# Patient Record
Sex: Male | Born: 1945 | Race: White | Hispanic: No | Marital: Married | Smoking: Former smoker
Health system: Southern US, Community
[De-identification: ages and names within clinical notes are randomized; demographics above are authoritative.]

## PROBLEM LIST (undated history)

## (undated) DIAGNOSIS — M1990 Unspecified osteoarthritis, unspecified site: Secondary | ICD-10-CM

## (undated) DIAGNOSIS — E785 Hyperlipidemia, unspecified: Secondary | ICD-10-CM

## (undated) DIAGNOSIS — Z9289 Personal history of other medical treatment: Secondary | ICD-10-CM

## (undated) DIAGNOSIS — R002 Palpitations: Secondary | ICD-10-CM

## (undated) DIAGNOSIS — I441 Atrioventricular block, second degree: Secondary | ICD-10-CM

## (undated) DIAGNOSIS — N401 Enlarged prostate with lower urinary tract symptoms: Secondary | ICD-10-CM

## (undated) DIAGNOSIS — I472 Ventricular tachycardia, unspecified: Secondary | ICD-10-CM

## (undated) DIAGNOSIS — F419 Anxiety disorder, unspecified: Secondary | ICD-10-CM

## (undated) DIAGNOSIS — G4700 Insomnia, unspecified: Secondary | ICD-10-CM

## (undated) DIAGNOSIS — Z87828 Personal history of other (healed) physical injury and trauma: Secondary | ICD-10-CM

## (undated) DIAGNOSIS — I480 Paroxysmal atrial fibrillation: Secondary | ICD-10-CM

## (undated) DIAGNOSIS — N138 Other obstructive and reflux uropathy: Secondary | ICD-10-CM

## (undated) DIAGNOSIS — I493 Ventricular premature depolarization: Secondary | ICD-10-CM

## (undated) DIAGNOSIS — I10 Essential (primary) hypertension: Secondary | ICD-10-CM

## (undated) DIAGNOSIS — Z9849 Cataract extraction status, unspecified eye: Secondary | ICD-10-CM

## (undated) DIAGNOSIS — S42009A Fracture of unspecified part of unspecified clavicle, initial encounter for closed fracture: Secondary | ICD-10-CM

## (undated) HISTORY — DX: Personal history of other (healed) physical injury and trauma: Z87.828

## (undated) HISTORY — DX: Unspecified osteoarthritis, unspecified site: M19.90

## (undated) HISTORY — PX: HERNIA REPAIR: SHX51

## (undated) HISTORY — DX: Ventricular premature depolarization: I49.3

## (undated) HISTORY — DX: Cataract extraction status, unspecified eye: Z98.49

## (undated) HISTORY — DX: Palpitations: R00.2

## (undated) HISTORY — DX: Fracture of unspecified part of unspecified clavicle, initial encounter for closed fracture: S42.009A

## (undated) HISTORY — DX: Hyperlipidemia, unspecified: E78.5

## (undated) HISTORY — DX: Benign prostatic hyperplasia with lower urinary tract symptoms: N40.1

## (undated) HISTORY — DX: Insomnia, unspecified: G47.00

## (undated) HISTORY — DX: Ventricular tachycardia, unspecified: I47.20

## (undated) HISTORY — DX: Personal history of other medical treatment: Z92.89

## (undated) HISTORY — PX: TONSILLECTOMY AND ADENOIDECTOMY: SHX28

## (undated) HISTORY — PX: RECONSTRUCTION OF NOSE: SHX2301

## (undated) HISTORY — DX: Benign prostatic hyperplasia with lower urinary tract symptoms: N13.8

## (undated) HISTORY — DX: Paroxysmal atrial fibrillation: I48.0

## (undated) HISTORY — DX: Essential (primary) hypertension: I10

## (undated) HISTORY — DX: Atrioventricular block, second degree: I44.1

## (undated) HISTORY — DX: Anxiety disorder, unspecified: F41.9

## (undated) HISTORY — PX: VARICOSE VEIN SURGERY: SHX832

---

## 2010-08-26 ENCOUNTER — Emergency Department: Payer: Self-pay | Admitting: Internal Medicine

## 2013-04-29 DIAGNOSIS — N411 Chronic prostatitis: Secondary | ICD-10-CM | POA: Insufficient documentation

## 2013-04-29 DIAGNOSIS — Z87438 Personal history of other diseases of male genital organs: Secondary | ICD-10-CM | POA: Insufficient documentation

## 2013-04-29 DIAGNOSIS — R339 Retention of urine, unspecified: Secondary | ICD-10-CM | POA: Insufficient documentation

## 2014-11-17 DIAGNOSIS — M1990 Unspecified osteoarthritis, unspecified site: Secondary | ICD-10-CM | POA: Insufficient documentation

## 2014-11-17 DIAGNOSIS — N400 Enlarged prostate without lower urinary tract symptoms: Secondary | ICD-10-CM | POA: Insufficient documentation

## 2014-11-17 DIAGNOSIS — G4700 Insomnia, unspecified: Secondary | ICD-10-CM | POA: Insufficient documentation

## 2014-11-17 DIAGNOSIS — E785 Hyperlipidemia, unspecified: Secondary | ICD-10-CM | POA: Insufficient documentation

## 2014-11-17 DIAGNOSIS — I10 Essential (primary) hypertension: Secondary | ICD-10-CM | POA: Insufficient documentation

## 2014-11-20 ENCOUNTER — Ambulatory Visit (INDEPENDENT_AMBULATORY_CARE_PROVIDER_SITE_OTHER): Payer: Medicare PPO | Admitting: Family Medicine

## 2014-11-20 ENCOUNTER — Encounter: Payer: Self-pay | Admitting: Family Medicine

## 2014-11-20 VITALS — BP 126/75 | HR 68 | Temp 97.5°F | Wt 173.0 lb

## 2014-11-20 DIAGNOSIS — F419 Anxiety disorder, unspecified: Secondary | ICD-10-CM | POA: Diagnosis not present

## 2014-11-20 DIAGNOSIS — E785 Hyperlipidemia, unspecified: Secondary | ICD-10-CM | POA: Diagnosis not present

## 2014-11-20 DIAGNOSIS — N138 Other obstructive and reflux uropathy: Secondary | ICD-10-CM

## 2014-11-20 DIAGNOSIS — I10 Essential (primary) hypertension: Secondary | ICD-10-CM | POA: Diagnosis not present

## 2014-11-20 DIAGNOSIS — Z5181 Encounter for therapeutic drug level monitoring: Secondary | ICD-10-CM | POA: Diagnosis not present

## 2014-11-20 DIAGNOSIS — N401 Enlarged prostate with lower urinary tract symptoms: Secondary | ICD-10-CM

## 2014-11-20 DIAGNOSIS — R7989 Other specified abnormal findings of blood chemistry: Secondary | ICD-10-CM | POA: Insufficient documentation

## 2014-11-20 DIAGNOSIS — M1990 Unspecified osteoarthritis, unspecified site: Secondary | ICD-10-CM

## 2014-11-20 NOTE — Assessment & Plan Note (Signed)
Eat fewer saturated fats; keep up healthier eating habits, check lipids today; may be able to use less statin if LDL improved

## 2014-11-20 NOTE — Assessment & Plan Note (Signed)
Check Ca2+ and vitamin D and phosphorous

## 2014-11-20 NOTE — Patient Instructions (Addendum)
Keep up the great job of managing your diet and weight Schedule an appointment to start working with a counselor for your anxiety Never mix alcohol or pain medicine with the alprazolam that Dr. Achilles Dunk gives you Limit eggs and saturated fats We'll contact you about the labs Return in 6 months  DASH Eating Plan DASH stands for "Dietary Approaches to Stop Hypertension." The DASH eating plan is a healthy eating plan that has been shown to reduce high blood pressure (hypertension). Additional health benefits may include reducing the risk of type 2 diabetes mellitus, heart disease, and stroke. The DASH eating plan may also help with weight loss. WHAT DO I NEED TO KNOW ABOUT THE DASH EATING PLAN? For the DASH eating plan, you will follow these general guidelines:  Choose foods with a percent daily value for sodium of less than 5% (as listed on the food label).  Use salt-free seasonings or herbs instead of table salt or sea salt.  Check with your health care provider or pharmacist before using salt substitutes.  Eat lower-sodium products, often labeled as "lower sodium" or "no salt added."  Eat fresh foods.  Eat more vegetables, fruits, and low-fat dairy products.  Choose whole grains. Look for the word "whole" as the first word in the ingredient list.  Choose fish and skinless chicken or [place] more often than red meat. Limit fish, poultry, and meat to 6 oz (170 g) each day.  Limit sweets, desserts, sugars, and sugary drinks.  Choose heart-healthy fats.  Limit cheese to 1 oz (28 g) per day.  Eat more home-cooked food and less restaurant, buffet, and fast food.  Limit fried foods.  Cook foods using methods other than frying.  Limit canned vegetables. If you do use them, rinse them well to decrease the sodium.  When eating at a restaurant, ask that your food be prepared with less salt, or no salt if possible. WHAT FOODS CAN I EAT? Seek help from a dietitian for individual calorie  needs. Grains Whole grain or whole wheat bread. Brown rice. Whole grain or whole wheat pasta. Quinoa, bulgur, and whole grain cereals. Low-sodium cereals. Corn or whole wheat flour tortillas. Whole grain cornbread. Whole grain crackers. Low-sodium crackers. Vegetables Fresh or frozen vegetables (raw, steamed, roasted, or grilled). Low-sodium or reduced-sodium tomato and vegetable juices. Low-sodium or reduced-sodium tomato sauce and paste. Low-sodium or reduced-sodium canned vegetables.  Fruits All fresh, canned (in natural juice), or frozen fruits. Meat and Other Protein Products Ground beef (85% or leaner), grass-fed beef, or beef trimmed of fat. Skinless chicken or [place]. Ground chicken or [place]. Pork trimmed of fat. All fish and seafood. Eggs. Dried beans, peas, or lentils. Unsalted nuts and seeds. Unsalted canned beans. Dairy Low-fat dairy products, such as skim or 1% milk, 2% or reduced-fat cheeses, low-fat ricotta or cottage cheese, or plain low-fat yogurt. Low-sodium or reduced-sodium cheeses. Fats and Oils Tub margarines without trans fats. Light or reduced-fat mayonnaise and salad dressings (reduced sodium). Avocado. Safflower, olive, or canola oils. Natural peanut or almond butter. Other Unsalted popcorn and pretzels. The items listed above may not be a complete list of recommended foods or beverages. Contact your dietitian for more options. WHAT FOODS ARE NOT RECOMMENDED? Grains White bread. White pasta. White rice. Refined cornbread. Bagels and croissants. Crackers that contain trans fat. Vegetables Creamed or fried vegetables. Vegetables in a cheese sauce. Regular canned vegetables. Regular canned tomato sauce and paste. Regular tomato and vegetable juices. Fruits Dried fruits. Canned fruit in light  or heavy syrup. Fruit juice. Meat and Other Protein Products Fatty cuts of meat. Ribs, chicken wings, bacon, sausage, bologna, salami, chitterlings, fatback, hot dogs, bratwurst,  and packaged luncheon meats. Salted nuts and seeds. Canned beans with salt. Dairy Whole or 2% milk, cream, half-and-half, and cream cheese. Whole-fat or sweetened yogurt. Full-fat cheeses or blue cheese. Nondairy creamers and whipped toppings. Processed cheese, cheese spreads, or cheese curds. Condiments Onion and garlic salt, seasoned salt, table salt, and sea salt. Canned and packaged gravies. Worcestershire sauce. Tartar sauce. Barbecue sauce. Teriyaki sauce. Soy sauce, including reduced sodium. Steak sauce. Fish sauce. Oyster sauce. Cocktail sauce. Horseradish. Ketchup and mustard. Meat flavorings and tenderizers. Bouillon cubes. Hot sauce. Tabasco sauce. Marinades. Taco seasonings. Relishes. Fats and Oils Butter, stick margarine, lard, shortening, ghee, and bacon fat. Coconut, palm kernel, or palm oils. Regular salad dressings. Other Pickles and olives. Salted popcorn and pretzels. The items listed above may not be a complete list of foods and beverages to avoid. Contact your dietitian for more information. WHERE CAN I FIND MORE INFORMATION? National [employer], [employer], [employer] Institute: travelstabloid.com Document Released: 04/07/2011 Document Revised: 09/02/2013 Document Reviewed: 02/20/2013 [facility] Patient Information 2015 [place], [place]. This information is not intended to replace advice given to you by your health care provider. Make sure you discuss any questions you have with your health care provider.

## 2014-11-20 NOTE — Progress Notes (Signed)
BP 126/75 mmHg  Pulse 68  Temp(Src) 97.5 F (36.4 C)  Wt 173 lb (78.472 kg)  SpO2 98%   Subjective:    Patient ID: Thomas [name], male    DOB: Oct 21, 1945, 69 y.o.   MRN: [id number]  HPI: Thomas [name] is a 69 y.o. male  Chief Complaint  Patient presents with  . Hypertension  . Hyperlipidemia   High blood pressure and high cholesterol He is eating better, walking, trying to lose weight He loves southern iced tea, but has cut down on that and is drinking more water Craves sweets, eating less sweets and more fruits Laying off of bread and fried foods; more grilled He has cut back on sodas a lot He does not use much salt Last labs were jan 21st On statin, does have some muscle aches and tiredness, but it's manageable Allergies, using claritin and that keeps it under control; using eye drops for itchiness Arthritis; has had 50 years of heating and plubming; lifting and wear and tear; using meloxicam every day; sometimes just half of a pill; left index PIP is his joint that helps him know how his arthritis is the rest of the body; tramadol once in a while When he was having prostate trouble, Dr. Achilles Dunk was prescribing alprazolam; not taking in a while; he was just down there in May and has a Rx; no alcohol Tramadol prescribed by the [place]; used to take hydrocodone, but got off of that Some issues with anxiety; hot and can't get out to do all he needs to do  Relevant past medical, surgical, family and social history reviewed and updated as indicated. Interim medical history since our last visit reviewed. Allergies and medications reviewed and updated.  Review of Systems  Per HPI unless specifically indicated above     Objective:    BP 126/75 mmHg  Pulse 68  Temp(Src) 97.5 F (36.4 C)  Wt 173 lb (78.472 kg)  SpO2 98%  Wt Readings from Last 3 Encounters:  11/20/14 173 lb (78.472 kg)  05/22/14 183 lb (83.008 kg)    Physical Exam  Constitutional: He appears well-developed and  well-nourished. No distress.  HENT:  Head: Normocephalic and atraumatic.  Eyes: EOM are normal. No scleral icterus.  Neck: No thyromegaly present.  Cardiovascular: Normal rate and regular rhythm.   Pulmonary/Chest: Effort normal and breath sounds normal.  Abdominal: Soft. Bowel sounds are normal. He exhibits no distension.  Musculoskeletal: He exhibits no edema.       Right hand: Decreased range of motion: left in.       Left hand: He exhibits decreased range of motion (left index finger PIP swollen, decreased ROM).  Neurological: Coordination normal.  Skin: Skin is warm and dry. No pallor.  Psychiatric: He has a normal mood and affect. His behavior is normal. Judgment and thought content normal.    No results found for this or any previous visit.    Assessment & Plan:   Problem List Items Addressed This Visit      Cardiovascular and Mediastinum   Hypertension - Primary    Well-controlled with current regimen and better eating      Relevant Medications   sildenafil (REVATIO) 20 MG tablet     Musculoskeletal and Integument   Arthritis    Using meloxicam for arthritis; tylenol per package directions; tramadol comes from the [place]      Relevant Medications   traMADol (ULTRAM) 50 MG tablet     Genitourinary  BPH (benign prostatic hypertrophy) with urinary obstruction    Managed by urologist        Other   Hyperlipidemia    Eat fewer saturated fats; keep up healthier eating habits, check lipids today; may be able to use less statin if LDL improved      Relevant Medications   sildenafil (REVATIO) 20 MG tablet   Other Relevant Orders   Lipid Panel w/o Chol/HDL Ratio   Anxiety    Try to maintain positive thinking; encouraged counseling, list of psychologists given      Relevant Medications   ALPRAZolam (XANAX) 0.5 MG tablet   Low serum calcium    Check Ca2+ and vitamin D and phosphorous      Relevant Orders   Vit D  25 hydroxy (rtn osteoporosis monitoring)    Phosphorus    Other Visit Diagnoses    Medication monitoring encounter        Relevant Orders    Comprehensive metabolic panel        Follow up plan: Return in about 6 months (around 05/23/2015) for cholesterol and BP.  An After Visit Summary was printed and given to the patient.  Orders Placed This Encounter  Procedures  . Comprehensive metabolic panel  . Lipid Panel w/o Chol/HDL Ratio  . Vit D  25 hydroxy (rtn osteoporosis monitoring)  . Phosphorus

## 2014-11-20 NOTE — Assessment & Plan Note (Signed)
Well-controlled with current regimen and better eating

## 2014-11-20 NOTE — Assessment & Plan Note (Signed)
Managed by urologist 

## 2014-11-20 NOTE — Assessment & Plan Note (Signed)
Try to maintain positive thinking; encouraged counseling, list of psychologists given

## 2014-11-20 NOTE — Assessment & Plan Note (Addendum)
Using meloxicam for arthritis; tylenol per package directions; tramadol comes from the [place]

## 2014-11-21 LAB — COMPREHENSIVE METABOLIC PANEL
ALT: 23 IU/L (ref 0–44)
AST: 24 IU/L (ref 0–40)
Albumin/Globulin Ratio: 1.5 (ref 1.1–2.5)
Albumin: 3.9 g/dL (ref 3.5–4.8)
Alkaline Phosphatase: 49 IU/L (ref 39–117)
BUN/Creatinine Ratio: 31 — ABNORMAL HIGH (ref 10–22)
BUN: 26 mg/dL (ref 8–27)
Bilirubin Total: 1.5 mg/dL — ABNORMAL HIGH (ref 0.0–1.2)
CO2: 25 mmol/L (ref 18–29)
Calcium: 9.3 mg/dL (ref 8.6–10.2)
Chloride: 95 mmol/L — ABNORMAL LOW (ref 97–108)
Creatinine, Ser: 0.84 mg/dL (ref 0.76–1.27)
GFR calc Af Amer: 103 mL/min/{1.73_m2} (ref >59)
GFR calc non Af Amer: 89 mL/min/{1.73_m2} (ref >59)
Globulin, Total: 2.6 g/dL (ref 1.5–4.5)
Glucose: 95 mg/dL (ref 65–99)
Potassium: 4.3 mmol/L (ref 3.5–5.2)
Sodium: 136 mmol/L (ref 134–144)
Total Protein: 6.5 g/dL (ref 6.0–8.5)

## 2014-11-21 LAB — LIPID PANEL W/O CHOL/HDL RATIO
Cholesterol, Total: 151 mg/dL (ref 100–199)
HDL: 39 mg/dL — ABNORMAL LOW (ref >39)
LDL Calculated: 93 mg/dL (ref 0–99)
Triglycerides: 97 mg/dL (ref 0–149)
VLDL Cholesterol Cal: 19 mg/dL (ref 5–40)

## 2014-11-21 LAB — PHOSPHORUS: Phosphorus: 2.5 mg/dL (ref 2.5–4.5)

## 2014-11-21 LAB — VITAMIN D 25 HYDROXY (VIT D DEFICIENCY, FRACTURES): Vit D, 25-Hydroxy: 49.3 ng/mL (ref 30.0–100.0)

## 2014-11-25 ENCOUNTER — Encounter: Payer: Self-pay | Admitting: Family Medicine

## 2014-12-08 ENCOUNTER — Encounter: Payer: Self-pay | Admitting: Family Medicine

## 2014-12-08 ENCOUNTER — Ambulatory Visit (INDEPENDENT_AMBULATORY_CARE_PROVIDER_SITE_OTHER): Payer: Medicare PPO | Admitting: Family Medicine

## 2014-12-08 VITALS — BP 126/71 | HR 66 | Temp 97.7°F | Wt 172.0 lb

## 2014-12-08 DIAGNOSIS — J0101 Acute recurrent maxillary sinusitis: Secondary | ICD-10-CM

## 2014-12-08 DIAGNOSIS — R300 Dysuria: Secondary | ICD-10-CM

## 2014-12-08 DIAGNOSIS — H6122 Impacted cerumen, left ear: Secondary | ICD-10-CM

## 2014-12-08 MED ORDER — SULFAMETHOXAZOLE-TRIMETHOPRIM 800-160 MG PO TABS: 1.0000 | ORAL_TABLET | Freq: Two times a day (BID) | ORAL | Status: DC

## 2014-12-08 NOTE — Progress Notes (Signed)
BP 126/71 mmHg  Pulse 66  Temp(Src) 97.7 F (36.5 C)  Wt 172 lb (78.019 kg)  SpO2 97%   Subjective:    Patient ID: Thomas [name], male    DOB: 1946/03/24, 69 y.o.   MRN: [id number]  HPI: Thomas [name] is a 69 y.o. male  Chief Complaint  Patient presents with  . Sinusitis   He has a hx of bad sinuses on the left; they did all these tests and ran all that up through to look; they talked about surgery but said if they can treat with antibiotics; the left eye stays watery; this happens with sinus infections; not really blowing out stuff; using saline solution every morning, keeping it under control; damp and humid weather; no ear problems; no sore throat; allergic to penicillin; he tolerates sulfa drugs  He is having some burning, thinks this sinus infection is affecting his prostate too; the infection is going through him; Dr. Orson Slick told him the prostate is a weak organ and infection will go through him; this has happened before  He just went to the dentist last Thursday and they checked him out thoroughly  Relevant past medical, surgical, family and social history reviewed and updated as indicated. Interim medical history since our last visit reviewed. Allergies and medications reviewed and updated.  Review of Systems  Per HPI unless specifically indicated above     Objective:    BP 126/71 mmHg  Pulse 66  Temp(Src) 97.7 F (36.5 C)  Wt 172 lb (78.019 kg)  SpO2 97%  Wt Readings from Last 3 Encounters:  12/08/14 172 lb (78.019 kg)  11/20/14 173 lb (78.472 kg)  05/22/14 183 lb (83.008 kg)    Physical Exam  Constitutional: He appears well-developed and well-nourished. No distress.  HENT:  Head: Normocephalic and atraumatic.  Right Ear: Hearing and external ear normal. No drainage. Tympanic membrane is not injected, not erythematous and not retracted. No middle ear effusion. No decreased hearing is noted.  Left Ear: Hearing and external ear normal. No decreased hearing  is noted.  Nose: No mucosal edema, rhinorrhea, sinus tenderness, nasal deformity or septal deviation. Right sinus exhibits no maxillary sinus tenderness and no frontal sinus tenderness. Left sinus exhibits no maxillary sinus tenderness and no frontal sinus tenderness.  Mouth/Throat: Uvula is midline, oropharynx is clear and moist and mucous membranes are normal. No oropharyngeal exudate, posterior oropharyngeal edema or posterior oropharyngeal erythema.  Left canal impacted with cerumen; brief attempt made to clear it out with curette under direct visualization, but too much hair in the canal obstructing view  Eyes: EOM are normal. Right eye exhibits no discharge. Left eye exhibits no discharge. No scleral icterus.  Cardiovascular: Normal rate and regular rhythm.   Pulmonary/Chest: Effort normal and breath sounds normal.  Abdominal: He exhibits no distension.  Neurological: He is alert.  Skin: Skin is warm and dry. No rash noted. He is not diaphoretic. No erythema.  Psychiatric: He has a normal mood and affect. His behavior is normal. Judgment and thought content normal.      Assessment & Plan:   Problem List Items Addressed This Visit    None    Visit Diagnoses    Acute recurrent maxillary sinusitis    -  Primary    atypical, but patient reports previous work-up by ENT and this is similar to previous sinus infections; start ABX; see AVS; call if not improving    Relevant Medications    sulfamethoxazole-trimethoprim (BACTRIM DS)  800-160 MG per tablet    Dysuria        pt reports similar sx with previous infections; he declined urinalysis; will start ABX; call or see urologist if sx persist; continue saw palmetto    Cerumen impaction, left        brief attempt to remove wax, but hair made process difficult; since hearing not affected and does not change plan, I did not irrigate or treat further       Follow up plan: Return if symptoms worsen or fail to improve.  Meds ordered this  encounter  Medications  . sulfamethoxazole-trimethoprim (BACTRIM DS) 800-160 MG per tablet    Sig: Take 1 tablet by mouth 2 (two) times daily.    Dispense:  20 tablet    Refill:  0   An after-visit summary was printed and given to the patient at check-out.  Please see the patient instructions which may contain other information and recommendations beyond what is mentioned above in the assessment and plan.

## 2014-12-08 NOTE — Patient Instructions (Addendum)
Continue the saline rinses/spray Start the antibiotics Call with any problems Stay well-hydrated and out of the sun for the next 10 days if possible If your urinary symptoms don't quickly resolve, please let us know or see your urologist   Sinusitis Sinusitis is redness, soreness, and inflammation of the paranasal sinuses. Paranasal sinuses are air pockets within the bones of your face (beneath the eyes, the middle of the forehead, or above the eyes). In healthy paranasal sinuses, mucus is able to drain out, and air is able to circulate through them by way of your nose. However, when your paranasal sinuses are inflamed, mucus and air can become trapped. This can allow bacteria and other germs to grow and cause infection. Sinusitis can develop quickly and last only a short time (acute) or continue over a long period (chronic). Sinusitis that lasts for more than 12 weeks is considered chronic.  CAUSES  Causes of sinusitis include:  Allergies.  Structural abnormalities, such as displacement of the cartilage that separates your nostrils (deviated septum), which can decrease the air flow through your nose and sinuses and affect sinus drainage.  Functional abnormalities, such as when the small hairs (cilia) that line your sinuses and help remove mucus do not work properly or are not present. SIGNS AND SYMPTOMS  Symptoms of acute and chronic sinusitis are the same. The primary symptoms are pain and pressure around the affected sinuses. Other symptoms include:  Upper toothache.  Earache.  Headache.  Bad breath.  Decreased sense of smell and taste.  A cough, which worsens when you are lying flat.  Fatigue.  Fever.  Thick drainage from your nose, which often is green and may contain pus (purulent).  Swelling and warmth over the affected sinuses. DIAGNOSIS  Your health care provider will perform a physical exam. During the exam, your health care provider may:  Look in your nose for  signs of abnormal growths in your nostrils (nasal polyps).  Tap over the affected sinus to check for signs of infection.  View the inside of your sinuses (endoscopy) using an imaging device that has a light attached (endoscope). If your health care provider suspects that you have chronic sinusitis, one or more of the following tests may be recommended:  Allergy tests.  Nasal culture. A sample of mucus is taken from your nose, sent to a lab, and screened for bacteria.  Nasal cytology. A sample of mucus is taken from your nose and examined by your health care provider to determine if your sinusitis is related to an allergy. TREATMENT  Most cases of acute sinusitis are related to a viral infection and will resolve on their own within 10 days. Sometimes medicines are prescribed to help relieve symptoms (pain medicine, decongestants, nasal steroid sprays, or saline sprays).  However, for sinusitis related to a bacterial infection, your health care provider will prescribe antibiotic medicines. These are medicines that will help kill the bacteria causing the infection.  Rarely, sinusitis is caused by a fungal infection. In theses cases, your health care provider will prescribe antifungal medicine. For some cases of chronic sinusitis, surgery is needed. Generally, these are cases in which sinusitis recurs more than 3 times per year, despite other treatments. HOME CARE INSTRUCTIONS   Drink plenty of water. Water helps thin the mucus so your sinuses can drain more easily.  Use a humidifier.  Inhale steam 3 to 4 times a day (for example, sit in the bathroom with the shower running).  Apply a warm, moist  washcloth to your face 3 to 4 times a day, or as directed by your health care provider.  Use saline nasal sprays to help moisten and clean your sinuses.  Take medicines only as directed by your health care provider.  If you were prescribed either an antibiotic or antifungal medicine, finish it all  even if you start to feel better. SEEK IMMEDIATE MEDICAL CARE IF:  You have increasing pain or severe headaches.  You have nausea, vomiting, or drowsiness.  You have swelling around your face.  You have vision problems.  You have a stiff neck.  You have difficulty breathing. MAKE SURE YOU:   Understand these instructions.  Will watch your condition.  Will get help right away if you are not doing well or get worse. Document Released: 04/18/2005 Document Revised: 09/02/2013 Document Reviewed: 05/03/2011 [facility] Patient Information 2015 [place], [place]. This information is not intended to replace advice given to you by your health care provider. Make sure you discuss any questions you have with your health care provider.

## 2015-01-20 ENCOUNTER — Encounter: Payer: Self-pay | Admitting: Family Medicine

## 2015-01-20 DIAGNOSIS — Z5181 Encounter for therapeutic drug level monitoring: Secondary | ICD-10-CM | POA: Insufficient documentation

## 2015-05-26 ENCOUNTER — Ambulatory Visit
Admission: RE | Admit: 2015-05-26 | Discharge: 2015-05-26 | Disposition: A | Discharge status: Home / Self Care | Payer: Medicare PPO | Source: Ambulatory Visit | Attending: Family Medicine | Admitting: Family Medicine

## 2015-05-26 ENCOUNTER — Ambulatory Visit (INDEPENDENT_AMBULATORY_CARE_PROVIDER_SITE_OTHER): Payer: Medicare PPO | Admitting: Family Medicine

## 2015-05-26 ENCOUNTER — Encounter: Payer: Self-pay | Admitting: Family Medicine

## 2015-05-26 VITALS — BP 161/78 | HR 66 | Temp 97.0°F | Ht 67.75 in | Wt 173.0 lb

## 2015-05-26 DIAGNOSIS — M1990 Unspecified osteoarthritis, unspecified site: Secondary | ICD-10-CM

## 2015-05-26 DIAGNOSIS — Z1159 Encounter for screening for other viral diseases: Secondary | ICD-10-CM | POA: Diagnosis not present

## 2015-05-26 DIAGNOSIS — E785 Hyperlipidemia, unspecified: Secondary | ICD-10-CM | POA: Diagnosis not present

## 2015-05-26 DIAGNOSIS — I10 Essential (primary) hypertension: Secondary | ICD-10-CM

## 2015-05-26 DIAGNOSIS — Z5181 Encounter for therapeutic drug level monitoring: Secondary | ICD-10-CM | POA: Diagnosis not present

## 2015-05-26 DIAGNOSIS — M25542 Pain in joints of left hand: Secondary | ICD-10-CM | POA: Insufficient documentation

## 2015-05-26 MED ORDER — MELOXICAM 15 MG PO TABS: 15.0000 mg | ORAL_TABLET | Freq: Every day | ORAL | Status: DC | PRN

## 2015-05-26 MED ORDER — ATORVASTATIN CALCIUM 10 MG PO TABS: 10.0000 mg | ORAL_TABLET | Freq: Every day | ORAL | Status: DC

## 2015-05-26 NOTE — Assessment & Plan Note (Signed)
liimit sat fats; check lipids today and continue statin

## 2015-05-26 NOTE — Assessment & Plan Note (Signed)
Left index finger enlarged at the PIP; positive family hx of RA and will check labs and xray today

## 2015-05-26 NOTE — Assessment & Plan Note (Signed)
Controlled at home; continue same regimen; try to follow DASH guidelines; monitor at home and contact if not to goal

## 2015-05-26 NOTE — Patient Instructions (Addendum)
Your goal blood pressure is less than 150 mmHg on top. Try to follow the DASH guidelines (DASH stands for Dietary Approaches to Stop Hypertension) Try to limit the sodium in your diet.  Ideally, consume less than 1.5 grams (less than 1,500mg ) per day. Do not add salt when cooking or at the table.  Check the sodium amount on labels when shopping, and choose items lower in sodium when given a choice. Avoid or limit foods that already contain a lot of sodium. Eat a diet rich in fruits and vegetables and whole grains.  Try to limit saturated fats in your diet (bologna, hot dogs, barbeque, cheeseburgers, hamburgers, steak, bacon, sausage, cheese, etc.) and get more fresh fruits, vegetables, and whole grains  You can have the xray done at [facility] today without an appointment We'll contact you about your labs and xray results

## 2015-05-26 NOTE — Progress Notes (Signed)
BP 161/78 mmHg  Pulse 66  Temp(Src) 97 F (36.1 C)  Ht 5' 7.75" (1.721 m)  Wt 173 lb (78.472 kg)  BMI 26.49 kg/m2  SpO2 100%   Subjective:    Patient ID: Thomas [name], male    DOB: 1945-06-20, 70 y.o.   MRN: [id number]  HPI: Thomas [name] is a 70 y.o. male  Chief Complaint  Patient presents with  . Hypertension    routine follow up and labs  . Hyperlipidemia    routine follow up and labs  . 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 Interim medical history since our last visit reviewed.  Allergies and medications reviewed and updated.  Current outpatient prescriptions:  .  aspirin EC 81 MG tablet, Take 81 mg by mouth daily., Disp: , Rfl:  .  atenolol (TENORMIN) 25 MG tablet, Take 25 mg by mouth daily., Disp: , Rfl:  .  atorvastatin (LIPITOR) 10 MG tablet, Take 1 tablet (10 mg total) by mouth at bedtime., Disp: 90 tablet, Rfl: 1 .  fluticasone (FLONASE) 50 MCG/ACT nasal spray, Place 2 sprays into both nostrils daily., Disp: , Rfl:  .  hydrochlorothiazide (HYDRODIURIL) 25 MG tablet, Take 25 mg by mouth daily. , Disp: , Rfl:  .  ketotifen (ZADITOR) 0.025 % ophthalmic solution, Place 1 drop into both eyes 2 (two)  times daily., Disp: , Rfl:  .  loratadine (CLARITIN) 10 MG tablet, Take 10 mg by mouth daily., Disp: , Rfl:  .  meloxicam (MOBIC) 15 MG tablet, Take 1 tablet (15 mg total) by mouth daily as needed for pain. Take your aspirin at least one hour BEFORE this med, Disp: 90 tablet, Rfl: 1 .  traMADol (ULTRAM) 50 MG tablet, Take 50 mg by mouth 2 (two) times daily as needed. , Disp: , Rfl:   Review of Systems  Cardiovascular: Negative for chest pain.  Musculoskeletal: Positive for joint swelling (left index finger gets swollen and red and hot).   Per HPI unless specifically indicated above     Objective:    BP 161/78 mmHg  Pulse 66  Temp(Src) 97 F (36.1 C)  Ht 5' 7.75" (1.721 m)  Wt 173 lb (78.472 kg)  BMI 26.49 kg/m2  SpO2 100%  Wt Readings from Last 3 Encounters:  05/26/15 173 lb (78.472 kg)  12/08/14 172 lb (78.019 kg)  11/20/14 173 lb (78.472 kg)    Physical Exam  Constitutional: He appears well-developed and well-nourished. No distress.  HENT:  Head: Normocephalic and atraumatic.  Eyes: EOM are normal. No scleral icterus.  Neck: No thyromegaly present.  Cardiovascular: Normal rate and regular rhythm.   Pulmonary/Chest: Effort normal and breath sounds normal.  Abdominal: Soft. Bowel sounds are normal. He exhibits no distension.  Musculoskeletal: He exhibits no edema.       Left hand: He exhibits decreased range of motion, tenderness and bony tenderness (enlarged PIP joint of the 2nd index finger).  Neurological: Coordination normal.  Skin: Skin is warm and dry. No pallor.  Psychiatric: He has a normal mood and affect. His behavior is normal. Judgment and thought content normal.    Results for orders placed or performed in visit on 11/20/14  Comprehensive metabolic panel  Result Value Ref Range   Glucose 95 65 - 99 mg/dL   BUN 26 8 - 27 mg/dL   Creatinine, Ser 1.61 0.76 - 1.27 mg/dL   GFR calc non Af Amer 89 >59 mL/min/1.73   GFR calc Af Amer 103 >59 mL/min/1.73    BUN/Creatinine Ratio 31 (H) 10 - 22   Sodium 136 134 - 144 mmol/L   Potassium 4.3 3.5 - 5.2 mmol/L   Chloride 95 (L) 97 - 108 mmol/L   CO2 25 18 - 29 mmol/L   Calcium 9.3 8.6 - 10.2 mg/dL   Total Protein 6.5 6.0 - 8.5 g/dL   Albumin 3.9 3.5 - 4.8 g/dL   Globulin, Total 2.6 1.5 - 4.5 g/dL   Albumin/Globulin Ratio 1.5 1.1 - 2.5   Bilirubin Total 1.5 (H) 0.0 - 1.2 mg/dL   Alkaline Phosphatase 49 39 - 117 IU/L   AST 24 0 - 40 IU/L   ALT 23 0 - 44 IU/L  Lipid Panel w/o Chol/HDL Ratio  Result Value Ref Range   Cholesterol, Total 151 100 - 199 mg/dL   Triglycerides 97 0 - 149 mg/dL   HDL 39 (L) >09 mg/dL   VLDL Cholesterol Cal 19 5 - 40 mg/dL   LDL Calculated 93 0 - 99 mg/dL  Vit D  25 hydroxy (rtn osteoporosis monitoring)  Result Value Ref Range   Vit D, 25-Hydroxy 49.3 30.0 - 100.0 ng/mL  Phosphorus  Result Value Ref Range   Phosphorus 2.5 2.5 - 4.5 mg/dL      Assessment & Plan:   Problem List Items Addressed This Visit      Cardiovascular and Mediastinum   Hypertension - Primary    Controlled at home; continue same regimen; try to follow DASH guidelines; monitor at home and contact if not to goal      Relevant Medications   atorvastatin (LIPITOR) 10 MG tablet   aspirin EC 81 MG tablet     Musculoskeletal and Integument   Arthritis    Left index finger enlarged at the PIP; positive family hx of RA and will check labs and xray today      Relevant Medications   meloxicam (MOBIC) 15 MG tablet   aspirin EC 81 MG tablet   Other Relevant Orders   ANA w/Reflex if Positive   DG Finger Index Left     Other   Hyperlipidemia    liimit sat fats; check lipids today and continue statin      Relevant Medications   atorvastatin (LIPITOR) 10 MG tablet   aspirin EC 81 MG tablet   Other Relevant Orders   Lipid Panel w/o Chol/HDL Ratio   Medication monitoring encounter    Monitor electorlytes and creatinine on the thiazide; monitor SGPT on statin; monitor CBC on the NSAID       Relevant Orders   Comprehensive metabolic panel   CBC with Differential/Platelet   Need for hepatitis C screening test  Relevant Orders   Hepatitis C antibody      Follow up plan: Return in about 6 months (around 11/23/2015) for thirty minute follow-up with fasting labs.  An after-visit summary was printed and given to the patient at check-out.  Please see the patient instructions which may contain other information and recommendations beyond what is mentioned above in the assessment and plan. Orders Placed This Encounter  Procedures  . DG Finger Index Left  . Hepatitis C antibody  . ANA w/Reflex if Positive  . Comprehensive metabolic panel  . Lipid Panel w/o Chol/HDL Ratio  . CBC with Differential/Platelet   Meds ordered this encounter  Medications  . ketotifen (ZADITOR) 0.025 % ophthalmic solution    Sig: Place 1 drop into both eyes 2 (two) times daily.  . fluticasone (FLONASE) 50 MCG/ACT nasal spray    Sig: Place 2 sprays into both nostrils daily.  . meloxicam (MOBIC) 15 MG tablet    Sig: Take 1 tablet (15 mg total) by mouth daily as needed for pain. Take your aspirin at least one hour BEFORE this med    Dispense:  90 tablet    Refill:  1  . atorvastatin (LIPITOR) 10 MG tablet    Sig: Take 1 tablet (10 mg total) by mouth at bedtime.    Dispense:  90 tablet    Refill:  1  . aspirin EC 81 MG tablet    Sig: Take 81 mg by mouth daily.

## 2015-05-26 NOTE — Assessment & Plan Note (Signed)
Monitor electorlytes and creatinine on the thiazide; monitor SGPT on statin; monitor CBC on the NSAID

## 2015-05-27 ENCOUNTER — Encounter: Payer: Self-pay | Admitting: Family Medicine

## 2015-05-27 LAB — COMPREHENSIVE METABOLIC PANEL
ALT: 30 IU/L (ref 0–44)
AST: 24 IU/L (ref 0–40)
Albumin/Globulin Ratio: 1.4 (ref 1.1–2.5)
Albumin: 4 g/dL (ref 3.5–4.8)
Alkaline Phosphatase: 50 IU/L (ref 39–117)
BUN/Creatinine Ratio: 20 (ref 10–22)
BUN: 20 mg/dL (ref 8–27)
Bilirubin Total: 1.2 mg/dL (ref 0.0–1.2)
CO2: 27 mmol/L (ref 18–29)
Calcium: 9.6 mg/dL (ref 8.6–10.2)
Chloride: 97 mmol/L (ref 96–106)
Creatinine, Ser: 1 mg/dL (ref 0.76–1.27)
GFR calc Af Amer: 87 mL/min/{1.73_m2} (ref >59)
GFR calc non Af Amer: 75 mL/min/{1.73_m2} (ref >59)
Globulin, Total: 2.9 g/dL (ref 1.5–4.5)
Glucose: 97 mg/dL (ref 65–99)
Potassium: 4.3 mmol/L (ref 3.5–5.2)
Sodium: 138 mmol/L (ref 134–144)
Total Protein: 6.9 g/dL (ref 6.0–8.5)

## 2015-05-27 LAB — CBC WITH DIFFERENTIAL/PLATELET
Basophils Absolute: 0 10*3/uL (ref 0.0–0.2)
Basos: 0 %
EOS (ABSOLUTE): 0.3 10*3/uL (ref 0.0–0.4)
Eos: 5 %
Hematocrit: 43.4 % (ref 37.5–51.0)
Hemoglobin: 15 g/dL (ref 12.6–17.7)
Immature Grans (Abs): 0 10*3/uL (ref 0.0–0.1)
Immature Granulocytes: 0 %
Lymphocytes Absolute: 1.7 10*3/uL (ref 0.7–3.1)
Lymphs: 27 %
MCH: 31.8 pg (ref 26.6–33.0)
MCHC: 34.6 g/dL (ref 31.5–35.7)
MCV: 92 fL (ref 79–97)
Monocytes Absolute: 0.6 10*3/uL (ref 0.1–0.9)
Monocytes: 9 %
Neutrophils Absolute: 3.7 10*3/uL (ref 1.4–7.0)
Neutrophils: 59 %
Platelets: 221 10*3/uL (ref 150–379)
RBC: 4.71 x10E6/uL (ref 4.14–5.80)
RDW: 13.5 % (ref 12.3–15.4)
WBC: 6.3 10*3/uL (ref 3.4–10.8)

## 2015-05-27 LAB — LIPID PANEL W/O CHOL/HDL RATIO
Cholesterol, Total: 141 mg/dL (ref 100–199)
HDL: 44 mg/dL (ref >39)
LDL Calculated: 80 mg/dL (ref 0–99)
Triglycerides: 87 mg/dL (ref 0–149)
VLDL Cholesterol Cal: 17 mg/dL (ref 5–40)

## 2015-05-27 LAB — ANA W/REFLEX IF POSITIVE: Anti Nuclear Antibody(ANA): NEGATIVE

## 2015-05-27 LAB — HEPATITIS C ANTIBODY: Hep C Virus Ab: <0.1 s/co ratio (ref 0.0–0.9)

## 2015-09-21 ENCOUNTER — Encounter: Payer: Self-pay | Admitting: Emergency Medicine

## 2015-09-21 ENCOUNTER — Emergency Department
Admission: EM | Admit: 2015-09-21 | Discharge: 2015-09-21 | Disposition: A | Discharge status: Home / Self Care | Payer: Medicare PPO | Attending: Emergency Medicine | Admitting: Emergency Medicine

## 2015-09-21 DIAGNOSIS — I10 Essential (primary) hypertension: Secondary | ICD-10-CM | POA: Insufficient documentation

## 2015-09-21 DIAGNOSIS — M1990 Unspecified osteoarthritis, unspecified site: Secondary | ICD-10-CM | POA: Insufficient documentation

## 2015-09-21 DIAGNOSIS — Z791 Long term (current) use of non-steroidal anti-inflammatories (NSAID): Secondary | ICD-10-CM | POA: Diagnosis not present

## 2015-09-21 DIAGNOSIS — E860 Dehydration: Secondary | ICD-10-CM | POA: Insufficient documentation

## 2015-09-21 DIAGNOSIS — R42 Dizziness and giddiness: Secondary | ICD-10-CM

## 2015-09-21 DIAGNOSIS — Z87891 Personal history of nicotine dependence: Secondary | ICD-10-CM | POA: Insufficient documentation

## 2015-09-21 DIAGNOSIS — E785 Hyperlipidemia, unspecified: Secondary | ICD-10-CM | POA: Insufficient documentation

## 2015-09-21 DIAGNOSIS — Z7982 Long term (current) use of aspirin: Secondary | ICD-10-CM | POA: Insufficient documentation

## 2015-09-21 LAB — URINALYSIS COMPLETE WITH MICROSCOPIC (ARMC ONLY)
Bacteria, UA: NONE SEEN
Bilirubin Urine: NEGATIVE
Glucose, UA: NEGATIVE mg/dL
Hgb urine dipstick: NEGATIVE
Leukocytes, UA: NEGATIVE
Nitrite: NEGATIVE
Protein, ur: NEGATIVE mg/dL
Specific Gravity, Urine: 1.023 (ref 1.005–1.030)
Squamous Epithelial / HPF: NONE SEEN
pH: 5 (ref 5.0–8.0)

## 2015-09-21 LAB — COMPREHENSIVE METABOLIC PANEL
ALT: 21 U/L (ref 17–63)
AST: 29 U/L (ref 15–41)
Albumin: 3.8 g/dL (ref 3.5–5.0)
Alkaline Phosphatase: 60 U/L (ref 38–126)
Anion gap: 9 (ref 5–15)
BUN: 24 mg/dL — ABNORMAL HIGH (ref 6–20)
CO2: 26 mmol/L (ref 22–32)
Calcium: 9.3 mg/dL (ref 8.9–10.3)
Chloride: 103 mmol/L (ref 101–111)
Creatinine, Ser: 0.98 mg/dL (ref 0.61–1.24)
GFR calc Af Amer: >60 mL/min (ref >60)
GFR calc non Af Amer: >60 mL/min (ref >60)
Glucose, Bld: 137 mg/dL — ABNORMAL HIGH (ref 65–99)
Potassium: 3.5 mmol/L (ref 3.5–5.1)
Sodium: 138 mmol/L (ref 135–145)
Total Bilirubin: 0.8 mg/dL (ref 0.3–1.2)
Total Protein: 7.4 g/dL (ref 6.5–8.1)

## 2015-09-21 LAB — CBC
HCT: 41.7 % (ref 40.0–52.0)
Hemoglobin: 14.3 g/dL (ref 13.0–18.0)
MCH: 31 pg (ref 26.0–34.0)
MCHC: 34.2 g/dL (ref 32.0–36.0)
MCV: 90.6 fL (ref 80.0–100.0)
Platelets: 180 10*3/uL (ref 150–440)
RBC: 4.61 MIL/uL (ref 4.40–5.90)
RDW: 13.3 % (ref 11.5–14.5)
WBC: 7.6 10*3/uL (ref 3.8–10.6)

## 2015-09-21 LAB — TROPONIN I: Troponin I: 0.03 ng/mL (ref <0.031)
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
## 2015-09-21 NOTE — Discharge Instructions (Signed)
Dehydration, Adult Dehydration is a condition in which you do not have enough fluid or water in your body. It happens when you take in less fluid than you lose. Vital organs such as the kidneys, brain, and heart cannot function without a proper amount of fluids. Any loss of fluids from the body can cause dehydration.  Dehydration can range from mild to severe. This condition should be treated right away to help prevent it from becoming severe. CAUSES  This condition may be caused by:  Vomiting.  Diarrhea.  Excessive sweating, such as when exercising in hot or humid weather.  Not drinking enough fluid during strenuous exercise or during an illness.  Excessive urine output.  Fever.  Certain medicines. RISK FACTORS This condition is more likely to develop in:  People who are taking certain medicines that cause the body to lose excess fluid (diuretics).   People who have a chronic illness, such as diabetes, that may increase urination.  Older adults.   People who live at high altitudes.   People who participate in endurance sports.  SYMPTOMS  Mild Dehydration  Thirst.  Dry lips.  Slightly dry mouth.  Dry, warm skin. Moderate Dehydration  Very dry mouth.   Muscle cramps.   Dark urine and decreased urine production.   Decreased tear production.   Headache.   Light-headedness, especially when you stand up from a sitting position.  Severe Dehydration  Changes in skin.   Cold and clammy skin.   Skin does not spring back quickly when lightly pinched and released.   Changes in body fluids.   Extreme thirst.   No tears.   Not able to sweat when body temperature is high, such as in hot weather.   Minimal urine production.   Changes in vital signs.   Rapid, weak pulse (more than 100 beats per minute when you are sitting still).   Rapid breathing.   Low blood pressure.   Other changes.   Sunken eyes.   Cold hands and feet.    Confusion.  Lethargy and difficulty being awakened.  Fainting (syncope).   Short-term weight loss.   Unconsciousness. DIAGNOSIS  This condition may be diagnosed based on your symptoms. You may also have tests to determine how severe your dehydration is. These tests may include:   Urine tests.   Blood tests.  TREATMENT  Treatment for this condition depends on the severity. Mild or moderate dehydration can often be treated at home. Treatment should be started right away. Do not wait until dehydration becomes severe. Severe dehydration needs to be treated at the hospital. Treatment for Mild Dehydration  Drinking plenty of water to replace the fluid you have lost.   Replacing minerals in your blood (electrolytes) that you may have lost.  Treatment for Moderate Dehydration  Consuming oral rehydration solution (ORS). Treatment for Severe Dehydration  Receiving fluid through an IV tube.   Receiving electrolyte solution through a feeding tube that is passed through your nose and into your stomach (nasogastric tube or NG tube).  Correcting any abnormalities in electrolytes. HOME CARE INSTRUCTIONS   Drink enough fluid to keep your urine clear or pale yellow.   Drink water or fluid slowly by taking small sips. You can also try sucking on ice cubes.  Have food or beverages that contain electrolytes. Examples include bananas and sports drinks.  Take over-the-counter and prescription medicines only as told by your health care provider.   Prepare ORS according to the manufacturer's instructions. Take sips  of ORS every 5 minutes until your urine returns to normal.  If you have vomiting or diarrhea, continue to try to drink water, ORS, or both.   If you have diarrhea, avoid:   Beverages that contain caffeine.   Fruit juice.   Milk.   Carbonated soft drinks.  Do not take salt tablets. This can lead to the condition of having too much sodium in your body  (hypernatremia).  SEEK MEDICAL CARE IF:  You cannot eat or drink without vomiting.  You have had moderate diarrhea during a period of more than 24 hours.  You have a fever. SEEK IMMEDIATE MEDICAL CARE IF:   You have extreme thirst.  You have severe diarrhea.  You have not urinated in 6-8 hours, or you have urinated only a small amount of very dark urine.  You have shriveled skin.  You are dizzy, confused, or both.   This information is not intended to replace advice given to you by your health care provider. Make sure you discuss any questions you have with your health care provider.   Document Released: 04/18/2005 Document Revised: 01/07/2015 Document Reviewed: 09/03/2014 Elsevier Interactive Patient Education 2016 Elsevier Inc.  Dizziness Dizziness is a common problem. It is a feeling of unsteadiness or light-headedness. You may feel like you are about to faint. Dizziness can lead to injury if you stumble or fall. Anyone can become dizzy, but dizziness is more common in older adults. This condition can be caused by a number of things, including medicines, dehydration, or illness. HOME CARE INSTRUCTIONS Taking these steps may help with your condition: Eating and Drinking  Drink enough fluid to keep your urine clear or pale yellow. This helps to keep you from becoming dehydrated. Try to drink more clear fluids, such as water.  Do not drink alcohol.  Limit your caffeine intake if directed by your health care provider.  Limit your salt intake if directed by your health care provider. Activity  Avoid making quick movements.  Rise slowly from chairs and steady yourself until you feel okay.  In the morning, first sit up on the side of the bed. When you feel okay, stand slowly while you hold onto something until you know that your balance is fine.  Move your legs often if you need to stand in one place for a long time. Tighten and relax your muscles in your legs while you  are standing.  Do not drive or operate heavy machinery if you feel dizzy.  Avoid bending down if you feel dizzy. Place items in your home so that they are easy for you to reach without leaning over. Lifestyle  Do not use any tobacco products, including cigarettes, chewing tobacco, or electronic cigarettes. If you need help quitting, ask your health care provider.  Try to reduce your stress level, such as with yoga or meditation. Talk with your health care provider if you need help. General Instructions  Watch your dizziness for any changes.  Take medicines only as directed by your health care provider. Talk with your health care provider if you think that your dizziness is caused by a medicine that you are taking.  Tell a friend or a family member that you are feeling dizzy. If he or she notices any changes in your behavior, have this person call your health care provider.  Keep all follow-up visits as directed by your health care provider. This is important. SEEK MEDICAL CARE IF:  Your dizziness does not go away.  Your dizziness or light-headedness gets worse.  You feel nauseous.  You have reduced hearing.  You have new symptoms.  You are unsteady on your feet or you feel like the room is spinning. SEEK IMMEDIATE MEDICAL CARE IF:  You vomit or have diarrhea and are unable to eat or drink anything.  You have problems talking, walking, swallowing, or using your arms, hands, or legs.  You feel generally weak.  You are not thinking clearly or you have trouble forming sentences. It may take a friend or family member to notice this.  You have chest pain, abdominal pain, shortness of breath, or sweating.  Your vision changes.  You notice any bleeding.  You have a headache.  You have neck pain or a stiff neck.  You have a fever.   This information is not intended to replace advice given to you by your health care provider. Make sure you discuss any questions you have  with your health care provider.   Document Released: 10/12/2000 Document Revised: 09/02/2014 Document Reviewed: 04/14/2014 Elsevier Interactive Patient Education 2016 Elsevier Inc.  Rehydration, Adult Rehydration is the replacement of body fluids lost during dehydration. Dehydration is an extreme loss of body fluids to the point of body function impairment. There are many ways extreme fluid loss can occur, including vomiting, diarrhea, or excess sweating. Recovering from dehydration requires replacing lost fluids, continuing to eat to maintain strength, and avoiding foods and beverages that may contribute to further fluid loss or may increase nausea. HOW TO REHYDRATE In most cases, rehydration involves the replacement of not only fluids but also carbohydrates and basic body salts. Rehydration with an oral rehydration solution is one way to replace essential nutrients lost through dehydration. An oral rehydration solution can be purchased at pharmacies, retail stores, and online. Premixed packets of powder that you combine with water to make a solution are also sold. You can prepare an oral rehydration solution at home by mixing the following ingredients together:    - tsp table salt.   tsp baking soda.   tsp salt substitute containing potassium chloride.  1 tablespoons sugar.  1 L (34 oz) of water. Be sure to use exact measurements. Including too much sugar can make diarrhea worse. Drink -1 cup (120-240 mL) of oral rehydration solution each time you have diarrhea or vomit. If drinking this amount makes your vomiting worse, try drinking smaller amounts more often. For example, drink 1-3 tsp every 5-10 minutes.  A general rule for staying hydrated is to drink 1-2 L of fluid per day. Talk to your caregiver about the specific amount you should be drinking each day. Drink enough fluids to keep your urine clear or pale yellow. EATING WHEN DEHYDRATED Even if you have had severe sweating or you  are having diarrhea, do not stop eating. Many healthy items in a normal diet are okay to continue eating while recovering from dehydration. The following tips can help you to lessen nausea when you eat:  Ask someone else to prepare your food. Cooking smells may worsen nausea.  Eat in a well-ventilated room away from cooking smells.  Sit up when you eat. Avoid lying down until 1-2 hours after eating.  Eat small amounts when you eat.  Eat foods that are easy to digest. These include soft, well-cooked, or mashed foods. FOODS AND BEVERAGES TO AVOID Avoid eating or drinking the following foods and beverages that may increase nausea or further loss of fluid:   Fruit juices with a high  sugar content, such as concentrated juices.  Alcohol.  Beverages containing caffeine.  Carbonated drinks. They may cause a lot of gas.  Foods that may cause a lot of gas, such as cabbage, broccoli, and beans.  Fatty, greasy, and fried foods.  Spicy, very salty, and very sweet foods or drinks.  Foods or drinks that are very hot or very cold. Consume food or drinks at or near room temperature.  Foods that need a lot of chewing, such as raw vegetables.  Foods that are sticky or hard to swallow, such as peanut butter.   This information is not intended to replace advice given to you by your health care provider. Make sure you discuss any questions you have with your health care provider.   Document Released: 07/11/2011 Document Revised: 01/11/2012 Document Reviewed: 07/11/2011 Elsevier Interactive Patient Education [employer] Elsevier Inc.

## 2015-09-21 NOTE — ED Notes (Signed)
States dizzyness began yesterday. Denies chest pain. States feels weak without focal weakness.

## 2015-09-23 ENCOUNTER — Telehealth: Payer: Self-pay | Admitting: Family Medicine

## 2015-09-23 DIAGNOSIS — I499 Cardiac arrhythmia, unspecified: Secondary | ICD-10-CM | POA: Insufficient documentation

## 2015-09-23 NOTE — Telephone Encounter (Signed)
I'll refer him to a cardiologist; have him go back to ER or call 911 for any heart symptoms, chest pressure, heart racing, etc.

## 2015-09-23 NOTE — Telephone Encounter (Signed)
Patient went to ER on Monday due to being dizzy (which has been going on for about 2 weeks) and also heart was skipping. Did blood work and was not able to find anything. Was told to contact his primary doctor. You do not have anything available until 10-06-15 please advise. It is okay to leave detailed message on machine.

## 2015-10-12 ENCOUNTER — Ambulatory Visit: Payer: Medicare PPO | Admitting: Cardiology

## 2015-10-15 ENCOUNTER — Ambulatory Visit (INDEPENDENT_AMBULATORY_CARE_PROVIDER_SITE_OTHER): Payer: Medicare PPO | Admitting: Cardiology

## 2015-10-15 ENCOUNTER — Encounter: Payer: Self-pay | Admitting: Cardiology

## 2015-10-15 VITALS — BP 150/80 | HR 68 | Ht 69.0 in | Wt 174.0 lb

## 2015-10-15 DIAGNOSIS — R002 Palpitations: Secondary | ICD-10-CM | POA: Diagnosis not present

## 2015-10-15 DIAGNOSIS — I10 Essential (primary) hypertension: Secondary | ICD-10-CM | POA: Diagnosis not present

## 2015-10-15 DIAGNOSIS — E785 Hyperlipidemia, unspecified: Secondary | ICD-10-CM
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
## 2015-10-15 NOTE — Patient Instructions (Addendum)
Medication Instructions:  Your physician recommends that you continue on your current medications as directed. Please refer to the Current Medication list given to you today.   Labwork: None ordered  Testing/Procedures: Your physician has requested that you have an echocardiogram. Echocardiography is a painless test that uses sound waves to create images of your heart. It provides your doctor with information about the size and shape of your heart and how well your heart's chambers and valves are working. This procedure takes approximately one hour. There are no restrictions for this procedure.  Date & Time: __________________________________________________________  Your physician has recommended that you wear a holter monitor. Holter monitors are medical devices that record the heart's electrical activity. Doctors most often use these monitors to diagnose arrhythmias. Arrhythmias are problems with the speed or rhythm of the heartbeat. The monitor is a small, portable device. You can wear one while you do your normal daily activities. This is usually used to diagnose what is causing palpitations/syncope (passing out).  Date & Time: __________________________________________________________  Follow-Up: Your physician recommends that you schedule a follow-up appointment after testing to review results.  Date & Time: ___________________________________________________________   It was a pleasure seeing you today here in the office. Please do not hesitate to give us a call back if you have any further questions. [phone]

## 2015-11-04 ENCOUNTER — Other Ambulatory Visit: Payer: Self-pay

## 2015-11-04 ENCOUNTER — Ambulatory Visit (INDEPENDENT_AMBULATORY_CARE_PROVIDER_SITE_OTHER): Payer: Medicare PPO

## 2015-11-04 DIAGNOSIS — R002 Palpitations: Secondary | ICD-10-CM

## 2015-11-04 LAB — ECHOCARDIOGRAM COMPLETE
E decel time: 239 msec
E/e' ratio: 6.58
FS: 29 % (ref 28–44)
IVS/LV PW RATIO, ED: 1.05
LA ID, A-P, ES: 42 mm
LA diam end sys: 42 mm
LA diam index: 2.13 cm/m2
LA vol A4C: 61.8 ml
LA vol index: 32.7 mL/m2
LA vol: 64.5 mL
LV E/e' medial: 6.58
LV E/e'average: 6.58
LV PW d: 9.68 mm — AB (ref 0.6–1.1)
LV dias vol index: 41 mL/m2
LV dias vol: 81 mL (ref 62–150)
LV e' LATERAL: 10.4 cm/s
LV sys vol index: 18 mL/m2
LV sys vol: 36 mL (ref 21–61)
LVOT SV: 70 mL
LVOT VTI: 20.2 cm
LVOT area: 3.46 cm2
LVOT diameter: 21 mm
LVOT peak vel: 85 cm/s
MV Dec: 239
MV pk A vel: 66 m/s
MV pk E vel: 68.4 m/s
P 1/2 time: 1489 ms
PV Reg vel dias: 95 cm/s
Simpson's disk: 56
Stroke v: 45 ml
TAPSE: 19.8 mm
TDI e' lateral: 10.4
TDI e' medial: 6.53

## 2015-11-05 ENCOUNTER — Telehealth: Payer: Self-pay | Admitting: Cardiology

## 2015-11-05 NOTE — Telephone Encounter (Signed)
Pt calling to give some info for our records Pt was just in yesterday He normally goes to [facility]  We asked him to get the Stress test that he did in 2009 They couldn't release it until we sent    Sent request to them for that [phone]

## 2015-11-06 ENCOUNTER — Ambulatory Visit
Admission: RE | Admit: 2015-11-06 | Discharge: 2015-11-06 | Disposition: A | Discharge status: Home / Self Care | Payer: Medicare PPO | Source: Ambulatory Visit | Attending: Cardiology | Admitting: Cardiology

## 2015-11-06 DIAGNOSIS — R002 Palpitations: Secondary | ICD-10-CM | POA: Diagnosis present

## 2015-11-12 ENCOUNTER — Telehealth: Payer: Self-pay | Admitting: *Deleted

## 2015-11-12 NOTE — Telephone Encounter (Signed)
-----   Message from Almond LintAileen Ingal, MD sent at 11/12/2015  4:05 PM EDT ----- Overall rhythm was normal He had some extra beats from the top chamber and the bottom chamber, nothing was sustained. No evidence of A. Fib. If he is very symptomatic from these, we can potentially increase his atenolol to 50 once a day. He will need to monitor his blood pressure.

## 2015-11-12 NOTE — Telephone Encounter (Signed)
Reviewed results with patient in detail and Dr. Gearlean AlfIngal's recommendations. Patient states that his symptoms have not been that bad right now. So he wants to hold off on increasing that medication for right now. Instructed him to call back if his symptoms should worsen and he is coming in to see Dr. Alvino ChapelIngal for follow up on 11/25/15 at 10:00AM. He verbalized understanding of our conversation, agreement with plan of care, follow up appointment, and had no further questions at this time.

## 2015-11-25 ENCOUNTER — Encounter: Payer: Self-pay | Admitting: Family Medicine

## 2015-11-25 ENCOUNTER — Ambulatory Visit (INDEPENDENT_AMBULATORY_CARE_PROVIDER_SITE_OTHER): Payer: Medicare PPO | Admitting: Cardiology

## 2015-11-25 ENCOUNTER — Telehealth: Payer: Self-pay | Admitting: Cardiology

## 2015-11-25 ENCOUNTER — Ambulatory Visit (INDEPENDENT_AMBULATORY_CARE_PROVIDER_SITE_OTHER): Payer: Medicare PPO | Admitting: Family Medicine

## 2015-11-25 ENCOUNTER — Encounter: Payer: Self-pay | Admitting: Cardiology

## 2015-11-25 VITALS — BP 128/70 | HR 55 | Ht 69.0 in | Wt 175.2 lb

## 2015-11-25 DIAGNOSIS — I493 Ventricular premature depolarization: Secondary | ICD-10-CM | POA: Diagnosis not present

## 2015-11-25 DIAGNOSIS — E785 Hyperlipidemia, unspecified: Secondary | ICD-10-CM

## 2015-11-25 DIAGNOSIS — R1310 Dysphagia, unspecified: Secondary | ICD-10-CM

## 2015-11-25 DIAGNOSIS — R7309 Other abnormal glucose: Secondary | ICD-10-CM | POA: Insufficient documentation

## 2015-11-25 DIAGNOSIS — I491 Atrial premature depolarization: Secondary | ICD-10-CM

## 2015-11-25 DIAGNOSIS — R739 Hyperglycemia, unspecified: Secondary | ICD-10-CM | POA: Diagnosis not present

## 2015-11-25 DIAGNOSIS — N401 Enlarged prostate with lower urinary tract symptoms: Secondary | ICD-10-CM

## 2015-11-25 DIAGNOSIS — R931 Abnormal findings on diagnostic imaging of heart and coronary circulation: Secondary | ICD-10-CM

## 2015-11-25 DIAGNOSIS — Z5181 Encounter for therapeutic drug level monitoring: Secondary | ICD-10-CM

## 2015-11-25 DIAGNOSIS — R002 Palpitations: Secondary | ICD-10-CM

## 2015-11-25 DIAGNOSIS — I10 Essential (primary) hypertension: Secondary | ICD-10-CM

## 2015-11-25 DIAGNOSIS — N138 Other obstructive and reflux uropathy: Secondary | ICD-10-CM

## 2015-11-25 DIAGNOSIS — M1990 Unspecified osteoarthritis, unspecified site: Secondary | ICD-10-CM

## 2015-11-25 LAB — CBC WITH DIFFERENTIAL/PLATELET
Basophils Absolute: 0 cells/uL (ref 0–200)
Basophils Relative: 0 %
Eosinophils Absolute: 330 cells/uL (ref 15–500)
Eosinophils Relative: 6 %
HCT: 40.5 % (ref 38.5–50.0)
Hemoglobin: 14 g/dL (ref 13.2–17.1)
Lymphocytes Relative: 27 %
Lymphs Abs: 1485 cells/uL (ref 850–3900)
MCH: 31.7 pg (ref 27.0–33.0)
MCHC: 34.6 g/dL (ref 32.0–36.0)
MCV: 91.6 fL (ref 80.0–100.0)
MPV: 10.3 fL (ref 7.5–12.5)
Monocytes Absolute: 385 cells/uL (ref 200–950)
Monocytes Relative: 7 %
Neutro Abs: 3300 cells/uL (ref 1500–7800)
Neutrophils Relative %: 60 %
Platelets: 188 10*3/uL (ref 140–400)
RBC: 4.42 MIL/uL (ref 4.20–5.80)
RDW: 13.4 % (ref 11.0–15.0)
WBC: 5.5 10*3/uL (ref 3.8–10.8)

## 2015-11-25 LAB — COMPLETE METABOLIC PANEL WITH GFR
ALT: 21 U/L (ref 9–46)
AST: 22 U/L (ref 10–35)
Albumin: 3.6 g/dL (ref 3.6–5.1)
Alkaline Phosphatase: 49 U/L (ref 40–115)
BUN: 30 mg/dL — ABNORMAL HIGH (ref 7–25)
CO2: 29 mmol/L (ref 20–31)
Calcium: 9.2 mg/dL (ref 8.6–10.3)
Chloride: 100 mmol/L (ref 98–110)
Creat: 0.99 mg/dL (ref 0.70–1.18)
GFR, Est African American: 88 mL/min (ref >=60)
GFR, Est Non African American: 76 mL/min (ref >=60)
Glucose, Bld: 100 mg/dL — ABNORMAL HIGH (ref 65–99)
Potassium: 4.8 mmol/L (ref 3.5–5.3)
Sodium: 138 mmol/L (ref 135–146)
Total Bilirubin: 0.8 mg/dL (ref 0.2–1.2)
Total Protein: 6.5 g/dL (ref 6.1–8.1)

## 2015-11-25 LAB — LIPID PANEL
Cholesterol: 153 mg/dL (ref 125–200)
HDL: 42 mg/dL (ref >=40)
LDL Cholesterol: 90 mg/dL (ref <130)
Total CHOL/HDL Ratio: 3.6 Ratio (ref <=5.0)
Triglycerides: 105 mg/dL (ref <150)
VLDL: 21 mg/dL (ref <30)

## 2015-11-25 LAB — HEMOGLOBIN A1C
Hgb A1c MFr Bld: 5.9 % — ABNORMAL HIGH (ref <5.7)
Mean Plasma Glucose: 123 mg/dL

## 2015-11-25 NOTE — Assessment & Plan Note (Signed)
Check PSA and send to Dr. Achilles Dunk

## 2015-11-25 NOTE — Assessment & Plan Note (Signed)
Check sgpt and cbc and creatinine

## 2015-11-25 NOTE — Telephone Encounter (Signed)
Spoke with Thomas [name] and instructed him to come by and sign a release form so that we may request his records from the [facility]. Patient states that he is going to try and call the nurse at the [facility] to see if he can get those records as well but will come by to sign form so we can also try. He verbalized understanding and had no further questions at this time.

## 2015-11-25 NOTE — Assessment & Plan Note (Signed)
Continue meloxicam; take aspirin at least an hour before meloxicam
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
## 2015-11-25 NOTE — Patient Instructions (Addendum)
Talk with Dr. Alvino Chapel about dropping your atenolol to 12.5 mg daily instead of 25 mg daily (I already changed it in your med list and hope she will agree, but respectfully defer to her if she wants you to stay on 25 mg daily Let's get labs today Stay well-hydrated, stay hydrated through the day Consider gatorade or propel to help replace electrolytes if you have been sweating  Try to follow the DASH guidelines (DASH stands for Dietary Approaches to Stop Hypertension) Try to limit the sodium in your diet.  Ideally, consume less than 1.5 grams (less than 1,500mg ) per day. Do not add salt when cooking or at the table.  Check the sodium amount on labels when shopping, and choose items lower in sodium when given a choice. Avoid or limit foods that already contain a lot of sodium. Eat a diet rich in fruits and vegetables and whole grains.  Return in 6 months, sooner if needed   Cholesterol Cholesterol is a white, waxy, fat-like substance needed by your body in small amounts. The liver makes all the cholesterol you need. Cholesterol is carried from the liver by the blood through the blood vessels. Deposits of cholesterol (plaque) may build up on blood vessel walls. These make the arteries narrower and stiffer. Cholesterol plaques increase the risk for heart attack and stroke.  You cannot feel your cholesterol level even if it is very high. The only way to know it is high is with a blood test. Once you know your cholesterol levels, you should keep a record of the test results. Work with your health care provider to keep your levels in the desired range.  WHAT DO THE RESULTS MEAN?  Total cholesterol is a rough measure of all the cholesterol in your blood.   LDL is the so-called bad cholesterol. This is the type that deposits cholesterol in the walls of the arteries. You want this level to be low.   HDL is the good cholesterol because it cleans the arteries and carries the LDL away. You want this level to  be high.  Triglycerides are fat that the body can either burn for energy or store. High levels are closely linked to heart disease.  WHAT ARE THE DESIRED LEVELS OF CHOLESTEROL?  Total cholesterol below 200.   LDL below 100 for people at risk, below 70 for those at very high risk.   HDL above 50 is good, above 60 is best.   Triglycerides below 150.  HOW CAN I LOWER MY CHOLESTEROL?  Diet. Follow your diet programs as directed by your health care provider.   Choose fish or white meat chicken and [place], roasted or baked. Limit fatty cuts of red meat, fried foods, and processed meats, such as sausage and lunch meats.   Eat lots of fresh fruits and vegetables.  Choose whole grains, beans, pasta, potatoes, and cereals.   Use only small amounts of olive, corn, or canola oils.   Avoid butter, mayonnaise, shortening, or palm kernel oils.  Avoid foods with trans fats.   Drink skim or nonfat milk and eat low-fat or nonfat yogurt and cheeses. Avoid whole milk, cream, ice cream, egg yolks, and full-fat cheeses.   Healthy desserts include angel food cake, ginger snaps, animal crackers, hard candy, popsicles, and low-fat or nonfat frozen yogurt. Avoid pastries, cakes, pies, and cookies.   Exercise. Follow your exercise programs as directed by your health care provider.   A regular program helps decrease LDL and raise HDL.  A regular program helps with weight control.   Do things that increase your activity level like gardening, walking, or taking the stairs. Ask your health care provider about how you can be more active in your daily life.   Medicine. Take medicine only as directed by your health care provider.   Medicine may be prescribed by your health care provider to help lower cholesterol and decrease the risk for heart disease.   If you have several risk factors, you may need medicine even if your levels are normal.   This information is not intended to replace  advice given to you by your health care provider. Make sure you discuss any questions you have with your health care provider.   Document Released: 01/11/2001 Document Revised: 05/09/2014 Document Reviewed: 01/30/2013 Elsevier Interactive Patient Education 2016 Elsevier Inc. DASH Eating Plan DASH stands for "Dietary Approaches to Stop Hypertension." The DASH eating plan is a healthy eating plan that has been shown to reduce high blood pressure (hypertension). Additional health benefits may include reducing the risk of type 2 diabetes mellitus, heart disease, and stroke. The DASH eating plan may also help with weight loss. WHAT DO I NEED TO KNOW ABOUT THE DASH EATING PLAN? For the DASH eating plan, you will follow these general guidelines:  Choose foods with a percent daily value for sodium of less than 5% (as listed on the food label).  Use salt-free seasonings or herbs instead of table salt or sea salt.  Check with your health care provider or pharmacist before using salt substitutes.  Eat lower-sodium products, often labeled as "lower sodium" or "no salt added."  Eat fresh foods.  Eat more vegetables, fruits, and low-fat dairy products.  Choose whole grains. Look for the word "whole" as the first word in the ingredient list.  Choose fish and skinless chicken or [place] more often than red meat. Limit fish, poultry, and meat to 6 oz (170 g) each day.  Limit sweets, desserts, sugars, and sugary drinks.  Choose heart-healthy fats.  Limit cheese to 1 oz (28 g) per day.  Eat more home-cooked food and less restaurant, buffet, and fast food.  Limit fried foods.  Cook foods using methods other than frying.  Limit canned vegetables. If you do use them, rinse them well to decrease the sodium.  When eating at a restaurant, ask that your food be prepared with less salt, or no salt if possible. WHAT FOODS CAN I EAT? Seek help from a dietitian for individual calorie needs. Grains Whole  grain or whole wheat bread. Brown rice. Whole grain or whole wheat pasta. Quinoa, bulgur, and whole grain cereals. Low-sodium cereals. Corn or whole wheat flour tortillas. Whole grain cornbread. Whole grain crackers. Low-sodium crackers. Vegetables Fresh or frozen vegetables (raw, steamed, roasted, or grilled). Low-sodium or reduced-sodium tomato and vegetable juices. Low-sodium or reduced-sodium tomato sauce and paste. Low-sodium or reduced-sodium canned vegetables.  Fruits All fresh, canned (in natural juice), or frozen fruits. Meat and Other Protein Products Ground beef (85% or leaner), grass-fed beef, or beef trimmed of fat. Skinless chicken or [place]. Ground chicken or [place]. Pork trimmed of fat. All fish and seafood. Eggs. Dried beans, peas, or lentils. Unsalted nuts and seeds. Unsalted canned beans. Dairy Low-fat dairy products, such as skim or 1% milk, 2% or reduced-fat cheeses, low-fat ricotta or cottage cheese, or plain low-fat yogurt. Low-sodium or reduced-sodium cheeses. Fats and Oils Tub margarines without trans fats. Light or reduced-fat mayonnaise and salad dressings (reduced sodium). Avocado.  Safflower, olive, or canola oils. Natural peanut or almond butter. Other Unsalted popcorn and pretzels. The items listed above may not be a complete list of recommended foods or beverages. Contact your dietitian for more options. WHAT FOODS ARE NOT RECOMMENDED? Grains White bread. White pasta. White rice. Refined cornbread. Bagels and croissants. Crackers that contain trans fat. Vegetables Creamed or fried vegetables. Vegetables in a cheese sauce. Regular canned vegetables. Regular canned tomato sauce and paste. Regular tomato and vegetable juices. Fruits Dried fruits. Canned fruit in light or heavy syrup. Fruit juice. Meat and Other Protein Products Fatty cuts of meat. Ribs, chicken wings, bacon, sausage, bologna, salami, chitterlings, fatback, hot dogs, bratwurst, and packaged luncheon  meats. Salted nuts and seeds. Canned beans with salt. Dairy Whole or 2% milk, cream, half-and-half, and cream cheese. Whole-fat or sweetened yogurt. Full-fat cheeses or blue cheese. Nondairy creamers and whipped toppings. Processed cheese, cheese spreads, or cheese curds. Condiments Onion and garlic salt, seasoned salt, table salt, and sea salt. Canned and packaged gravies. Worcestershire sauce. Tartar sauce. Barbecue sauce. Teriyaki sauce. Soy sauce, including reduced sodium. Steak sauce. Fish sauce. Oyster sauce. Cocktail sauce. Horseradish. Ketchup and mustard. Meat flavorings and tenderizers. Bouillon cubes. Hot sauce. Tabasco sauce. Marinades. Taco seasonings. Relishes. Fats and Oils Butter, stick margarine, lard, shortening, ghee, and bacon fat. Coconut, palm kernel, or palm oils. Regular salad dressings. Other Pickles and olives. Salted popcorn and pretzels. The items listed above may not be a complete list of foods and beverages to avoid. Contact your dietitian for more information. WHERE CAN I FIND MORE INFORMATION? National Heart, Lung, and Blood Institute: [employer]   This information is not intended to replace advice given to you by your health care provider. Make sure you discuss any questions you have with your health care provider.   Document Released: 04/07/2011 Document Revised: 05/09/2014 Document Reviewed: 02/20/2013 Elsevier Interactive Patient Education [employer]

## 2015-11-25 NOTE — Patient Instructions (Signed)
Follow-Up: Your physician wants you to follow-up in: 6 months with Dr. Ingal. You will receive a reminder letter in the mail two months in advance. If you don't receive a letter, please call our office to schedule the follow-up appointment.  It was a pleasure seeing you today here in the office. Please do not hesitate to give us a call back if you have any further questions. [phone]  Trevionne Advani A. RN, BSN    

## 2015-11-25 NOTE — Assessment & Plan Note (Signed)
Refer to GI, may have esophageal narrowing or stricture, may need EGD and dilatation

## 2015-11-25 NOTE — Assessment & Plan Note (Signed)
Well controlled 

## 2015-11-25 NOTE — Telephone Encounter (Signed)
l mom to have pt come and sign release form.

## 2015-11-25 NOTE — Assessment & Plan Note (Signed)
Elevated glucose on labs in ER in May; check glucose and A1c today; strong fam hx of diabetes

## 2015-11-25 NOTE — Telephone Encounter (Signed)
L MOM for pt to sign release form.

## 2015-11-25 NOTE — Assessment & Plan Note (Signed)
Check cholesterol today; continue statin, limit saturated fats
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
## 2015-11-26 LAB — PSA, TOTAL AND FREE
PSA, Free Pct: 44 % (ref >25)
PSA, Free: 0.2 ng/mL
PSA: 0.45 ng/mL (ref <=4.00)

## 2015-11-27 NOTE — Telephone Encounter (Signed)
Patient signed ROI for stress test records from [place].  Faxed to [facility] in [place] [phone].

## 2015-12-14 ENCOUNTER — Encounter: Payer: Self-pay | Admitting: Internal Medicine

## 2016-02-19 ENCOUNTER — Ambulatory Visit: Payer: Medicare PPO | Admitting: Internal Medicine

## 2016-05-31 ENCOUNTER — Ambulatory Visit (INDEPENDENT_AMBULATORY_CARE_PROVIDER_SITE_OTHER): Payer: Medicare HMO | Admitting: Family Medicine

## 2016-05-31 ENCOUNTER — Encounter: Payer: Self-pay | Admitting: Family Medicine

## 2016-05-31 VITALS — BP 118/68 | HR 68 | Temp 98.1°F | Resp 14 | Wt 171.0 lb

## 2016-05-31 DIAGNOSIS — M1990 Unspecified osteoarthritis, unspecified site: Secondary | ICD-10-CM | POA: Diagnosis not present

## 2016-05-31 DIAGNOSIS — N400 Enlarged prostate without lower urinary tract symptoms: Secondary | ICD-10-CM

## 2016-05-31 DIAGNOSIS — Z23 Encounter for immunization: Secondary | ICD-10-CM

## 2016-05-31 DIAGNOSIS — R739 Hyperglycemia, unspecified: Secondary | ICD-10-CM

## 2016-05-31 DIAGNOSIS — I10 Essential (primary) hypertension: Secondary | ICD-10-CM | POA: Diagnosis not present

## 2016-05-31 DIAGNOSIS — E782 Mixed hyperlipidemia: Secondary | ICD-10-CM

## 2016-05-31 DIAGNOSIS — Z5181 Encounter for therapeutic drug level monitoring: Secondary | ICD-10-CM

## 2016-05-31 LAB — COMPLETE METABOLIC PANEL WITH GFR
ALT: 19 U/L (ref 9–46)
AST: 21 U/L (ref 10–35)
Albumin: 3.8 g/dL (ref 3.6–5.1)
Alkaline Phosphatase: 49 U/L (ref 40–115)
BUN: 23 mg/dL (ref 7–25)
CO2: 28 mmol/L (ref 20–31)
Calcium: 9.5 mg/dL (ref 8.6–10.3)
Chloride: 98 mmol/L (ref 98–110)
Creat: 1.03 mg/dL (ref 0.70–1.18)
GFR, Est African American: 84 mL/min (ref >=60)
GFR, Est Non African American: 72 mL/min (ref >=60)
Glucose, Bld: 105 mg/dL — ABNORMAL HIGH (ref 65–99)
Potassium: 4.3 mmol/L (ref 3.5–5.3)
Sodium: 137 mmol/L (ref 135–146)
Total Bilirubin: 0.9 mg/dL (ref 0.2–1.2)
Total Protein: 7.4 g/dL (ref 6.1–8.1)

## 2016-05-31 LAB — LIPID PANEL
Cholesterol: 181 mg/dL (ref <200)
HDL: 40 mg/dL — ABNORMAL LOW (ref >40)
LDL Cholesterol: 118 mg/dL — ABNORMAL HIGH (ref <100)
Total CHOL/HDL Ratio: 4.5 Ratio (ref <5.0)
Triglycerides: 114 mg/dL (ref <150)
VLDL: 23 mg/dL (ref <30)

## 2016-05-31 LAB — CBC WITH DIFFERENTIAL/PLATELET
Basophils Absolute: 0 cells/uL (ref 0–200)
Basophils Relative: 0 %
Eosinophils Absolute: 290 cells/uL (ref 15–500)
Eosinophils Relative: 5 %
HCT: 44.9 % (ref 38.5–50.0)
Hemoglobin: 15.2 g/dL (ref 13.2–17.1)
Lymphocytes Relative: 25 %
Lymphs Abs: 1450 cells/uL (ref 850–3900)
MCH: 31.5 pg (ref 27.0–33.0)
MCHC: 33.9 g/dL (ref 32.0–36.0)
MCV: 93.2 fL (ref 80.0–100.0)
MPV: 9.8 fL (ref 7.5–12.5)
Monocytes Absolute: 348 cells/uL (ref 200–950)
Monocytes Relative: 6 %
Neutro Abs: 3712 cells/uL (ref 1500–7800)
Neutrophils Relative %: 64 %
Platelets: 200 10*3/uL (ref 140–400)
RBC: 4.82 MIL/uL (ref 4.20–5.80)
RDW: 13.9 % (ref 11.0–15.0)
WBC: 5.8 10*3/uL (ref 3.8–10.8)

## 2016-05-31 LAB — PSA: PSA: 0.4 ng/mL (ref <=4.0)

## 2016-05-31 MED ORDER — HYDROCHLOROTHIAZIDE 25 MG PO TABS: 12.5000 mg | ORAL_TABLET | Freq: Every day | ORAL | 3 refills | Status: DC

## 2016-05-31 MED ORDER — MELOXICAM 15 MG PO TABS: 15.0000 mg | ORAL_TABLET | Freq: Every day | ORAL | 1 refills | Status: DC | PRN

## 2016-05-31 NOTE — Assessment & Plan Note (Signed)
Continue meloxicam; regular activity

## 2016-05-31 NOTE — Assessment & Plan Note (Signed)
Check SGPT, creatinine 

## 2016-05-31 NOTE — Assessment & Plan Note (Signed)
Check PSA and patient to f/u with urologist next month
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
## 2016-05-31 NOTE — Patient Instructions (Signed)
Your goal blood pressure is less than 150 mmHg on top. Try to follow the DASH guidelines (DASH stands for Dietary Approaches to Stop Hypertension) Try to limit the sodium in your diet.  Ideally, consume less than 1.5 grams (less than 1,500mg ) per day. Do not add salt when cooking or at the table.  Check the sodium amount on labels when shopping, and choose items lower in sodium when given a choice. Avoid or limit foods that already contain a lot of sodium. Eat a diet rich in fruits and vegetables and whole grains. Reduce the HCTZ to just half of a pill daily Try to limit saturated fats in your diet (bologna, hot dogs, barbeque, cheeseburgers, hamburgers, steak, bacon, sausage, cheese, etc.) and get more fresh fruits, vegetables, and whole grains Let's get labs today Return in 6 months

## 2016-05-31 NOTE — Assessment & Plan Note (Signed)
Check lipids; continue statin 

## 2016-05-31 NOTE — Assessment & Plan Note (Signed)
Lower than needed numbers; reduce HCTZ

## 2016-05-31 NOTE — Assessment & Plan Note (Signed)
Prediabetes; check A1c today; avoid whites

## 2016-06-01 LAB — HEMOGLOBIN A1C
Hgb A1c MFr Bld: 5.7 % — ABNORMAL HIGH (ref <5.7)
Mean Plasma Glucose: 117 mg/dL

## 2016-06-08 ENCOUNTER — Telehealth: Payer: Self-pay | Admitting: Family Medicine

## 2016-06-08 ENCOUNTER — Other Ambulatory Visit: Payer: Self-pay | Admitting: Family Medicine

## 2016-06-08 DIAGNOSIS — E782 Mixed hyperlipidemia: Secondary | ICD-10-CM

## 2016-06-08 DIAGNOSIS — Z5181 Encounter for therapeutic drug level monitoring: Secondary | ICD-10-CM

## 2016-06-08 MED ORDER — ATORVASTATIN CALCIUM 20 MG PO TABS: 20.0000 mg | ORAL_TABLET | Freq: Every day | ORAL | 1 refills | Status: DC

## 2016-06-08 MED ORDER — OSELTAMIVIR PHOSPHATE 75 MG PO CAPS: 75.0000 mg | ORAL_CAPSULE | Freq: Every day | ORAL | 0 refills | Status: AC

## 2016-06-08 NOTE — Telephone Encounter (Signed)
Absolutely; Tamiflu sent to [facility] [place]

## 2016-06-08 NOTE — Assessment & Plan Note (Signed)
Increase statin; recheck labs in 6-8 weeks

## 2016-06-08 NOTE — Telephone Encounter (Signed)
Patient girlfriend has been dx with flu from [place] can you give him rx for tamiflu while here?

## 2016-06-08 NOTE — Progress Notes (Signed)
New higher dose; recheck labs in 6-8 weeks

## 2016-06-08 NOTE — Assessment & Plan Note (Signed)
Check sgpt in 6-8 weeks 

## 2016-06-30 ENCOUNTER — Telehealth: Payer: Self-pay | Admitting: Cardiology

## 2016-06-30 NOTE — Telephone Encounter (Signed)
Spoke with patient regarding 6 mth follow up on Echocardiogram and he doesn't want to schedule at this point. He will call back later.

## 2016-07-20 ENCOUNTER — Encounter: Payer: Self-pay | Admitting: Cardiology

## 2016-07-20 ENCOUNTER — Ambulatory Visit (INDEPENDENT_AMBULATORY_CARE_PROVIDER_SITE_OTHER): Payer: Medicare HMO | Admitting: Cardiology

## 2016-07-20 VITALS — BP 138/76 | HR 75 | Ht 69.5 in | Wt 175.0 lb

## 2016-07-20 DIAGNOSIS — E782 Mixed hyperlipidemia: Secondary | ICD-10-CM | POA: Diagnosis not present

## 2016-07-20 DIAGNOSIS — I10 Essential (primary) hypertension: Secondary | ICD-10-CM

## 2016-07-20 DIAGNOSIS — I493 Ventricular premature depolarization: Secondary | ICD-10-CM
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
## 2016-07-20 NOTE — Patient Instructions (Addendum)
Testing/Procedures: [facility] [place]  Follow-Up: Your physician wants you to follow-up in: 6 months. You will receive a reminder letter in the mail two months in advance. If you don't receive a letter, please call our office to schedule the follow-up appointment.  It was a pleasure seeing you today here in the office. Please do not hesitate to give us a call back if you have any further questions. [phone]  Kingston CellarPamela A. RN, BSN

## 2016-07-21 ENCOUNTER — Telehealth: Payer: Self-pay

## 2016-07-21 NOTE — Telephone Encounter (Signed)
Letter from his insurance company is concern about pt not taking his atorvastatin 20 mg tablet at night. Pt verified he is taking it.

## 2016-11-30 ENCOUNTER — Encounter: Payer: Self-pay | Admitting: Family Medicine

## 2016-11-30 ENCOUNTER — Ambulatory Visit (INDEPENDENT_AMBULATORY_CARE_PROVIDER_SITE_OTHER): Payer: Medicare HMO | Admitting: Family Medicine

## 2016-11-30 DIAGNOSIS — I10 Essential (primary) hypertension: Secondary | ICD-10-CM

## 2016-11-30 DIAGNOSIS — E782 Mixed hyperlipidemia: Secondary | ICD-10-CM | POA: Diagnosis not present

## 2016-11-30 DIAGNOSIS — Z1211 Encounter for screening for malignant neoplasm of colon: Secondary | ICD-10-CM | POA: Diagnosis not present

## 2016-11-30 DIAGNOSIS — Z5181 Encounter for therapeutic drug level monitoring: Secondary | ICD-10-CM

## 2016-11-30 DIAGNOSIS — R739 Hyperglycemia, unspecified: Secondary | ICD-10-CM | POA: Diagnosis not present

## 2016-11-30 DIAGNOSIS — M1990 Unspecified osteoarthritis, unspecified site: Secondary | ICD-10-CM | POA: Diagnosis not present

## 2016-11-30 LAB — CBC WITH DIFFERENTIAL/PLATELET
Basophils Absolute: 0 cells/uL (ref 0–200)
Basophils Relative: 0 %
Eosinophils Absolute: 255 cells/uL (ref 15–500)
Eosinophils Relative: 5 %
HCT: 42.9 % (ref 38.5–50.0)
Hemoglobin: 14.3 g/dL (ref 13.2–17.1)
Lymphocytes Relative: 26 %
Lymphs Abs: 1326 cells/uL (ref 850–3900)
MCH: 32 pg (ref 27.0–33.0)
MCHC: 33.3 g/dL (ref 32.0–36.0)
MCV: 96 fL (ref 80.0–100.0)
MPV: 10.3 fL (ref 7.5–12.5)
Monocytes Absolute: 357 cells/uL (ref 200–950)
Monocytes Relative: 7 %
Neutro Abs: 3162 cells/uL (ref 1500–7800)
Neutrophils Relative %: 62 %
Platelets: 180 10*3/uL (ref 140–400)
RBC: 4.47 MIL/uL (ref 4.20–5.80)
RDW: 14.1 % (ref 11.0–15.0)
WBC: 5.1 10*3/uL (ref 3.8–10.8)
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
## 2016-11-30 NOTE — Assessment & Plan Note (Signed)
Stop HCTZ; patient will monitor BP at home and call me in two weeks; continue DASH guidelines

## 2016-11-30 NOTE — Patient Instructions (Addendum)
STOP the HCTZ (hydrochlorothiazide) Monitor blood pressure; call me with an update in two weeks to see if we keep you off of it or adjust to something else We'll contact you tomorrow about the labs and I'll send in refills of your cholesterol medicine or adjust the dose if needed Try turmeric as a natural anti-inflammatory (for pain and arthritis). It comes in capsules where you buy aspirin and fish oil, but also as a spice where you buy pepper and garlic powder. I've ordered Cologuard for you; contact us if you have not heard from them in two weeks   DASH Eating Plan DASH stands for "Dietary Approaches to Stop Hypertension." The DASH eating plan is a healthy eating plan that has been shown to reduce high blood pressure (hypertension). It may also reduce your risk for type 2 diabetes, heart disease, and stroke. The DASH eating plan may also help with weight loss. What are tips for following this plan? General guidelines  Avoid eating more than 2,300 mg (milligrams) of salt (sodium) a day. If you have hypertension, you may need to reduce your sodium intake to 1,500 mg a day.  Limit alcohol intake to no more than 1 drink a day for nonpregnant women and 2 drinks a day for men. One drink equals 12 oz of beer, 5 oz of wine, or 1 oz of hard liquor.  Work with your health care provider to maintain a healthy body weight or to lose weight. Ask what an ideal weight is for you.  Get at least 30 minutes of exercise that causes your heart to beat faster (aerobic exercise) most days of the week. Activities may include walking, swimming, or biking.  Work with your health care provider or diet and nutrition specialist (dietitian) to adjust your eating plan to your individual calorie needs. Reading food labels  Check food labels for the amount of sodium per serving. Choose foods with less than 5 percent of the Daily Value of sodium. Generally, foods with less than 300 mg of sodium per serving fit into this  eating plan.  To find whole grains, look for the word "whole" as the first word in the ingredient list. Shopping  Buy products labeled as "low-sodium" or "no salt added."  Buy fresh foods. Avoid canned foods and premade or frozen meals. Cooking  Avoid adding salt when cooking. Use salt-free seasonings or herbs instead of table salt or sea salt. Check with your health care provider or pharmacist before using salt substitutes.  Do not fry foods. Cook foods using healthy methods such as baking, boiling, grilling, and broiling instead.  Cook with heart-healthy oils, such as olive, canola, soybean, or sunflower oil. Meal planning   Eat a balanced diet that includes: ? 5 or more servings of fruits and vegetables each day. At each meal, try to fill half of your plate with fruits and vegetables. ? Up to 6-8 servings of whole grains each day. ? Less than 6 oz of lean meat, poultry, or fish each day. A 3-oz serving of meat is about the same size as a deck of cards. One egg equals 1 oz. ? 2 servings of low-fat dairy each day. ? A serving of nuts, seeds, or beans 5 times each week. ? Heart-healthy fats. Healthy fats called Omega-3 fatty acids are found in foods such as flaxseeds and coldwater fish, like sardines, salmon, and mackerel.  Limit how much you eat of the following: ? Canned or prepackaged foods. ? Food that is high  in trans fat, such as fried foods. ? Food that is high in saturated fat, such as fatty meat. ? Sweets, desserts, sugary drinks, and other foods with added sugar. ? Full-fat dairy products.  Do not salt foods before eating.  Try to eat at least 2 vegetarian meals each week.  Eat more home-cooked food and less restaurant, buffet, and fast food.  When eating at a restaurant, ask that your food be prepared with less salt or no salt, if possible. What foods are recommended? The items listed may not be a complete list. Talk with your dietitian about what dietary choices  are best for you. Grains Whole-grain or whole-wheat bread. Whole-grain or whole-wheat pasta. Brown rice. [place]. Bulgur. Whole-grain and low-sodium cereals. Pita bread. Low-fat, low-sodium crackers. Whole-wheat flour tortillas. Vegetables Fresh or frozen vegetables (raw, steamed, roasted, or grilled). Low-sodium or reduced-sodium tomato and vegetable juice. Low-sodium or reduced-sodium tomato sauce and tomato paste. Low-sodium or reduced-sodium canned vegetables. Fruits All fresh, dried, or frozen fruit. Canned fruit in natural juice (without added sugar). Meat and other protein foods Skinless chicken or [place]. Ground chicken or [place]. Pork with fat trimmed off. Fish and seafood. Egg whites. Dried beans, peas, or lentils. Unsalted nuts, nut butters, and seeds. Unsalted canned beans. Lean cuts of beef with fat trimmed off. Low-sodium, lean deli meat. Dairy Low-fat (1%) or fat-free (skim) milk. Fat-free, low-fat, or reduced-fat cheeses. Nonfat, low-sodium ricotta or cottage cheese. Low-fat or nonfat yogurt. Low-fat, low-sodium cheese. Fats and oils Soft margarine without trans fats. Vegetable oil. Low-fat, reduced-fat, or light mayonnaise and salad dressings (reduced-sodium). Canola, safflower, olive, soybean, and sunflower oils. Avocado. Seasoning and other foods Herbs. Spices. Seasoning mixes without salt. Unsalted popcorn and pretzels. Fat-free sweets. What foods are not recommended? The items listed may not be a complete list. Talk with your dietitian about what dietary choices are best for you. Grains Baked goods made with fat, such as croissants, muffins, or some breads. Dry pasta or rice meal packs. Vegetables Creamed or fried vegetables. Vegetables in a cheese sauce. Regular canned vegetables (not low-sodium or reduced-sodium). Regular canned tomato sauce and paste (not low-sodium or reduced-sodium). Regular tomato and vegetable juice (not low-sodium or reduced-sodium). Angie Fava.  Olives. Fruits Canned fruit in a light or heavy syrup. Fried fruit. Fruit in cream or butter sauce. Meat and other protein foods Fatty cuts of meat. Ribs. Fried meat. Berniece Salines. Sausage. Bologna and other processed lunch meats. Salami. Fatback. Hotdogs. Bratwurst. Salted nuts and seeds. Canned beans with added salt. Canned or smoked fish. Whole eggs or egg yolks. Chicken or [place] with skin. Dairy Whole or 2% milk, cream, and half-and-half. Whole or full-fat cream cheese. Whole-fat or sweetened yogurt. Full-fat cheese. Nondairy creamers. Whipped toppings. Processed cheese and cheese spreads. Fats and oils Butter. Stick margarine. Lard. Shortening. Ghee. Bacon fat. Tropical oils, such as coconut, palm kernel, or palm oil. Seasoning and other foods Salted popcorn and pretzels. Onion salt, garlic salt, seasoned salt, table salt, and sea salt. Worcestershire sauce. Tartar sauce. Barbecue sauce. Teriyaki sauce. Soy sauce, including reduced-sodium. Steak sauce. Canned and packaged gravies. Fish sauce. Oyster sauce. Cocktail sauce. Horseradish that you find on the shelf. Ketchup. Mustard. Meat flavorings and tenderizers. Bouillon cubes. Hot sauce and Tabasco sauce. Premade or packaged marinades. Premade or packaged taco seasonings. Relishes. Regular salad dressings. Where to find more information:  National Heart, Lung, and [place]: https://wilson-eaton.com/  American Heart Association: www.heart.org Summary  The DASH eating plan is a healthy eating plan  that has been shown to reduce high blood pressure (hypertension). It may also reduce your risk for type 2 diabetes, heart disease, and stroke.  With the DASH eating plan, you should limit salt (sodium) intake to 2,300 mg a day. If you have hypertension, you may need to reduce your sodium intake to 1,500 mg a day.  When on the DASH eating plan, aim to eat more fresh fruits and vegetables, whole grains, lean proteins, low-fat dairy, and heart-healthy  fats.  Work with your health care provider or diet and nutrition specialist (dietitian) to adjust your eating plan to your individual calorie needs. This information is not intended to replace advice given to you by your health care provider. Make sure you discuss any questions you have with your health care provider. Document Released: 04/07/2011 Document Revised: 04/11/2016 Document Reviewed: 04/11/2016 Elsevier Interactive Patient Education  2017 [employer].

## 2016-11-30 NOTE — Assessment & Plan Note (Signed)
Check glucose fasting today and A1c; limit sugars

## 2016-11-30 NOTE — Assessment & Plan Note (Signed)
Check fasting labs; encourage limiting egg yolks to no more than 3 per week

## 2016-11-30 NOTE — Assessment & Plan Note (Signed)
Doing alright with meloxicam; monitor Cr; consider turmeric

## 2016-11-30 NOTE — Assessment & Plan Note (Signed)
Patient requests Cologuard; will order

## 2016-11-30 NOTE — Assessment & Plan Note (Signed)
Check labs 

## 2016-12-01 LAB — HEMOGLOBIN A1C
Hgb A1c MFr Bld: 5.7 % — ABNORMAL HIGH (ref <5.7)
Mean Plasma Glucose: 117 mg/dL

## 2016-12-01 LAB — LIPID PANEL
Cholesterol: 125 mg/dL (ref <200)
HDL: 42 mg/dL (ref >40)
LDL Cholesterol: 64 mg/dL (ref <100)
Total CHOL/HDL Ratio: 3 Ratio (ref <5.0)
Triglycerides: 96 mg/dL (ref <150)
VLDL: 19 mg/dL (ref <30)

## 2016-12-01 LAB — COMPLETE METABOLIC PANEL WITH GFR
ALT: 20 U/L (ref 9–46)
AST: 21 U/L (ref 10–35)
Albumin: 3.8 g/dL (ref 3.6–5.1)
Alkaline Phosphatase: 38 U/L — ABNORMAL LOW (ref 40–115)
BUN: 21 mg/dL (ref 7–25)
CO2: 23 mmol/L (ref 20–31)
Calcium: 9.1 mg/dL (ref 8.6–10.3)
Chloride: 98 mmol/L (ref 98–110)
Creat: 1 mg/dL (ref 0.70–1.18)
GFR, Est African American: 87 mL/min (ref >=60)
GFR, Est Non African American: 75 mL/min (ref >=60)
Glucose, Bld: 103 mg/dL — ABNORMAL HIGH (ref 65–99)
Potassium: 4.2 mmol/L (ref 3.5–5.3)
Sodium: 137 mmol/L (ref 135–146)
Total Bilirubin: 1.2 mg/dL (ref 0.2–1.2)
Total Protein: 6.6 g/dL (ref 6.1–8.1)

## 2016-12-31 LAB — COLOGUARD: Cologuard: NEGATIVE

## 2017-02-08 ENCOUNTER — Inpatient Hospital Stay
Admission: EM | Admit: 2017-02-08 | Discharge: 2017-02-14 | DRG: 418 | Disposition: A | Discharge status: Home / Self Care | Payer: Medicare HMO | Attending: Surgery | Admitting: Surgery

## 2017-02-08 ENCOUNTER — Emergency Department: Payer: Medicare HMO

## 2017-02-08 ENCOUNTER — Encounter: Payer: Self-pay | Admitting: Emergency Medicine

## 2017-02-08 DIAGNOSIS — I4891 Unspecified atrial fibrillation: Secondary | ICD-10-CM | POA: Diagnosis present

## 2017-02-08 DIAGNOSIS — I10 Essential (primary) hypertension: Secondary | ICD-10-CM | POA: Diagnosis present

## 2017-02-08 DIAGNOSIS — I471 Supraventricular tachycardia: Secondary | ICD-10-CM | POA: Diagnosis present

## 2017-02-08 DIAGNOSIS — R112 Nausea with vomiting, unspecified: Secondary | ICD-10-CM

## 2017-02-08 DIAGNOSIS — N401 Enlarged prostate with lower urinary tract symptoms: Secondary | ICD-10-CM | POA: Diagnosis present

## 2017-02-08 DIAGNOSIS — I493 Ventricular premature depolarization: Secondary | ICD-10-CM | POA: Diagnosis present

## 2017-02-08 DIAGNOSIS — K819 Cholecystitis, unspecified: Secondary | ICD-10-CM

## 2017-02-08 DIAGNOSIS — I480 Paroxysmal atrial fibrillation: Secondary | ICD-10-CM

## 2017-02-08 DIAGNOSIS — Z419 Encounter for procedure for purposes other than remedying health state, unspecified: Secondary | ICD-10-CM

## 2017-02-08 DIAGNOSIS — Z8249 Family history of ischemic heart disease and other diseases of the circulatory system: Secondary | ICD-10-CM

## 2017-02-08 DIAGNOSIS — Z87891 Personal history of nicotine dependence: Secondary | ICD-10-CM

## 2017-02-08 DIAGNOSIS — R1084 Generalized abdominal pain: Secondary | ICD-10-CM | POA: Diagnosis not present

## 2017-02-08 DIAGNOSIS — Z7982 Long term (current) use of aspirin: Secondary | ICD-10-CM

## 2017-02-08 DIAGNOSIS — J302 Other seasonal allergic rhinitis: Secondary | ICD-10-CM | POA: Diagnosis present

## 2017-02-08 DIAGNOSIS — E785 Hyperlipidemia, unspecified: Secondary | ICD-10-CM | POA: Diagnosis present

## 2017-02-08 DIAGNOSIS — Z7951 Long term (current) use of inhaled steroids: Secondary | ICD-10-CM

## 2017-02-08 DIAGNOSIS — Z79899 Other long term (current) drug therapy: Secondary | ICD-10-CM

## 2017-02-08 DIAGNOSIS — E876 Hypokalemia: Secondary | ICD-10-CM | POA: Diagnosis present

## 2017-02-08 DIAGNOSIS — Z823 Family history of stroke: Secondary | ICD-10-CM

## 2017-02-08 DIAGNOSIS — K810 Acute cholecystitis: Principal | ICD-10-CM | POA: Diagnosis present

## 2017-02-08 DIAGNOSIS — Z833 Family history of diabetes mellitus: Secondary | ICD-10-CM

## 2017-02-08 DIAGNOSIS — R109 Unspecified abdominal pain: Secondary | ICD-10-CM

## 2017-02-08 DIAGNOSIS — Z23 Encounter for immunization: Secondary | ICD-10-CM

## 2017-02-08 DIAGNOSIS — E878 Other disorders of electrolyte and fluid balance, not elsewhere classified: Secondary | ICD-10-CM | POA: Diagnosis present

## 2017-02-08 LAB — COMPREHENSIVE METABOLIC PANEL
ALT: 23 U/L (ref 17–63)
AST: 32 U/L (ref 15–41)
Albumin: 4.1 g/dL (ref 3.5–5.0)
Alkaline Phosphatase: 46 U/L (ref 38–126)
Anion gap: 14 (ref 5–15)
BUN: 22 mg/dL — ABNORMAL HIGH (ref 6–20)
CO2: 27 mmol/L (ref 22–32)
Calcium: 9.6 mg/dL (ref 8.9–10.3)
Chloride: 94 mmol/L — ABNORMAL LOW (ref 101–111)
Creatinine, Ser: 1.08 mg/dL (ref 0.61–1.24)
GFR calc Af Amer: >60 mL/min (ref >60)
GFR calc non Af Amer: >60 mL/min (ref >60)
Glucose, Bld: 144 mg/dL — ABNORMAL HIGH (ref 65–99)
Potassium: 3.3 mmol/L — ABNORMAL LOW (ref 3.5–5.1)
Sodium: 135 mmol/L (ref 135–145)
Total Bilirubin: 1.4 mg/dL — ABNORMAL HIGH (ref 0.3–1.2)
Total Protein: 8.1 g/dL (ref 6.5–8.1)

## 2017-02-08 LAB — CBC
HCT: 44.5 % (ref 40.0–52.0)
Hemoglobin: 15.4 g/dL (ref 13.0–18.0)
MCH: 32.2 pg (ref 26.0–34.0)
MCHC: 34.6 g/dL (ref 32.0–36.0)
MCV: 93.2 fL (ref 80.0–100.0)
Platelets: 206 10*3/uL (ref 150–440)
RBC: 4.77 MIL/uL (ref 4.40–5.90)
RDW: 13.3 % (ref 11.5–14.5)
WBC: 13.8 10*3/uL — ABNORMAL HIGH (ref 3.8–10.6)

## 2017-02-08 LAB — URINALYSIS, COMPLETE (UACMP) WITH MICROSCOPIC
Bacteria, UA: NONE SEEN
Bilirubin Urine: NEGATIVE
Glucose, UA: NEGATIVE mg/dL
Hgb urine dipstick: NEGATIVE
Ketones, ur: 20 mg/dL — AB
Leukocytes, UA: NEGATIVE
Nitrite: NEGATIVE
Protein, ur: NEGATIVE mg/dL
Specific Gravity, Urine: 1.02 (ref 1.005–1.030)
Squamous Epithelial / HPF: NONE SEEN
pH: 8 (ref 5.0–8.0)

## 2017-02-08 LAB — TROPONIN I: Troponin I: <0.03 ng/mL (ref <0.03)

## 2017-02-08 LAB — LIPASE, BLOOD: Lipase: 26 U/L (ref 11–51)

## 2017-02-08 MED ORDER — ONDANSETRON HCL 4 MG/2ML IJ SOLN
4.0000 mg | Freq: Once | INTRAMUSCULAR | Status: AC | PRN
  Administered 2017-02-08: 4 mg via INTRAVENOUS
  Filled 2017-02-08: qty 2

## 2017-02-08 MED ORDER — ONDANSETRON HCL 4 MG/2ML IJ SOLN
4.0000 mg | Freq: Once | INTRAMUSCULAR | Status: AC
  Administered 2017-02-08: 4 mg via INTRAVENOUS
  Filled 2017-02-08: qty 2

## 2017-02-08 MED ORDER — MORPHINE SULFATE (PF) 4 MG/ML IV SOLN
4.0000 mg | Freq: Once | INTRAVENOUS | Status: AC
  Administered 2017-02-08: 4 mg via INTRAVENOUS
  Filled 2017-02-08: qty 1

## 2017-02-08 MED ORDER — IOPAMIDOL (ISOVUE-300) INJECTION 61%
30.0000 mL | Freq: Once | INTRAVENOUS | Status: AC
  Administered 2017-02-08: 30 mL via ORAL

## 2017-02-08 MED ORDER — IOPAMIDOL (ISOVUE-300) INJECTION 61%
100.0000 mL | Freq: Once | INTRAVENOUS | Status: AC | PRN
  Administered 2017-02-08: 100 mL via INTRAVENOUS

## 2017-02-08 MED ORDER — SODIUM CHLORIDE 0.9 % IV BOLUS (SEPSIS)
1000.0000 mL | Freq: Once | INTRAVENOUS | Status: AC
  Administered 2017-02-08: 1000 mL via INTRAVENOUS

## 2017-02-08 NOTE — ED Notes (Signed)
Pt returned from CT; pt asked to use bathroom; pt given a urinal so a sample could be collected.
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
## 2017-02-08 NOTE — ED Triage Notes (Signed)
Patient with complaint of generalized abdominal pain, nausea and vomiting that started about 17:00 today. Patient states that he has vomited about 9 times.

## 2017-02-08 NOTE — ED Notes (Signed)
Pt states he is unable to ingest the contrast media drink without feeling nauseous; MD notified; MD order for the patient to be CT scanned without the contrast; CT called and informed of MD order; CT acknowledged.

## 2017-02-08 NOTE — ED Notes (Signed)
Pt went to CT

## 2017-02-09 ENCOUNTER — Observation Stay: Payer: Medicare HMO

## 2017-02-09 ENCOUNTER — Encounter
Admission: EM | Disposition: A | Discharge status: Home / Self Care | Payer: Self-pay | Source: Home / Self Care | Attending: General Surgery

## 2017-02-09 ENCOUNTER — Encounter: Payer: Self-pay | Admitting: Anesthesiology

## 2017-02-09 ENCOUNTER — Observation Stay: Payer: Medicare HMO | Admitting: Certified Registered"

## 2017-02-09 ENCOUNTER — Emergency Department: Payer: Medicare HMO

## 2017-02-09 DIAGNOSIS — K819 Cholecystitis, unspecified: Secondary | ICD-10-CM

## 2017-02-09 HISTORY — PX: CHOLECYSTECTOMY: SHX55

## 2017-02-09 LAB — SURGICAL PCR SCREEN
MRSA, PCR: NEGATIVE
Staphylococcus aureus: NEGATIVE

## 2017-02-09 SURGERY — LAPAROSCOPIC CHOLECYSTECTOMY
Anesthesia: General | Wound class: Clean Contaminated

## 2017-02-09 MED ORDER — LIDOCAINE HCL (PF) 2 % IJ SOLN
INTRAMUSCULAR | Status: AC
  Filled 2017-02-09: qty 10

## 2017-02-09 MED ORDER — DEXAMETHASONE SODIUM PHOSPHATE 10 MG/ML IJ SOLN
INTRAMUSCULAR | Status: AC
  Filled 2017-02-09: qty 1

## 2017-02-09 MED ORDER — PANTOPRAZOLE SODIUM 40 MG IV SOLR
40.0000 mg | Freq: Every day | INTRAVENOUS | Status: DC
  Administered 2017-02-09 – 2017-02-13 (×5): 40 mg via INTRAVENOUS
  Filled 2017-02-09 (×5): qty 40

## 2017-02-09 MED ORDER — DIPHENHYDRAMINE HCL 12.5 MG/5ML PO ELIX
12.5000 mg | ORAL_SOLUTION | Freq: Four times a day (QID) | ORAL | Status: DC | PRN
  Administered 2017-02-11 – 2017-02-12 (×2): 12.5 mg via ORAL
  Filled 2017-02-09 (×3): qty 5

## 2017-02-09 MED ORDER — FENTANYL CITRATE (PF) 100 MCG/2ML IJ SOLN
INTRAMUSCULAR | Status: AC
  Filled 2017-02-09: qty 2

## 2017-02-09 MED ORDER — PHENYLEPHRINE HCL 10 MG/ML IJ SOLN
INTRAMUSCULAR | Status: DC | PRN
  Administered 2017-02-09: 100 ug via INTRAVENOUS

## 2017-02-09 MED ORDER — ACETAMINOPHEN 10 MG/ML IV SOLN
INTRAVENOUS | Status: AC
  Filled 2017-02-09: qty 100

## 2017-02-09 MED ORDER — INFLUENZA VAC SPLIT HIGH-DOSE 0.5 ML IM SUSY
0.5000 mL | PREFILLED_SYRINGE | INTRAMUSCULAR | Status: AC
  Administered 2017-02-11: 0.5 mL via INTRAMUSCULAR
  Filled 2017-02-09: qty 0.5

## 2017-02-09 MED ORDER — FENTANYL CITRATE (PF) 100 MCG/2ML IJ SOLN
INTRAMUSCULAR | Status: AC
Start: 2017-02-09 — End: 2017-02-09
  Administered 2017-02-09: 25 ug via INTRAVENOUS
  Filled 2017-02-09: qty 2

## 2017-02-09 MED ORDER — FAMOTIDINE 20 MG PO TABS
ORAL_TABLET | ORAL | Status: AC
  Administered 2017-02-09: 20 mg via ORAL
  Filled 2017-02-09: qty 1

## 2017-02-09 MED ORDER — SUCCINYLCHOLINE CHLORIDE 20 MG/ML IJ SOLN
INTRAMUSCULAR | Status: DC | PRN
  Administered 2017-02-09: 80 mg via INTRAVENOUS

## 2017-02-09 MED ORDER — HYDRALAZINE HCL 20 MG/ML IJ SOLN: 10.0000 mg | INTRAMUSCULAR | Status: DC | PRN

## 2017-02-09 MED ORDER — IOTHALAMATE MEGLUMINE 60 % INJ SOLN
INTRAMUSCULAR | Status: DC | PRN
  Administered 2017-02-09: 10 mL via SURGICAL_CAVITY

## 2017-02-09 MED ORDER — PROPOFOL 10 MG/ML IV BOLUS
INTRAVENOUS | Status: DC | PRN
  Administered 2017-02-09: 150 mg via INTRAVENOUS

## 2017-02-09 MED ORDER — ONDANSETRON 4 MG PO TBDP: 4.0000 mg | ORAL_TABLET | Freq: Four times a day (QID) | ORAL | Status: DC | PRN

## 2017-02-09 MED ORDER — LACTATED RINGERS IV SOLN
INTRAVENOUS | Status: DC
Start: 2017-02-09 — End: 2017-02-10
  Administered 2017-02-09 – 2017-02-10 (×3): via INTRAVENOUS

## 2017-02-09 MED ORDER — ONDANSETRON HCL 4 MG/2ML IJ SOLN
INTRAMUSCULAR | Status: AC
Start: 2017-02-09 — End: ?
  Filled 2017-02-09: qty 2

## 2017-02-09 MED ORDER — ONDANSETRON HCL 4 MG/2ML IJ SOLN: 4.0000 mg | Freq: Once | INTRAMUSCULAR | Status: DC | PRN

## 2017-02-09 MED ORDER — CIPROFLOXACIN IN D5W 400 MG/200ML IV SOLN
INTRAVENOUS | Status: AC
  Filled 2017-02-09: qty 200

## 2017-02-09 MED ORDER — BUPIVACAINE-EPINEPHRINE (PF) 0.25% -1:200000 IJ SOLN
INTRAMUSCULAR | Status: DC | PRN
  Administered 2017-02-09: 30 mL via PERINEURAL

## 2017-02-09 MED ORDER — DEXAMETHASONE SODIUM PHOSPHATE 10 MG/ML IJ SOLN
INTRAMUSCULAR | Status: DC | PRN
  Administered 2017-02-09: 4 mg via INTRAVENOUS

## 2017-02-09 MED ORDER — SUGAMMADEX SODIUM 200 MG/2ML IV SOLN
INTRAVENOUS | Status: AC
  Filled 2017-02-09: qty 2

## 2017-02-09 MED ORDER — HYDROCODONE-ACETAMINOPHEN 5-325 MG PO TABS
1.0000 | ORAL_TABLET | ORAL | Status: DC | PRN
  Administered 2017-02-09 – 2017-02-10 (×3): 1 via ORAL
  Administered 2017-02-11 – 2017-02-12 (×2): 2 via ORAL
  Administered 2017-02-12: 1 via ORAL
  Filled 2017-02-09: qty 2
  Filled 2017-02-09 (×3): qty 1
  Filled 2017-02-09: qty 2
  Filled 2017-02-09: qty 1

## 2017-02-09 MED ORDER — PROPOFOL 10 MG/ML IV BOLUS
INTRAVENOUS | Status: AC
Start: 2017-02-09 — End: ?
  Filled 2017-02-09: qty 20

## 2017-02-09 MED ORDER — ROCURONIUM BROMIDE 50 MG/5ML IV SOLN
INTRAVENOUS | Status: AC
Start: 2017-02-09 — End: ?
  Filled 2017-02-09: qty 1

## 2017-02-09 MED ORDER — ONDANSETRON HCL 4 MG/2ML IJ SOLN: 4.0000 mg | Freq: Four times a day (QID) | INTRAMUSCULAR | Status: DC | PRN

## 2017-02-09 MED ORDER — DEXTROSE IN LACTATED RINGERS 5 % IV SOLN
INTRAVENOUS | Status: DC
  Administered 2017-02-09 (×2): via INTRAVENOUS

## 2017-02-09 MED ORDER — GLYCOPYRROLATE 0.2 MG/ML IJ SOLN
INTRAMUSCULAR | Status: AC
  Filled 2017-02-09: qty 1

## 2017-02-09 MED ORDER — PROCHLORPERAZINE EDISYLATE 5 MG/ML IJ SOLN
5.0000 mg | Freq: Four times a day (QID) | INTRAMUSCULAR | Status: DC | PRN
  Filled 2017-02-09: qty 2

## 2017-02-09 MED ORDER — LIDOCAINE HCL (CARDIAC) 20 MG/ML IV SOLN
INTRAVENOUS | Status: DC | PRN
  Administered 2017-02-09: 100 mg via INTRAVENOUS

## 2017-02-09 MED ORDER — MORPHINE SULFATE (PF) 4 MG/ML IV SOLN
4.0000 mg | INTRAVENOUS | Status: DC | PRN
  Administered 2017-02-10: 4 mg via INTRAVENOUS
  Filled 2017-02-09 (×2): qty 1

## 2017-02-09 MED ORDER — ROCURONIUM BROMIDE 100 MG/10ML IV SOLN
INTRAVENOUS | Status: DC | PRN
  Administered 2017-02-09: 5 mg via INTRAVENOUS
  Administered 2017-02-09: 30 mg via INTRAVENOUS

## 2017-02-09 MED ORDER — METOPROLOL TARTRATE 5 MG/5ML IV SOLN
5.0000 mg | Freq: Four times a day (QID) | INTRAVENOUS | Status: DC | PRN
  Filled 2017-02-09: qty 5

## 2017-02-09 MED ORDER — ACETAMINOPHEN 10 MG/ML IV SOLN
INTRAVENOUS | Status: DC | PRN
Start: 2017-02-09 — End: 2017-02-09
  Administered 2017-02-09: 1000 mg via INTRAVENOUS

## 2017-02-09 MED ORDER — BUPIVACAINE-EPINEPHRINE (PF) 0.25% -1:200000 IJ SOLN
INTRAMUSCULAR | Status: AC
  Filled 2017-02-09: qty 30

## 2017-02-09 MED ORDER — DIPHENHYDRAMINE HCL 50 MG/ML IJ SOLN: 12.5000 mg | Freq: Four times a day (QID) | INTRAMUSCULAR | Status: DC | PRN

## 2017-02-09 MED ORDER — FAMOTIDINE 20 MG PO TABS
20.0000 mg | ORAL_TABLET | Freq: Once | ORAL | Status: AC
  Administered 2017-02-09: 20 mg via ORAL

## 2017-02-09 MED ORDER — CIPROFLOXACIN IN D5W 400 MG/200ML IV SOLN
400.0000 mg | Freq: Two times a day (BID) | INTRAVENOUS | Status: DC
  Administered 2017-02-09 – 2017-02-13 (×11): 400 mg via INTRAVENOUS
  Filled 2017-02-09 (×12): qty 200

## 2017-02-09 MED ORDER — FENTANYL CITRATE (PF) 100 MCG/2ML IJ SOLN
INTRAMUSCULAR | Status: DC | PRN
  Administered 2017-02-09: 100 ug via INTRAVENOUS
  Administered 2017-02-09: 50 ug via INTRAVENOUS

## 2017-02-09 MED ORDER — FENTANYL CITRATE (PF) 100 MCG/2ML IJ SOLN
25.0000 ug | INTRAMUSCULAR | Status: DC | PRN
  Administered 2017-02-09 (×3): 25 ug via INTRAVENOUS

## 2017-02-09 MED ORDER — ATENOLOL 12.5 MG HALF TABLET
12.5000 mg | ORAL_TABLET | Freq: Every day | ORAL | Status: DC
  Administered 2017-02-09 – 2017-02-10 (×2): 12.5 mg via ORAL
  Filled 2017-02-09 (×2): qty 1

## 2017-02-09 MED ORDER — PROPOFOL 500 MG/50ML IV EMUL
INTRAVENOUS | Status: DC | PRN
  Administered 2017-02-09: 150 ug/kg/min via INTRAVENOUS

## 2017-02-09 MED ORDER — PROCHLORPERAZINE MALEATE 10 MG PO TABS
10.0000 mg | ORAL_TABLET | Freq: Four times a day (QID) | ORAL | Status: DC | PRN
  Filled 2017-02-09: qty 1

## 2017-02-09 MED ORDER — SUGAMMADEX SODIUM 200 MG/2ML IV SOLN
INTRAVENOUS | Status: DC | PRN
  Administered 2017-02-09: 150 mg via INTRAVENOUS

## 2017-02-09 MED ORDER — BUPIVACAINE HCL (PF) 0.25 % IJ SOLN
INTRAMUSCULAR | Status: AC
  Filled 2017-02-09: qty 10

## 2017-02-09 MED ORDER — PROPOFOL 500 MG/50ML IV EMUL
INTRAVENOUS | Status: AC
  Filled 2017-02-09: qty 50

## 2017-02-09 MED ORDER — SUCCINYLCHOLINE CHLORIDE 20 MG/ML IJ SOLN
INTRAMUSCULAR | Status: AC
  Filled 2017-02-09: qty 1

## 2017-02-09 SURGICAL SUPPLY — 42 items
ADHESIVE MASTISOL STRL (MISCELLANEOUS) ×2 IMPLANT
APPLIER CLIP ROT 10 11.4 M/L (STAPLE) ×2
BLADE SURG SZ11 CARB STEEL (BLADE) ×2 IMPLANT
CANISTER SUCT 1200ML W/VALVE (MISCELLANEOUS) ×2 IMPLANT
CATH CHOLANGI 4FR 420404F (CATHETERS) ×2 IMPLANT
CHLORAPREP W/TINT 26ML (MISCELLANEOUS) ×2 IMPLANT
CLIP APPLIE ROT 10 11.4 M/L (STAPLE) ×1 IMPLANT
CONRAY 60ML FOR OR (MISCELLANEOUS) ×2 IMPLANT
DRAPE C-ARM XRAY 36X54 (DRAPES) ×2 IMPLANT
ELECT REM PT RETURN 9FT ADLT (ELECTROSURGICAL) ×2
ELECTRODE REM PT RTRN 9FT ADLT (ELECTROSURGICAL) ×1 IMPLANT
GAUZE SPONGE NON-WVN 2X2 STRL (MISCELLANEOUS) ×4 IMPLANT
GLOVE BIO SURGEON STRL SZ8 (GLOVE) ×2 IMPLANT
GOWN STRL REUS W/ TWL LRG LVL3 (GOWN DISPOSABLE) ×4 IMPLANT
GOWN STRL REUS W/TWL LRG LVL3 (GOWN DISPOSABLE) ×8
IRRIGATION STRYKERFLOW (MISCELLANEOUS) ×1 IMPLANT
IRRIGATOR STRYKERFLOW (MISCELLANEOUS) ×2
IV CATH ANGIO 12GX3 LT BLUE (NEEDLE) ×2 IMPLANT
IV NS 1000ML (IV SOLUTION) ×2
IV NS 1000ML BAXH (IV SOLUTION) ×1 IMPLANT
JACKSON PRATT 10 (INSTRUMENTS) ×2 IMPLANT
KIT RM TURNOVER STRD PROC AR (KITS) ×2 IMPLANT
LABEL OR SOLS (LABEL) ×2 IMPLANT
NDL SAFETY 22GX1.5 (NEEDLE) ×2 IMPLANT
NEEDLE VERESS 14GA 120MM (NEEDLE) ×2 IMPLANT
NS IRRIG 500ML POUR BTL (IV SOLUTION) ×2 IMPLANT
PACK LAP CHOLECYSTECTOMY (MISCELLANEOUS) ×2 IMPLANT
POUCH SPECIMEN RETRIEVAL 10MM (ENDOMECHANICALS) ×2 IMPLANT
SCISSORS METZENBAUM CVD 33 (INSTRUMENTS) ×2 IMPLANT
SLEEVE ENDOPATH XCEL 5M (ENDOMECHANICALS) ×4 IMPLANT
SPONGE LAP 18X18 5 PK (GAUZE/BANDAGES/DRESSINGS) ×2 IMPLANT
SPONGE VERSALON 2X2 STRL (MISCELLANEOUS) ×8
SPONGE VERSALON 4X4 4PLY (MISCELLANEOUS) ×2 IMPLANT
STRIP CLOSURE SKIN 1/2X4 (GAUZE/BANDAGES/DRESSINGS) ×2 IMPLANT
SUT MNCRL 4-0 (SUTURE) ×2
SUT MNCRL 4-0 27XMFL (SUTURE) ×1
SUT VICRYL 0 AB UR-6 (SUTURE) ×2 IMPLANT
SUTURE MNCRL 4-0 27XMF (SUTURE) ×1 IMPLANT
SYR 20CC LL (SYRINGE) ×2 IMPLANT
TROCAR XCEL NON-BLD 11X100MML (ENDOMECHANICALS) ×2 IMPLANT
TROCAR XCEL NON-BLD 5MMX100MML (ENDOMECHANICALS) ×2 IMPLANT
TUBING INSUFFLATOR HI FLOW (MISCELLANEOUS) ×2 IMPLANT

## 2017-02-09 NOTE — Anesthesia Postprocedure Evaluation (Signed)
Anesthesia Post Note  Patient: Thomas [name]  Procedure(s) Performed: LAPAROSCOPIC CHOLECYSTECTOMY (N/A )  Patient location during evaluation: PACU Anesthesia Type: General Level of consciousness: awake and alert Pain management: pain level controlled Vital Signs Assessment: post-procedure vital signs reviewed and stable Respiratory status: spontaneous breathing, nonlabored ventilation, respiratory function stable and patient connected to nasal cannula oxygen Cardiovascular status: blood pressure returned to baseline and stable Postop Assessment: no apparent nausea or vomiting Anesthetic complications: no     Last Vitals:  Vitals:   02/09/17 1039 02/09/17 1123  BP:  128/65  Pulse:  63  Resp:  12  Temp: 37.1 C 37.1 C  SpO2:  98%    Last Pain:  Vitals:   02/09/17 1123  TempSrc: Oral  PainSc: 4                  Lenard Simmer

## 2017-02-09 NOTE — Progress Notes (Signed)
Preoperative Review   Patient is met in the preoperative holding area. The history is reviewed in the chart and with the patient. I personally reviewed the options and rationale as well as the risks of this procedure that have been previously discussed with the patient. All questions asked by the patient and/or family were answered to their satisfaction.  Patient agrees to proceed with this procedure at this time.  Drinda Belgard E Nihaal Friesen M.D. FACS  

## 2017-02-09 NOTE — Transfer of Care (Signed)
Immediate Anesthesia Transfer of Care Note  Patient: Thomas [name]  Procedure(s) Performed: LAPAROSCOPIC CHOLECYSTECTOMY (N/A )  Patient Location: PACU  Anesthesia Type:General  Level of Consciousness: sedated and responds to stimulation  Airway & Oxygen Therapy: Patient Spontanous Breathing and Patient connected to face mask oxygen  Post-op Assessment: Report given to RN and Post -op Vital signs reviewed and stable  Post vital signs: Reviewed and stable  Last Vitals:  Vitals:   02/09/17 0822 02/09/17 0954  BP: 115/60 139/67  Pulse: 69 72  Resp: 18   Temp: 36.5 C   SpO2: 100% 99%    Last Pain:  Vitals:   02/09/17 0822  TempSrc: Tympanic  PainSc:          Complications: No apparent anesthesia complications

## 2017-02-09 NOTE — ED Notes (Signed)
Called the floor to let them know patient on the way

## 2017-02-09 NOTE — ED Provider Notes (Signed)
-----------------------------------------   1:26 AM on 02/09/2017 -----------------------------------------   Blood pressure (!) 143/51, pulse 74, temperature 98.2 F (36.8 C), temperature source Oral, resp. rate 15, height 5' 10.5" (1.791 m), weight 73 kg (161 lb), SpO2 98 %.  Assuming care from Dr. Pershing Proud.  In short, Thomas [name] is a 71 y.o. male with a chief complaint of Abdominal Pain and Emesis .  Refer to the original H&P for additional details.  The current plan of care is to follow up the results of the CT scan.      I reviewed the results of the CT scan and there was a concern for cholecystitis. Some vague soft tissue inflammation was seen about the gallbladder. I then decided to send the patient for an ultrasound for further evaluation of his gallbladder. It appears that the patient has some mild gallbladder wall thickening with stones and sludge.  Given the results as well as the patient's white blood cell count of 13 I decided to contact the surgeon for further evaluation. The patient's pain is improved at this time. The patient will be evaluated by Dr. Tonita Cong for cholecystitis and admitted.   Rebecka Apley, MD 02/09/17 [phone]

## 2017-02-09 NOTE — Op Note (Signed)
Laparoscopic Cholecystectomy  Pre-operative Diagnosis: Acute cholecystitis  Post-operative Diagnosis: Acute cholecystitis  Procedure: Laparoscopic cholecystectomy with C-arm fluoroscopic angiography  Surgeon: Adah Salvage. Excell Seltzer, MD FACS  Anesthesia: Gen. with endotracheal tube  Assistant: RN  Procedure Details  The patient was seen again in the Holding Room. The benefits, complications, treatment options, and expected outcomes were discussed with the patient. The risks of bleeding, infection, recurrence of symptoms, failure to resolve symptoms, bile duct damage, bile duct leak, retained common bile duct stone, bowel injury, any of which could require further surgery and/or ERCP, stent, or papillotomy were reviewed with the patient. The likelihood of improving the patient's symptoms with return to their baseline status is good.  The patient and/or family concurred with the proposed plan, giving informed consent.  The patient was taken to Operating Room, identified as Thomas [name] and the procedure verified as Laparoscopic Cholecystectomy.  A Time Out was held and the above information confirmed.  Prior to the induction of general anesthesia, antibiotic prophylaxis was administered. VTE prophylaxis was in place. General endotracheal anesthesia was then administered and tolerated well. After the induction, the abdomen was prepped with Chloraprep and draped in the sterile fashion. The patient was positioned in the supine position.  Local anesthetic  was injected into the skin near the umbilicus and an incision made. The Veress needle was placed. Pneumoperitoneum was then created with CO2 and tolerated well without any adverse changes in the patient's vital signs. A 5mm port was placed in the periumbilical position and the abdominal cavity was explored.  Two 5-mm ports were placed in the right upper quadrant and a 12 mm epigastric port was placed all under direct vision. All skin incisions  were  infiltrated with a local anesthetic agent before making the incision and placing the trocars.   The patient was positioned  in reverse Trendelenburg, tilted slightly to the patient's left.  The gallbladder was identified, the fundus grasped and retracted cephalad. Adhesions were lysed bluntly. The infundibulum was grasped and retracted laterally, exposing the peritoneum overlying the triangle of Calot. This was then divided and exposed in a blunt fashion. A critical view of the cystic duct and cystic artery was obtained.  The cystic duct was clearly identified and bluntly dissected.   The cystic duct was clipped and incised and through a separate incision and Angiocath a cholangiogram catheter was placed. C-arm fluoroscopic angiography demonstrated good flow into the duodenum the pancreatic duct was slightly filled the proximal ducts were well identified and the cystic duct had been cannulated. There were no intraluminal filling defects. The common bile duct was not dilated.  The cystic duct was then doubly clipped and divided after removing the catheter. The cystic artery was doubly clipped and divided. Another branch up on the gallbladder was doubly clipped and divided.  The gallbladder was taken from the gallbladder fossa in a retrograde fashion with the electrocautery. The gallbladder was removed and placed in an Endocatch bag. The liver bed was irrigated and inspected. Hemostasis was achieved with the electrocautery. Copious irrigation was utilized and was repeatedly aspirated until clear.  The gallbladder and Endocatch sac were then removed through the epigastric port site.   Hemostasis was with electrocautery at this point and a 10 mm drain was placed into the foramen of Winslow brought out through a lateral port site and held in with 3-0 nylon.  Inspection of the right upper quadrant was performed. No bleeding, bile duct injury or leak, or bowel injury was noted.  Pneumoperitoneum was released.   The epigastric port site was closed with figure-of-eight 0 Vicryl sutures. 4-0 subcuticular Monocryl was used to close the skin. Steristrips and Mastisol and sterile dressings were  applied.  The patient was then extubated and brought to the recovery room in stable condition. Sponge, lap, and needle counts were correct at closure and at the conclusion of the case.   Findings: Acute Cholecystitis   Estimated Blood Loss: 25 cc         Drains: JP 1         Specimens: Gallbladder           Complications: none               Gurpreet Mikhail E. Excell Seltzer, MD, FACS
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
## 2017-02-09 NOTE — Anesthesia Procedure Notes (Signed)
Procedure Name: Intubation Performed by: Lance Muss Pre-anesthesia Checklist: Patient identified, Patient being monitored, Timeout performed, Emergency Drugs available and Suction available Patient Re-evaluated:Patient Re-evaluated prior to induction Oxygen Delivery Method: Circle system utilized Preoxygenation: Pre-oxygenation with 100% oxygen Induction Type: IV induction Ventilation: Mask ventilation without difficulty and Oral airway inserted - appropriate to patient size Laryngoscope Size: Mac and 3 Grade View: Grade II Tube type: Oral Tube size: 7.5 mm Number of attempts: 1 Airway Equipment and Method: Stylet Placement Confirmation: ETT inserted through vocal cords under direct vision,  positive ETCO2 and breath sounds checked- equal and bilateral Secured at: 23 cm Tube secured with: Tape Dental Injury: Teeth and Oropharynx as per pre-operative assessment

## 2017-02-09 NOTE — Anesthesia Post-op Follow-up Note (Signed)
Anesthesia QCDR form completed.        

## 2017-02-09 NOTE — ED Notes (Signed)
Pt went to [facility]

## 2017-02-10 ENCOUNTER — Encounter: Payer: Self-pay | Admitting: Surgery

## 2017-02-10 LAB — CBC WITH DIFFERENTIAL/PLATELET
Basophils Absolute: 0 10*3/uL (ref 0–0.1)
Basophils Relative: 0 %
Eosinophils Absolute: 0 10*3/uL (ref 0–0.7)
Eosinophils Relative: 0 %
HCT: 36.7 % — ABNORMAL LOW (ref 40.0–52.0)
Hemoglobin: 12.9 g/dL — ABNORMAL LOW (ref 13.0–18.0)
Lymphocytes Relative: 8 %
Lymphs Abs: 0.9 10*3/uL — ABNORMAL LOW (ref 1.0–3.6)
MCH: 33.2 pg (ref 26.0–34.0)
MCHC: 35.1 g/dL (ref 32.0–36.0)
MCV: 94.7 fL (ref 80.0–100.0)
Monocytes Absolute: 0.3 10*3/uL (ref 0.2–1.0)
Monocytes Relative: 3 %
Neutro Abs: 9.8 10*3/uL — ABNORMAL HIGH (ref 1.4–6.5)
Neutrophils Relative %: 89 %
Platelets: 134 10*3/uL — ABNORMAL LOW (ref 150–440)
RBC: 3.87 MIL/uL — ABNORMAL LOW (ref 4.40–5.90)
RDW: 13.3 % (ref 11.5–14.5)
WBC: 10.9 10*3/uL — ABNORMAL HIGH (ref 3.8–10.6)

## 2017-02-10 LAB — COMPREHENSIVE METABOLIC PANEL
ALT: 35 U/L (ref 17–63)
AST: 43 U/L — ABNORMAL HIGH (ref 15–41)
Albumin: 2.9 g/dL — ABNORMAL LOW (ref 3.5–5.0)
Alkaline Phosphatase: 32 U/L — ABNORMAL LOW (ref 38–126)
Anion gap: 5 (ref 5–15)
BUN: 20 mg/dL (ref 6–20)
CO2: 29 mmol/L (ref 22–32)
Calcium: 8.2 mg/dL — ABNORMAL LOW (ref 8.9–10.3)
Chloride: 101 mmol/L (ref 101–111)
Creatinine, Ser: 0.92 mg/dL (ref 0.61–1.24)
GFR calc Af Amer: >60 mL/min (ref >60)
GFR calc non Af Amer: >60 mL/min (ref >60)
Glucose, Bld: 136 mg/dL — ABNORMAL HIGH (ref 65–99)
Potassium: 3.8 mmol/L (ref 3.5–5.1)
Sodium: 135 mmol/L (ref 135–145)
Total Bilirubin: 2.6 mg/dL — ABNORMAL HIGH (ref 0.3–1.2)
Total Protein: 6.3 g/dL — ABNORMAL LOW (ref 6.5–8.1)

## 2017-02-10 LAB — MAGNESIUM: Magnesium: 1.6 mg/dL — ABNORMAL LOW (ref 1.7–2.4)

## 2017-02-10 LAB — SURGICAL PATHOLOGY

## 2017-02-10 MED ORDER — FLUTICASONE PROPIONATE 50 MCG/ACT NA SUSP
2.0000 | Freq: Every day | NASAL | Status: DC | PRN
  Filled 2017-02-10: qty 16

## 2017-02-10 MED ORDER — LORATADINE 10 MG PO TABS
10.0000 mg | ORAL_TABLET | Freq: Every day | ORAL | Status: DC
  Administered 2017-02-10 – 2017-02-14 (×5): 10 mg via ORAL
  Filled 2017-02-10 (×5): qty 1

## 2017-02-10 MED ORDER — SODIUM CHLORIDE 0.9 % IV BOLUS (SEPSIS)
500.0000 mL | Freq: Once | INTRAVENOUS | Status: AC
  Administered 2017-02-10: 500 mL via INTRAVENOUS

## 2017-02-10 MED ORDER — DILTIAZEM HCL 60 MG PO TABS
60.0000 mg | ORAL_TABLET | Freq: Three times a day (TID) | ORAL | Status: DC
  Administered 2017-02-10 – 2017-02-12 (×5): 60 mg via ORAL
  Filled 2017-02-10 (×8): qty 1

## 2017-02-10 MED ORDER — DIGOXIN 0.25 MG/ML IJ SOLN
0.2500 mg | Freq: Once | INTRAMUSCULAR | Status: DC
  Filled 2017-02-10 (×2): qty 1

## 2017-02-10 MED ORDER — SAW PALMETTO 450 MG PO CAPS: 3.0000 | ORAL_CAPSULE | Freq: Every day | ORAL | Status: DC

## 2017-02-10 MED ORDER — LACTATED RINGERS IV BOLUS (SEPSIS)
1000.0000 mL | Freq: Once | INTRAVENOUS | Status: AC
  Administered 2017-02-10: 1000 mL via INTRAVENOUS

## 2017-02-10 MED ORDER — ACETAMINOPHEN 325 MG PO TABS
650.0000 mg | ORAL_TABLET | Freq: Once | ORAL | Status: AC
  Administered 2017-02-10: 650 mg via ORAL
  Filled 2017-02-10: qty 2

## 2017-02-10 MED ORDER — KETOTIFEN FUMARATE 0.025 % OP SOLN
1.0000 [drp] | Freq: Two times a day (BID) | OPHTHALMIC | Status: DC | PRN
  Filled 2017-02-10: qty 5

## 2017-02-10 NOTE — Progress Notes (Signed)
Pt HR ws at 130's to 160's. MD was notified and ordered Cardiazem PO . Cardiazem given. Will continue to monitor

## 2017-02-10 NOTE — Progress Notes (Signed)
Call to patient with fever and increased heart rate. Patient states that he is comfortable and having minimal pain he is up in the lounge chair. Vital signs reviewed. Heart rate is up He has had good urine output 550 in the last shift. His urine output is not being recorded at this time however.  No bile in drain.  Will give fluid bolus as patient has been febrile and that is likely the source of his fever and low blood pressure.

## 2017-02-10 NOTE — Progress Notes (Signed)
PHARMACIST - PHYSICIAN ORDER COMMUNICATION  CONCERNING: P&T Medication Policy on Herbal Medications  DESCRIPTION:  This patient's order for:  Saw Palmetto  has been noted.  This product(s) is classified as an "herbal" or natural product. Due to a lack of definitive safety studies or FDA approval, nonstandard manufacturing practices, plus the potential risk of unknown drug-drug interactions while on inpatient medications, the Pharmacy and Therapeutics Committee does not permit the use of "herbal" or natural products of this type within [facility].   ACTION TAKEN: The pharmacy department is unable to verify this order at this time. Please reevaluate patient's clinical condition at discharge and address if the herbal or natural product(s) should be resumed at that time.  Luisa Hart, PharmD Clinical Pharmacist

## 2017-02-10 NOTE — Progress Notes (Signed)
Patient temp was 102.1 at 12:09 MD notified and ordered tylenol.

## 2017-02-10 NOTE — Care Management Obs Status (Signed)
MEDICARE OBSERVATION STATUS NOTIFICATION   Patient Details  Name: BALIAN SCHALLER MRN: [id number] Date of Birth: 30-Jan-1946   Medicare Observation Status Notification Given:  Yes    Chapman Fitch, RN 02/10/2017, 4:49 PM

## 2017-02-10 NOTE — Progress Notes (Signed)
Nutrition Brief Note  Patient identified on the Malnutrition Screening Tool (MST) Report  71 year old male with acute cholecystitis. Pt now s/p laparoscopic cholecystectomy with C-arm fluoroscopic angiography  Wt Readings from Last 15 Encounters:  02/08/17 161 lb (73 kg)  11/30/16 168 lb 12.8 oz (76.6 kg)  07/20/16 175 lb (79.4 kg)  05/31/16 171 lb (77.6 kg)  11/25/15 175 lb 4 oz (79.5 kg)  11/25/15 175 lb (79.4 kg)  10/15/15 174 lb (78.9 kg)  09/21/15 168 lb (76.2 kg)  05/26/15 173 lb (78.5 kg)  12/08/14 172 lb (78 kg)  11/20/14 173 lb (78.5 kg)  05/22/14 183 lb (83 kg)    Body mass index is 22.77 kg/m. Patient meets criteria for normal weight based on current BMI.   Current diet order is regular, patient is consuming approximately 75-100% of meals at this time. Labs and medications reviewed.   Pt to possibly discharge tomorrow per MD note  No nutrition interventions warranted at this time. If nutrition issues arise, please consult RD.   Betsey Holiday MS, RD, LDN Pager #- [phone] After Hours Pager: [phone]

## 2017-02-10 NOTE — Progress Notes (Signed)
Pt was to have Digoxin 0.25mg  (injection) at 1900, but HR was running around 86 to 90's. MD was notified. MD ordered digoxin to be discontinued.

## 2017-02-10 NOTE — Progress Notes (Signed)
1 Day Post-Op  Subjective: She feels much better today less pain no nausea or vomiting no fevers or chills following laparoscopic cholecystectomy for acute cholecystitis  Objective: Vital signs in last 24 hours: Temp:  [98.3 F (36.8 C)-99.5 F (37.5 C)] 99.5 F (37.5 C) (10/12 0549) Pulse Rate:  [63-85] 85 (10/12 0549) Resp:  [12-20] 20 (10/12 0549) BP: (127-139)/(64-73) 132/73 (10/12 0549) SpO2:  [95 %-100 %] 95 % (10/12 0549) Last BM Date: 02/08/17  Intake/Output from previous day: 10/11 0701 - 10/12 0700 In: 4413 [P.O.:920; I.V.:3043; IV Piggyback:400] Out: 600 [Urine:550; Drains:40; Blood:10] Intake/Output this shift: No intake/output data recorded.  Physical exam:  No icterus no jaundice abdomen is soft nontender no bile in drain wounds are dressed. Nontender calves  Lab Results: CBC   Recent Labs  02/08/17 2112 02/10/17 0309  WBC 13.8* 10.9*  HGB 15.4 12.9*  HCT 44.5 36.7*  PLT 206 134*   BMET  Recent Labs  02/08/17 2112 02/10/17 0309  NA 135 135  K 3.3* 3.8  CL 94* 101  CO2 27 29  GLUCOSE 144* 136*  BUN 22* 20  CREATININE 1.08 0.92  CALCIUM 9.6 8.2*   PT/INR No results for input(s): LABPROT, INR in the last 72 hours. ABG No results for input(s): PHART, HCO3 in the last 72 hours.  Invalid input(s): PCO2, PO2  Studies/Results: Dg Cholangiogram Operative  Result Date: 02/09/2017 CLINICAL DATA:  Acute cholecystitis EXAM: INTRAOPERATIVE CHOLANGIOGRAM TECHNIQUE: Cholangiographic images from the C-arm fluoroscopic device were submitted for interpretation post-operatively. Please see the procedural report for the amount of contrast and the fluoroscopy time utilized. COMPARISON:  02/09/2017 FINDINGS: Intraoperative cholangiogram performed during the laparoscopic procedure. The biliary confluence, common hepatic duct, residual cystic duct, and common bile duct are patent. Contrast does eventually drain into the duodenum. There is also reflux of contrast  into the pancreatic duct. IMPRESSION: Patent biliary system. Electronically Signed   By: Judie Petit.  Shick M.D.   On: 02/09/2017 09:57   Ct Abdomen Pelvis W Contrast  Result Date: 02/08/2017 CLINICAL DATA:  Acute onset of generalized abdominal pain, nausea and vomiting. Initial encounter. EXAM: CT ABDOMEN AND PELVIS WITH CONTRAST TECHNIQUE: Multidetector CT imaging of the abdomen and pelvis was performed using the standard protocol following bolus administration of intravenous contrast. CONTRAST:  ISOVUE-300 IOPAMIDOL (ISOVUE-300) INJECTION 61% COMPARISON:  CT of the abdomen and pelvis performed 08/26/2010 FINDINGS: Lower chest: Scattered coronary artery calcifications are seen. The visualized lung bases are clear. Hepatobiliary: The liver is unremarkable in appearance. Vague soft tissue inflammation is noted about the gallbladder, raising concern for acute cholecystitis. The common bile duct remains grossly normal in caliber. Pancreas: The pancreas is within normal limits. Spleen: The spleen is unremarkable in appearance. Adrenals/Urinary Tract: The adrenal glands are unremarkable in appearance. Bilateral renal cysts are noted, more prominent on the left. Mild nonspecific perinephric stranding is noted bilaterally. There is no evidence of hydronephrosis. No renal or ureteral stones are identified. Stomach/Bowel: The stomach is unremarkable in appearance. The small bowel is within normal limits. The appendix is normal in caliber, without evidence of appendicitis. Scattered diverticulosis is noted along the descending and sigmoid colon, without evidence of diverticulitis. Vascular/Lymphatic: Diffuse calcification is seen along the abdominal aorta and its branches. The abdominal aorta is otherwise grossly unremarkable. The inferior vena cava is grossly unremarkable. No retroperitoneal lymphadenopathy is seen. No pelvic sidewall lymphadenopathy is identified. Reproductive: The bladder is mildly distended and grossly  unremarkable. The prostate remains normal in size. Other:  Postoperative change is noted along the anterior aspect of the lower abdominal and pelvic peritoneum. Musculoskeletal: No acute osseous abnormalities are identified. Multilevel vacuum phenomenon is noted at the lower thoracic and lumbar spine. The visualized musculature is unremarkable in appearance. IMPRESSION: 1. Vague soft tissue inflammation about the gallbladder, concerning for acute cholecystitis. 2. Scattered coronary artery calcifications. 3. Bilateral renal cysts, more prominent on the left. 4. Scattered diverticulosis along the descending and sigmoid colon, without evidence of diverticulitis. 5. Diffuse aortic atherosclerosis. Electronically Signed   By: Roanna Raider M.D.   On: 02/08/2017 23:10   US Abdomen Limited Ruq  Result Date: 02/09/2017 CLINICAL DATA:  Acute onset of generalized abdominal pain, nausea and vomiting. Leukocytosis and mildly elevated total bilirubin. Initial encounter. EXAM: ULTRASOUND ABDOMEN LIMITED RIGHT UPPER QUADRANT COMPARISON:  CT of the abdomen and pelvis performed 02/08/2017 FINDINGS: Gallbladder: Mild gallbladder wall thickening is noted. Sludge and stones are noted within the gallbladder. Stones measure up to 4 mm in size. No pericholecystic fluid is seen. Evaluation for ultrasonographic Murphy's sign is limited as the patient is on pain medication. Common bile duct: Diameter: 0.4 cm, within normal limits in caliber. Liver: No focal lesion identified. Within normal limits in parenchymal echogenicity. Portal vein is patent on color Doppler imaging with normal direction of blood flow towards the liver. IMPRESSION: Mild gallbladder wall thickening, with stones and sludge in the gallbladder. Evaluation for ultrasonographic Murphy's sign is limited as the patient is on pain medication. Given the patient's symptoms and the appearance on CT, this may reflect mild acute cholecystitis. Electronically Signed   By:  Roanna Raider M.D.   On: 02/09/2017 01:01    Anti-infectives: Anti-infectives    Start     Dose/Rate Route Frequency Ordered Stop   02/09/17 0833  ciprofloxacin (CIPRO) 400 MG/200ML IVPB    Comments:  Carma Lair   : cabinet override      02/09/17 1610 02/09/17 0907   02/09/17 0315  ciprofloxacin (CIPRO) IVPB 400 mg     400 mg 200 mL/hr over 60 Minutes Intravenous Every 12 hours 02/09/17 0306        Assessment/Plan: s/p Procedure(s): LAPAROSCOPIC CHOLECYSTECTOMY   Bilirubin slightly elevated but was elevated preoperatively with a normal cholangiogram. AST ALT are normal. We'll advance diet. Patient may shower continue IV antibiotics for now will likely be able to go home tomorrow and have drain removed prior to discharge  Lattie Haw, MD, FACS  02/10/2017

## 2017-02-10 NOTE — Progress Notes (Signed)
Pt BP was 85/65 at 14:10. MD notified and ordered Lactated Ringer Bolus. Continue to monitor Intake and Output
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
## 2017-02-10 NOTE — Progress Notes (Signed)
Visited patient. He is in the lounge chair and comfortable he has no chest pain and no sortness of breath.  I took his pulse and it was very irregularly irregular suggesting atrial fibrillation. When questioned he states that he has been in atrial fibrillation previously and saw a cardiologist in the past.  Boluses being administered right now. We'll get EKG to confirm atrial fibrillation and I'll ask internal medicine to see the patient

## 2017-02-11 DIAGNOSIS — I480 Paroxysmal atrial fibrillation: Secondary | ICD-10-CM | POA: Diagnosis not present

## 2017-02-11 LAB — CBC WITH DIFFERENTIAL/PLATELET
Basophils Absolute: 0 10*3/uL (ref 0–0.1)
Basophils Relative: 0 %
Eosinophils Absolute: 0.1 10*3/uL (ref 0–0.7)
Eosinophils Relative: 1 %
HCT: 33.7 % — ABNORMAL LOW (ref 40.0–52.0)
Hemoglobin: 11.7 g/dL — ABNORMAL LOW (ref 13.0–18.0)
Lymphocytes Relative: 9 %
Lymphs Abs: 0.8 10*3/uL — ABNORMAL LOW (ref 1.0–3.6)
MCH: 32.9 pg (ref 26.0–34.0)
MCHC: 34.8 g/dL (ref 32.0–36.0)
MCV: 94.5 fL (ref 80.0–100.0)
Monocytes Absolute: 0.3 10*3/uL (ref 0.2–1.0)
Monocytes Relative: 4 %
Neutro Abs: 7.6 10*3/uL — ABNORMAL HIGH (ref 1.4–6.5)
Neutrophils Relative %: 86 %
Platelets: 116 10*3/uL — ABNORMAL LOW (ref 150–440)
RBC: 3.56 MIL/uL — ABNORMAL LOW (ref 4.40–5.90)
RDW: 13 % (ref 11.5–14.5)
WBC: 8.8 10*3/uL (ref 3.8–10.6)

## 2017-02-11 LAB — COMPREHENSIVE METABOLIC PANEL
ALT: 34 U/L (ref 17–63)
AST: 37 U/L (ref 15–41)
Albumin: 2.5 g/dL — ABNORMAL LOW (ref 3.5–5.0)
Alkaline Phosphatase: 38 U/L (ref 38–126)
Anion gap: 8 (ref 5–15)
BUN: 23 mg/dL — ABNORMAL HIGH (ref 6–20)
CO2: 27 mmol/L (ref 22–32)
Calcium: 8.1 mg/dL — ABNORMAL LOW (ref 8.9–10.3)
Chloride: 98 mmol/L — ABNORMAL LOW (ref 101–111)
Creatinine, Ser: 1 mg/dL (ref 0.61–1.24)
GFR calc Af Amer: >60 mL/min (ref >60)
GFR calc non Af Amer: >60 mL/min (ref >60)
Glucose, Bld: 106 mg/dL — ABNORMAL HIGH (ref 65–99)
Potassium: 3.8 mmol/L (ref 3.5–5.1)
Sodium: 133 mmol/L — ABNORMAL LOW (ref 135–145)
Total Bilirubin: 2.4 mg/dL — ABNORMAL HIGH (ref 0.3–1.2)
Total Protein: 5.8 g/dL — ABNORMAL LOW (ref 6.5–8.1)

## 2017-02-11 MED ORDER — ATENOLOL 12.5 MG HALF TABLET
12.5000 mg | ORAL_TABLET | Freq: Every day | ORAL | Status: DC
  Administered 2017-02-11 – 2017-02-12 (×2): 12.5 mg via ORAL
  Filled 2017-02-11 (×2): qty 1

## 2017-02-11 MED ORDER — MAGNESIUM SULFATE 2 GM/50ML IV SOLN
2.0000 g | Freq: Once | INTRAVENOUS | Status: AC
Start: 2017-02-11 — End: 2017-02-11
  Administered 2017-02-11: 2 g via INTRAVENOUS
  Filled 2017-02-11: qty 50

## 2017-02-11 MED ORDER — APIXABAN 5 MG PO TABS
5.0000 mg | ORAL_TABLET | Freq: Two times a day (BID) | ORAL | Status: DC
  Administered 2017-02-11 – 2017-02-14 (×7): 5 mg via ORAL
  Filled 2017-02-11 (×7): qty 1

## 2017-02-11 NOTE — Progress Notes (Signed)
Notified by central telemetry that pt was in atrial flutter on tele monitor. Pt assessed, asymptomatic. BP stable. HR 72. MD notified.

## 2017-02-11 NOTE — Progress Notes (Signed)
SURGICAL PROGRESS NOTE  Hospital Day(s): 0.   Post op day(s): 2 Days Post-Op.   Interval History: Patient seen and examined, no acute events or new complaints overnight. Patient reports only mild epigastric peri-incisional abdominal pain, tolerating diet, denies palpitations, N/V, fever/chills, CP, or SOB.  Review of Systems:  Constitutional: denies fever, chills  HEENT: denies cough or congestion  Respiratory: denies any shortness of breath  Cardiovascular: denies chest pain or palpitations  Gastrointestinal: abdominal pain, N/V, and bowel function as per interval history Genitourinary: denies burning with urination or urinary frequency Musculoskeletal: denies pain, decreased motor or sensation Integumentary: denies any other rashes or skin discolorations except post-surgical abdominal wounds Neurological: denies HA or vision/hearing changes   Vital signs in last 24 hours: [min-max] current  Temp:  [98.8 F (37.1 C)-102.1 F (38.9 C)] 99.1 F (37.3 C) (10/13 0421) Pulse Rate:  [72-144] 84 (10/13 0421) Resp:  [16-20] 18 (10/13 0421) BP: (85-131)/(49-82) 109/64 (10/13 0421) SpO2:  [95 %-97 %] 97 % (10/13 0421)     Height: 5' 10.5" (179.1 cm) Weight: 161 lb (73 kg) BMI (Calculated): 22.77   Intake/Output this shift:  Total I/O In: 620 [P.O.:120; Other:500] Out: 100 [Urine:100]   Intake/Output last 2 shifts:  @   Physical Exam:  Constitutional: alert, cooperative and no distress  HENT: normocephalic without obvious abnormality  Eyes: PERRL, EOM's grossly intact and symmetric  Neuro: CN II - XII grossly intact and symmetric without deficit  Respiratory: breathing non-labored at rest  Cardiovascular: regular rate and sinus rhythm  Gastrointestinal: soft, mild-/moderate- epigastric peri-incisional tenderness to palpation, and non-distended with JP drain well-secured, moderate serosanguinous drainage, incisions well-approximated without erythema or  drainage Musculoskeletal: UE and LE FROM, no edema or wounds, motor and sensation grossly intact, NT   Labs:  CBC Latest Ref Rng & Units 02/11/2017 02/10/2017 02/08/2017  WBC 3.8 - 10.6 K/uL 8.8 10.9(H) 13.8(H)  Hemoglobin 13.0 - 18.0 g/dL 11.7(L) 12.9(L) 15.4  Hematocrit 40.0 - 52.0 % 33.7(L) 36.7(L) 44.5  Platelets 150 - 440 K/uL 116(L) 134(L) 206   CMP Latest Ref Rng & Units 02/11/2017 02/10/2017 02/08/2017  Glucose 65 - 99 mg/dL 409(W) 119(J) 478(G)  BUN 6 - 20 mg/dL 95(A) 20 21(H)  Creatinine 0.61 - 1.24 mg/dL 0.86 5.78 4.69  Sodium 135 - 145 mmol/L 133(L) 135 135  Potassium 3.5 - 5.1 mmol/L 3.8 3.8 3.3(L)  Chloride 101 - 111 mmol/L 98(L) 101 94(L)  CO2 22 - 32 mmol/L Calcium 8.9 - 10.3 mg/dL 8.1(L) 8.2(L) 9.6  Total Protein 6.5 - 8.1 g/dL 6.2(X) 6.3(L) 8.1  Total Bilirubin 0.3 - 1.2 mg/dL 2.4(H) 2.6(H) 1.4(H)  Alkaline Phos 38 - 126 U/L 38 32(L) 46  AST 15 - 41 U/L 37 43(H) 32  ALT 17 - 63 U/L 34 35 23   Imaging studies: No new pertinent imaging studies   Assessment/Plan: (ICD-10's: K60.2) 71 y.o. male with atrial fibrillation recognized previously and again this admission on POD#1, otherwise doing well 2 Days Post-Op s/p laparoscopic cholecystectomy with surgically placed drain for gangrenous cholecystitis, complicated by pertinent comorbidities including HTN, HLD, atrial fibrillation (reported, though no documentation on prior testing), BPH, osteoarthritis, and generalized anxiety disorder.   - regular diet, PO meds   - pain control prn, minimize narcotics   - medical management as per medical and cardiology teams  - follow up cardiology consultation, recommendations appreciated  - okay with therapeutic anticoagulation if advised  - ambulation encouraged   All of  the above findings and recommendations were discussed with the patient, and all of patient's questions were answered to his expressed satisfaction.  -- Scherrie Gerlach Earlene Plater, MD, RPVI :  [facility] General [facility] - Partnering for exceptional care. Office: [phone]

## 2017-02-11 NOTE — Progress Notes (Signed)
@   0600 pts HR noted to be 144. Manual pulse 136. Pt asymptomatic. Vital signs taken and stable (see flowsheets). HR was sustained for about 1 minute in the 130s-140s. MD notified. Due cardizem given as ordered. Will continue to monitor HR.
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
## 2017-02-11 NOTE — Progress Notes (Signed)
[employer] - [place] [facility]   PATIENT NAME: Thomas [name]    MR#:  [id number]  DATE OF BIRTH:  Sep 25, 1945  SUBJECTIVE:  CHIEF COMPLAINT:   Chief Complaint  Patient presents with  . Abdominal Pain  . Emesis   No complaint. HR was up to 136 this am. Controlled now. REVIEW OF SYSTEMS:  Review of Systems  Constitutional: Negative for chills, fever and malaise/fatigue.  HENT: Negative for sore throat.   Eyes: Negative for blurred vision and double vision.  Respiratory: Negative for cough, hemoptysis, shortness of breath, wheezing and stridor.   Cardiovascular: Negative for chest pain, palpitations, orthopnea and leg swelling.  Gastrointestinal: Negative for abdominal pain, blood in stool, diarrhea, melena, nausea and vomiting.  Genitourinary: Negative for dysuria, flank pain and hematuria.  Musculoskeletal: Negative for back pain and joint pain.  Skin: Negative for rash.  Neurological: Negative for dizziness, sensory change, focal weakness, seizures, loss of consciousness, weakness and headaches.  Endo/Heme/Allergies: Negative for polydipsia.  Psychiatric/Behavioral: Negative for depression. The patient is not nervous/anxious.     DRUG ALLERGIES:   Allergies  Allergen Reactions  . Flexeril [Cyclobenzaprine]   . Penicillins Itching and Swelling  . Proscar [Finasteride] Other (See Comments)    dizziness   VITALS:  Blood pressure (!) 110/59, pulse 71, temperature 98.9 F (37.2 C), temperature source Oral, resp. rate (!) 25, height 5' 10.5" (1.791 m), weight 161 lb (73 kg), SpO2 93 %. PHYSICAL EXAMINATION:  Physical Exam  Constitutional: He is oriented to person, place, and time and well-developed, well-nourished, and in no distress.  HENT:  Head: Normocephalic.  Mouth/Throat: Oropharynx is clear and moist.  Eyes: Pupils are equal, round, and reactive to light. Conjunctivae and EOM are normal. No scleral icterus.  Neck: Normal range of motion. Neck  supple. No JVD present. No tracheal deviation present.  Cardiovascular: Normal rate and normal heart sounds.  Exam reveals no gallop.   No murmur heard. Irregular rhythm.  Pulmonary/Chest: Effort normal and breath sounds normal. No respiratory distress. He has no wheezes. He has no rales.  Abdominal: Soft. Bowel sounds are normal. He exhibits no distension. There is no tenderness. There is no rebound.  Musculoskeletal: Normal range of motion. He exhibits no edema or tenderness.  Neurological: He is alert and oriented to person, place, and time. No cranial nerve deficit.  Skin: No rash noted. No erythema.  Psychiatric: Affect normal.   LABORATORY PANEL:  Male CBC  Recent Labs Lab 02/11/17 0338  WBC 8.8  HGB 11.7*  HCT 33.7*  PLT 116*   ------------------------------------------------------------------------------------------------------------------ Chemistries   Recent Labs Lab 02/10/17 0309 02/11/17 0338  NA 135 133*  K 3.8 3.8  CL 101 98*  CO2 29 27  GLUCOSE 136* 106*  BUN 20 23*  CREATININE 0.92 1.00  CALCIUM 8.2* 8.1*  MG 1.6*  --   AST 43* 37  ALT 35 34  ALKPHOS 32* 38  BILITOT 2.6* 2.4*   RADIOLOGY:  No results found. ASSESSMENT AND PLAN:   1. Afib with RVR- continue tele monitor, suspect this is related to postop D/ced tenormin, continue oral Cardizem [place] vasc score 2 , patient would benefit from anticoagulation F/u [facility] cardiology consult.  Hypomagnesemia. Give IV mag.  2. Essential hypotension , Continue Cardizem.  3. Seasonal allergies continue therapy with Flonase and loratadine  4. BPH patient not on any therapy he is on saw palmetto  Acute cholecystitis, s/p lap cholecystectomy.  Elevated bilirubin due to above.  F/u surgeon.  Sign off.  All the records are reviewed and case discussed with Care Management/Social Worker. Management plans discussed with the patient, his wife and they are in agreement.  CODE STATUS: Full Code  TOTAL  TIME TAKING CARE OF THIS PATIENT: 28 minutes.   More than 50% of the time was spent in counseling/coordination of care: YES  POSSIBLE D/C today, DEPENDING ON CLINICAL CONDITION.   Shaune Pollack M.D on 02/11/2017 at 10:04 AM  Between 7am to 6pm - Pager - [phone]  After 6pm go to [email or web address] - Therapist, nutritional Hospitalists

## 2017-02-12 DIAGNOSIS — I480 Paroxysmal atrial fibrillation: Secondary | ICD-10-CM

## 2017-02-12 MED ORDER — ATENOLOL 25 MG PO TABS
25.0000 mg | ORAL_TABLET | Freq: Every day | ORAL | Status: DC
  Filled 2017-02-12: qty 1

## 2017-02-12 MED ORDER — DILTIAZEM HCL ER COATED BEADS 180 MG PO CP24
180.0000 mg | ORAL_CAPSULE | Freq: Every day | ORAL | Status: DC
  Administered 2017-02-12 – 2017-02-14 (×3): 180 mg via ORAL
  Filled 2017-02-12 (×3): qty 1

## 2017-02-12 NOTE — Progress Notes (Signed)
Pts HR noted to be periodically increasing to 144-152bpm every 4-5 minutes with rate returning to baseline (70-85bpm) without intervention. Pt reports feeling occasional palpitations. Denies any other symptoms. BP stable. MD notified. Cardizem given early per MD order.

## 2017-02-12 NOTE — Progress Notes (Addendum)
SURGICAL PROGRESS NOTE  Hospital Day(s): 0.   Post op day(s): 3 Days Post-Op.   Interval History: Patient seen and examined, no new complaints overnight, though patient's HR with atrial fibrillation not consistently rate-controlled. Patient reports occasional palpitations, tolerating diet and ambulation without difficulty, denies abdominal pain, N/V, fever/chills, CP, or SOB.  Review of Systems:  Constitutional: denies fever, chills  HEENT: denies cough or congestion  Respiratory: denies any shortness of breath  Cardiovascular: denies chest pain or palpitations  Gastrointestinal: abdominal pain, N/V, and bowel function as per interval history Genitourinary: denies burning with urination or urinary frequency Musculoskeletal: denies pain, decreased motor or sensation Integumentary: denies any other rashes or skin discolorations except post-surgical abdominal wounds Neurological: denies HA or vision/hearing changes   Vital signs in last 24 hours: [min-max] current  Temp:  [98.8 F (37.1 C)-99.2 F (37.3 C)] 98.8 F (37.1 C) (10/14 0414) Pulse Rate:  [69-75] 75 (10/14 0414) Resp:  [16-18] 18 (10/14 0414) BP: (112-117)/(63-65) 117/63 (10/14 0414) SpO2:  [95 %-97 %] 95 % (10/14 0414)     Height: 5' 10.5" (179.1 cm) Weight: 161 lb (73 kg) BMI (Calculated): 22.77   Intake/Output this shift:  No intake/output data recorded.   Intake/Output last 2 shifts:  @   Physical Exam:  Constitutional: alert, cooperative and no distress  HENT: normocephalic without obvious abnormality  Eyes: PERRL, EOM's grossly intact and symmetric  Neuro: CN II - XII grossly intact and symmetric without deficit  Respiratory: breathing non-labored at rest  Cardiovascular: regular rate and sinus rhythm  Gastrointestinal: soft, non-tender, and non-distended, incisions well-approximated without erythema or drainage, JP drain well-secured with moderate serosanguinous drainage Musculoskeletal: UE  and LE FROM, no edema or wounds, motor and sensation grossly intact, NT   Labs:  CBC Latest Ref Rng & Units 02/11/2017 02/10/2017 02/08/2017  WBC 3.8 - 10.6 K/uL 8.8 10.9(H) 13.8(H)  Hemoglobin 13.0 - 18.0 g/dL 11.7(L) 12.9(L) 15.4  Hematocrit 40.0 - 52.0 % 33.7(L) 36.7(L) 44.5  Platelets 150 - 440 K/uL 116(L) 134(L) 206   CMP Latest Ref Rng & Units 02/11/2017 02/10/2017 02/08/2017  Glucose 65 - 99 mg/dL 161(W) 960(A) 540(J)  BUN 6 - 20 mg/dL 81(X) 20 91(Y)  Creatinine 0.61 - 1.24 mg/dL 7.82 9.56 2.13  Sodium 135 - 145 mmol/L 133(L) 135 135  Potassium 3.5 - 5.1 mmol/L 3.8 3.8 3.3(L)  Chloride 101 - 111 mmol/L 98(L) 101 94(L)  CO2 22 - 32 mmol/L Calcium 8.9 - 10.3 mg/dL 8.1(L) 8.2(L) 9.6  Total Protein 6.5 - 8.1 g/dL 0.8(M) 6.3(L) 8.1  Total Bilirubin 0.3 - 1.2 mg/dL 2.4(H) 2.6(H) 1.4(H)  Alkaline Phos 38 - 126 U/L 38 32(L) 46  AST 15 - 41 U/L 37 43(H) 32  ALT 17 - 63 U/L 34 35 23   Imaging studies: No new pertinent imaging studies   Assessment/Plan: (ICD-10's: K81.2) 71 y.o. male with atrial fibrillation recognized previously and again this admission on POD#1, otherwise doing well 3 Days Post-Op s/p laparoscopic cholecystectomy with surgically placed drain for gangrenous cholecystitis, complicated by pertinent comorbidities including HTN, HLD, atrial fibrillation (reported, though no documentation on prior testing), BPH, osteoarthritis, and generalized anxiety disorder.              - regular diet, PO meds              - pain control prn, minimize narcotics              - medical management  as per medical and cardiology teams             - follow up cardiology consultation, recommendations appreciated  - discharge planning when medically appropriate for discharge             - okay with therapeutic anticoagulation if advised             - ambulation encouraged   All of the above findings and recommendations were discussed with the patient, and all of patient's  questions were answered to his expressed satisfaction.  -- Scherrie Gerlach Earlene Plater, MD, RPVI [place]: [facility] General Surgery - Partnering for exceptional care. Office: [phone]

## 2017-02-12 NOTE — Progress Notes (Addendum)
Progress Note  Patient Name: Thomas [name] Date of Encounter: 02/12/2017  Primary Cardiologist: Formerly Dr Alvino Chapel  Subjective   Feels well today. No further chest pain, shortness of breath, or heart palpitations.  Inpatient Medications    Scheduled Meds: . apixaban  5 mg Oral BID  . atenolol  12.5 mg Oral Daily  . diltiazem  60 mg Oral Q8H  . loratadine  10 mg Oral Daily  . pantoprazole (PROTONIX) IV  40 mg Intravenous QHS   Continuous Infusions: . ciprofloxacin Stopped (02/12/17 0420)   PRN Meds: diphenhydrAMINE **OR** diphenhydrAMINE, fluticasone, hydrALAZINE, HYDROcodone-acetaminophen, ketotifen, metoprolol tartrate, morphine injection, ondansetron **OR** ondansetron (ZOFRAN) IV, prochlorperazine **OR** prochlorperazine   Vital Signs    Vitals:   02/11/17 0602 02/11/17 0603 02/11/17 2049 02/12/17 0414  BP:  (!) 110/59 112/65 117/63  Pulse: (!) 136 71 69 75  Resp:  (!) Temp:  98.9 F (37.2 C) 99.2 F (37.3 C) 98.8 F (37.1 C)  TempSrc:  Oral Oral Oral  SpO2:  93% 97% 95%  Weight:      Height:        Intake/Output Summary (Last 24 hours) at 02/12/17 1400 Last data filed at 02/12/17 1032  Gross per 24 hour  Intake              880 ml  Output             1260 ml  Net             -380 ml   Filed Weights   02/08/17 2059  Weight: 161 lb (73 kg)    Telemetry    Primary rhythm is sinus. However, there continue to be episodes of paroxysmal atrial fibrillation but now with heart rate in the 80s and 90s when in atrial fibrillation. - Personally Reviewed   Physical Exam  Alert, oriented male in no distress GEN: No acute distress.   Neck: No JVD Cardiac: RRR, no murmurs, rubs, or gallops.  Respiratory: Clear to auscultation bilaterally. GI: Soft, nontender, non-distended  MS: No edema; No deformity. Neuro:  Nonfocal  Psych: Normal affect   Labs    Chemistry Recent Labs Lab 02/08/17 2112 02/10/17 0309 02/11/17 0338  NA 135 135 133*  K  3.3* 3.8 3.8  CL 94* 101 98*  CO2 GLUCOSE 144* 136* 106*  BUN 22* 20 23*  CREATININE 1.08 0.92 1.00  CALCIUM 9.6 8.2* 8.1*  PROT 8.1 6.3* 5.8*  ALBUMIN 4.1 2.9* 2.5*  AST 32 43* 37  ALT 23 35 34  ALKPHOS 46 32* 38  BILITOT 1.4* 2.6* 2.4*  GFRNONAA >60 >60 >60  GFRAA >60 >60 >60  ANIONGAP Hematology Recent Labs Lab 02/08/17 2112 02/10/17 0309 02/11/17 0338  WBC 13.8* 10.9* 8.8  RBC 4.77 3.87* 3.56*  HGB 15.4 12.9* 11.7*  HCT 44.5 36.7* 33.7*  MCV 93.2 94.7 94.5  MCH 32.2 33.2 32.9  MCHC 34.6 35.1 34.8  RDW 13.3 13.3 13.0  PLT 206 134* 116*    Cardiac Enzymes Recent Labs Lab 02/08/17 2112  TROPONINI <0.03   No results for input(s): TROPIPOC in the last 168 hours.   BNPNo results for input(s): BNP, PROBNP in the last 168 hours.   DDimer No results for input(s): DDIMER in the last 168 hours.   Radiology    No results found.   Patient Profile     71 y.o. male  with paroxysmal atrial fibrillation following cholecystectomy  Assessment & Plan    Paroxysmal atrial fibrillation: The patient's heart rhythm is improving, now mostly in sinus rhythm. He continues on Cardizem and atenolol. Will consolidate Cardizem to a long-acting preparation with a dose of 180 mg daily. Will increase atenolol to 25 mg daily.  Oral anticoagulation has been started with Eliquis 5 mg twice daily. Will arrange cardiology follow-up at discharge.  For questions or updates, please contact [facility] Please consult [email or web address] for contact info under Cardiology/STEMI.      Enzo Bi, MD  02/12/2017, 2:00 PM         Signed, Tonny Bollman, MD  02/12/2017, 1:45 PM

## 2017-02-13 DIAGNOSIS — Z7951 Long term (current) use of inhaled steroids: Secondary | ICD-10-CM | POA: Diagnosis not present

## 2017-02-13 DIAGNOSIS — N401 Enlarged prostate with lower urinary tract symptoms: Secondary | ICD-10-CM | POA: Diagnosis present

## 2017-02-13 DIAGNOSIS — R1084 Generalized abdominal pain: Secondary | ICD-10-CM | POA: Diagnosis present

## 2017-02-13 DIAGNOSIS — J302 Other seasonal allergic rhinitis: Secondary | ICD-10-CM | POA: Diagnosis present

## 2017-02-13 DIAGNOSIS — Z79899 Other long term (current) drug therapy: Secondary | ICD-10-CM | POA: Diagnosis not present

## 2017-02-13 DIAGNOSIS — I10 Essential (primary) hypertension: Secondary | ICD-10-CM | POA: Diagnosis present

## 2017-02-13 DIAGNOSIS — Z7982 Long term (current) use of aspirin: Secondary | ICD-10-CM | POA: Diagnosis not present

## 2017-02-13 DIAGNOSIS — Z87891 Personal history of nicotine dependence: Secondary | ICD-10-CM | POA: Diagnosis not present

## 2017-02-13 DIAGNOSIS — Z833 Family history of diabetes mellitus: Secondary | ICD-10-CM | POA: Diagnosis not present

## 2017-02-13 DIAGNOSIS — Z23 Encounter for immunization: Secondary | ICD-10-CM | POA: Diagnosis present

## 2017-02-13 DIAGNOSIS — E785 Hyperlipidemia, unspecified: Secondary | ICD-10-CM | POA: Diagnosis present

## 2017-02-13 DIAGNOSIS — E878 Other disorders of electrolyte and fluid balance, not elsewhere classified: Secondary | ICD-10-CM | POA: Diagnosis present

## 2017-02-13 DIAGNOSIS — Z823 Family history of stroke: Secondary | ICD-10-CM | POA: Diagnosis not present

## 2017-02-13 DIAGNOSIS — E876 Hypokalemia: Secondary | ICD-10-CM | POA: Diagnosis present

## 2017-02-13 DIAGNOSIS — I4891 Unspecified atrial fibrillation: Secondary | ICD-10-CM | POA: Diagnosis present

## 2017-02-13 DIAGNOSIS — Z8249 Family history of ischemic heart disease and other diseases of the circulatory system: Secondary | ICD-10-CM | POA: Diagnosis not present

## 2017-02-13 DIAGNOSIS — I493 Ventricular premature depolarization: Secondary | ICD-10-CM | POA: Diagnosis present

## 2017-02-13 DIAGNOSIS — K810 Acute cholecystitis: Secondary | ICD-10-CM | POA: Diagnosis present

## 2017-02-13 DIAGNOSIS — I471 Supraventricular tachycardia: Secondary | ICD-10-CM | POA: Diagnosis present

## 2017-02-13 DIAGNOSIS — I480 Paroxysmal atrial fibrillation: Secondary | ICD-10-CM | POA: Diagnosis present

## 2017-02-13 MED ORDER — POLYETHYLENE GLYCOL 3350 17 G PO PACK
17.0000 g | PACK | Freq: Two times a day (BID) | ORAL | Status: DC
  Administered 2017-02-13 (×2): 17 g via ORAL
  Filled 2017-02-13 (×2): qty 1

## 2017-02-13 MED ORDER — DILTIAZEM HCL 60 MG PO TABS
60.0000 mg | ORAL_TABLET | Freq: Once | ORAL | Status: AC
  Administered 2017-02-13: 60 mg via ORAL
  Filled 2017-02-13: qty 1

## 2017-02-13 MED ORDER — METOPROLOL TARTRATE 5 MG/5ML IV SOLN: 5.0000 mg | Freq: Once | INTRAVENOUS | Status: DC

## 2017-02-13 MED ORDER — FUROSEMIDE 10 MG/ML IJ SOLN
40.0000 mg | Freq: Once | INTRAMUSCULAR | Status: AC
  Administered 2017-02-13: 40 mg via INTRAVENOUS
  Filled 2017-02-13: qty 4

## 2017-02-13 MED ORDER — DOCUSATE SODIUM 100 MG PO CAPS
200.0000 mg | ORAL_CAPSULE | Freq: Two times a day (BID) | ORAL | Status: DC
  Administered 2017-02-13 (×2): 200 mg via ORAL
  Filled 2017-02-13 (×3): qty 2

## 2017-02-13 MED ORDER — ATENOLOL 50 MG PO TABS
50.0000 mg | ORAL_TABLET | Freq: Every day | ORAL | Status: DC
  Administered 2017-02-13: 50 mg via ORAL
  Filled 2017-02-13 (×2): qty 1

## 2017-02-13 MED ORDER — MAGNESIUM SULFATE 2 GM/50ML IV SOLN
2.0000 g | Freq: Once | INTRAVENOUS | Status: AC
  Administered 2017-02-13: 2 g via INTRAVENOUS
  Filled 2017-02-13: qty 50

## 2017-02-13 NOTE — Discharge Instructions (Signed)
Information on my medicine - ELIQUIS® (apixaban) ° °This medication education was reviewed with me or my healthcare representative as part of my discharge preparation.  The pharmacist that spoke with me during my hospital stay was:  Xylah Early, RPH ° °Why was Eliquis® prescribed for you? °Eliquis® was prescribed for you to reduce the risk of forming blood clots that can cause a stroke if you have a medical condition called atrial fibrillation (a type of irregular heartbeat) OR to reduce the risk of a blood clots forming after orthopedic surgery. ° °What do You need to know about Eliquis® ? °Take your Eliquis® TWICE DAILY - one tablet in the morning and one tablet in the evening with or without food.  It would be best to take the doses about the same time each day. ° °If you have difficulty swallowing the tablet whole please discuss with your pharmacist how to take the medication safely. ° °Take Eliquis® exactly as prescribed by your doctor and DO NOT stop taking Eliquis® without talking to the doctor who prescribed the medication.  Stopping may increase your risk of developing a new clot or stroke.  Refill your prescription before you run out. ° °After discharge, you should have regular check-up appointments with your healthcare provider that is prescribing your Eliquis®.  In the future your dose may need to be changed if your kidney function or weight changes by a significant amount or as you get older. ° °What do you do if you miss a dose? °If you miss a dose, take it as soon as you remember on the same day and resume taking twice daily.  Do not take more than one dose of ELIQUIS at the same time. ° °Important Safety Information °A possible side effect of Eliquis® is bleeding. You should call your healthcare provider right away if you experience any of the following: °? Bleeding from an injury or your nose that does not stop. °? Unusual colored urine (red or dark brown) or unusual colored stools (red or  black). °? Unusual bruising for unknown reasons. °? A serious fall or if you hit your head (even if there is no bleeding). ° °Some medicines may interact with Eliquis® and might increase your risk of bleeding or clotting while on Eliquis®. To help avoid this, consult your healthcare provider or pharmacist prior to using any new prescription or non-prescription medications, including herbals, vitamins, non-steroidal anti-inflammatory drugs (NSAIDs) and supplements. ° °This website has more information on Eliquis® (apixaban): [email or web address]. ° ° °

## 2017-02-13 NOTE — Progress Notes (Signed)
Progress Note  Patient Name: Thomas [name] Date of Encounter: 02/13/2017  Primary Cardiologist: Formerly Dr Alvino Chapel  Subjective   The patient feels well although he has not had a bowel movement since Wednesday. He is noted to be in atrial fibrillation with RVR this morning.  Inpatient Medications    Scheduled Meds: . apixaban  5 mg Oral BID  . atenolol  50 mg Oral Daily  . diltiazem  180 mg Oral Daily  . loratadine  10 mg Oral Daily  . pantoprazole (PROTONIX) IV  40 mg Intravenous QHS   Continuous Infusions: . ciprofloxacin Stopped (02/13/17 0420)   PRN Meds: diphenhydrAMINE **OR** diphenhydrAMINE, fluticasone, hydrALAZINE, HYDROcodone-acetaminophen, ketotifen, metoprolol tartrate, morphine injection, ondansetron **OR** ondansetron (ZOFRAN) IV, prochlorperazine **OR** prochlorperazine   Vital Signs    Vitals:   02/13/17 0038 02/13/17 0052 02/13/17 0507 02/13/17 0507  BP: 130/79  126/77   Pulse: 99 (!) 109 (!) 156 84  Resp:   18   Temp:   98.1 F (36.7 C)   TempSrc:   Oral   SpO2: 95%  97% 96%  Weight:      Height:        Intake/Output Summary (Last 24 hours) at 02/13/17 0859 Last data filed at 02/13/17 0600  Gross per 24 hour  Intake             1080 ml  Output              370 ml  Net              710 ml   Filed Weights   02/08/17 2059  Weight: 161 lb (73 kg)    Telemetry    Atrial fibrillation with rapid ventricular response. - Personally Reviewed   Physical Exam  Alert, oriented male in no distress GEN: No acute distress.   Neck: No JVD Cardiac: Irregularly irregular and mildly tachycardic, no murmurs, rubs, or gallops.  Respiratory: Clear to auscultation bilaterally. GI: Soft, nontender, non-distended  MS: No edema; No deformity. Neuro:  Nonfocal  Psych: Normal affect   Labs    Chemistry  Recent Labs Lab 02/08/17 2112 02/10/17 0309 02/11/17 0338  NA 135 135 133*  K 3.3* 3.8 3.8  CL 94* 101 98*  CO2 GLUCOSE 144* 136*  106*  BUN 22* 20 23*  CREATININE 1.08 0.92 1.00  CALCIUM 9.6 8.2* 8.1*  PROT 8.1 6.3* 5.8*  ALBUMIN 4.1 2.9* 2.5*  AST 32 43* 37  ALT 23 35 34  ALKPHOS 46 32* 38  BILITOT 1.4* 2.6* 2.4*  GFRNONAA >60 >60 >60  GFRAA >60 >60 >60  ANIONGAP Hematology  Recent Labs Lab 02/08/17 2112 02/10/17 0309 02/11/17 0338  WBC 13.8* 10.9* 8.8  RBC 4.77 3.87* 3.56*  HGB 15.4 12.9* 11.7*  HCT 44.5 36.7* 33.7*  MCV 93.2 94.7 94.5  MCH 32.2 33.2 32.9  MCHC 34.6 35.1 34.8  RDW 13.3 13.3 13.0  PLT 206 134* 116*    Cardiac Enzymes  Recent Labs Lab 02/08/17 2112  TROPONINI <0.03   No results for input(s): TROPIPOC in the last 168 hours.   BNPNo results for input(s): BNP, PROBNP in the last 168 hours.   DDimer No results for input(s): DDIMER in the last 168 hours.   Radiology    No results found.   Patient Profile     71 y.o. male with paroxysmal atrial fibrillation following cholecystectomy  Assessment &  Plan    1. Paroxysmal atrial fibrillation:  The patient is noted to be in atrial fibrillation with RVR again this morning. He was mostly in sinus rhythm over the weekend. I increased her atenolol to 50 mg once daily. Continue diltiazem and give both this morning. Continue anticoagulation with Eliquis.  2. Status post laparoscopic cholecystectomy: Seems to be doing well overall although he hasn't had a bowel movement since Wednesday.    For questions or updates, please contact [facility] Please consult [email or web address] for contact info under Cardiology/STEMI.      Signed, Lorine Bears, MD  02/13/2017, 8:59 AM

## 2017-02-13 NOTE — Progress Notes (Signed)
Pts HR again noted to be frequently increasing to 140-155bpm and sustaining for 1-2 minutes at a time. Pt reports feeling occasional palpitations. Denies any other symptoms. Md notified. One time dose  cardizem ordered and given.

## 2017-02-13 NOTE — Progress Notes (Signed)
Patient states that he has told "everyone" that he has had a "..heart rate problem for years now...where it goes up and down..."  Central telemetry called to state that patient had a sustained run of 130s and Ventricular trigeminy; patient asymptomatic at this time, HR WNLs at this time; Denies SOB, palpitations or unusual feeling. Windy Carina, RN 11:43 PM 02/13/2017

## 2017-02-13 NOTE — Progress Notes (Signed)
S/p Lap chole Main issues is a fib RVR Cards involved AVS JP 20cc serous   PE NAD Abd: incisions c/d/i, no infection, JP serous  A/p Doing well DC JP DC home once A fib is better controlled

## 2017-02-14 ENCOUNTER — Telehealth: Payer: Self-pay | Admitting: Cardiology

## 2017-02-14 DIAGNOSIS — I471 Supraventricular tachycardia: Secondary | ICD-10-CM

## 2017-02-14 LAB — BASIC METABOLIC PANEL
Anion gap: 10 (ref 5–15)
BUN: 19 mg/dL (ref 6–20)
CO2: 27 mmol/L (ref 22–32)
Calcium: 8.6 mg/dL — ABNORMAL LOW (ref 8.9–10.3)
Chloride: 97 mmol/L — ABNORMAL LOW (ref 101–111)
Creatinine, Ser: 0.89 mg/dL (ref 0.61–1.24)
GFR calc Af Amer: >60 mL/min (ref >60)
GFR calc non Af Amer: >60 mL/min (ref >60)
Glucose, Bld: 107 mg/dL — ABNORMAL HIGH (ref 65–99)
Potassium: 3.7 mmol/L (ref 3.5–5.1)
Sodium: 134 mmol/L — ABNORMAL LOW (ref 135–145)

## 2017-02-14 LAB — CBC
HCT: 36.9 % — ABNORMAL LOW (ref 40.0–52.0)
Hemoglobin: 12.8 g/dL — ABNORMAL LOW (ref 13.0–18.0)
MCH: 32.6 pg (ref 26.0–34.0)
MCHC: 34.8 g/dL (ref 32.0–36.0)
MCV: 93.6 fL (ref 80.0–100.0)
Platelets: 181 10*3/uL (ref 150–440)
RBC: 3.94 MIL/uL — ABNORMAL LOW (ref 4.40–5.90)
RDW: 13.1 % (ref 11.5–14.5)
WBC: 7.5 10*3/uL (ref 3.8–10.6)

## 2017-02-14 LAB — CREATININE, SERUM
Creatinine, Ser: 0.88 mg/dL (ref 0.61–1.24)
GFR calc Af Amer: >60 mL/min (ref >60)
GFR calc non Af Amer: >60 mL/min (ref >60)

## 2017-02-14 LAB — MAGNESIUM: Magnesium: 2.4 mg/dL (ref 1.7–2.4)

## 2017-02-14 LAB — TSH: TSH: 2.759 u[IU]/mL (ref 0.350–4.500)

## 2017-02-14 MED ORDER — POTASSIUM CHLORIDE CRYS ER 20 MEQ PO TBCR
40.0000 meq | EXTENDED_RELEASE_TABLET | Freq: Once | ORAL | Status: AC
  Administered 2017-02-14: 40 meq via ORAL
  Filled 2017-02-14: qty 2

## 2017-02-14 MED ORDER — ATENOLOL 25 MG PO TABS: 75.0000 mg | ORAL_TABLET | Freq: Every day | ORAL | 1 refills | Status: DC

## 2017-02-14 MED ORDER — ATENOLOL 25 MG PO TABS
75.0000 mg | ORAL_TABLET | Freq: Every day | ORAL | Status: DC
  Administered 2017-02-14: 75 mg via ORAL
  Filled 2017-02-14: qty 1

## 2017-02-14 MED ORDER — DILTIAZEM HCL ER COATED BEADS 180 MG PO CP24: 180.0000 mg | ORAL_CAPSULE | Freq: Every day | ORAL | 1 refills | Status: DC

## 2017-02-14 MED ORDER — HYDROCODONE-ACETAMINOPHEN 5-325 MG PO TABS: 1.0000 | ORAL_TABLET | ORAL | 0 refills | Status: DC | PRN

## 2017-02-14 MED ORDER — MAGNESIUM CITRATE PO SOLN
1.0000 | Freq: Once | ORAL | Status: AC
  Administered 2017-02-14: 1 via ORAL
  Filled 2017-02-14: qty 296

## 2017-02-14 MED ORDER — APIXABAN 5 MG PO TABS: 5.0000 mg | ORAL_TABLET | Freq: Two times a day (BID) | ORAL | 1 refills | Status: DC

## 2017-02-14 NOTE — Care Management (Signed)
Patient to discharge with Eliquis.  Eliquis was not listed on his home medications.  Eliquis 30 day trial coupon provided for discharge should patient need it.  RNCM signing off.

## 2017-02-14 NOTE — Progress Notes (Signed)
Pt discharged per MD order. Discharge paperwork reviewed with pt. IV removed Questions answered to pt satisfaction. Prescription given to pt. Pt declined wheelchair and chose to walk to car.

## 2017-02-14 NOTE — Progress Notes (Signed)
Progress Note  Patient Name: Thomas [name] Date of Encounter: 02/14/2017  Primary Cardiologist: Formerly Ingal  Subjective   Feels well this morning. No chest pain or SOB. Still no BM. Having flatus. Has had episodes of atrial tachycardia into the 140s bpm with frequent PVCs, occasionally in the pattern of ventricular trigeminy. Magnesium at goal. HGB stable. Potassium pending.   Inpatient Medications    Scheduled Meds: . apixaban  5 mg Oral BID  . atenolol  75 mg Oral Daily  . diltiazem  180 mg Oral Daily  . docusate sodium  200 mg Oral BID  . loratadine  10 mg Oral Daily  . pantoprazole (PROTONIX) IV  40 mg Intravenous QHS  . polyethylene glycol  17 g Oral BID   Continuous Infusions: . ciprofloxacin Stopped (02/13/17 1712)   PRN Meds: diphenhydrAMINE **OR** diphenhydrAMINE, fluticasone, hydrALAZINE, HYDROcodone-acetaminophen, ketotifen, metoprolol tartrate, morphine injection, ondansetron **OR** ondansetron (ZOFRAN) IV, prochlorperazine **OR** prochlorperazine   Vital Signs    Vitals:   02/13/17 1339 02/13/17 1846 02/13/17 2053 02/14/17 0517  BP: 118/67 109/63 121/73 113/67  Pulse: 86 64 71 70  Resp: Temp: 98.4 F (36.9 C)  98.2 F (36.8 C) 98.8 F (37.1 C)  TempSrc: Oral  Oral Oral  SpO2: 99% 100% 98% 96%  Weight:      Height:        Intake/Output Summary (Last 24 hours) at 02/14/17 0900 Last data filed at 02/14/17 0537  Gross per 24 hour  Intake              480 ml  Output                0 ml  Net              480 ml   Filed Weights   02/08/17 2059  Weight: 161 lb (73 kg)    Telemetry    Currently in sinus rhythm, 70s bpm, occasional atrial tachycardia runs with frequent PVCs in a pattern of ventricular trigeminy - Personally Reviewed  ECG    n/a - Personally Reviewed  Physical Exam   GEN: No acute distress.   Neck: No JVD Cardiac: RRR, no murmurs, rubs, or gallops.  Respiratory: Clear to auscultation bilaterally. GI: Soft,  nontender, non-distended  MS: No edema; No deformity. Neuro:  Nonfocal  Psych: Normal affect   Labs    Chemistry Recent Labs Lab 02/08/17 2112 02/10/17 0309 02/11/17 0338 02/14/17 0303  NA 135 135 133*  --   K 3.3* 3.8 3.8  --   CL 94* 101 98*  --   CO2 --   GLUCOSE 144* 136* 106*  --   BUN 22* 20 23*  --   CREATININE 1.08 0.92 1.00 0.88  CALCIUM 9.6 8.2* 8.1*  --   PROT 8.1 6.3* 5.8*  --   ALBUMIN 4.1 2.9* 2.5*  --   AST 32 43* 37  --   ALT 23 35 34  --   ALKPHOS 46 32* 38  --   BILITOT 1.4* 2.6* 2.4*  --   GFRNONAA >60 >60 >60 >60  GFRAA >60 >60 >60 >60  ANIONGAP --      Hematology Recent Labs Lab 02/10/17 0309 02/11/17 0338 02/14/17 0303  WBC 10.9* 8.8 7.5  RBC 3.87* 3.56* 3.94*  HGB 12.9* 11.7* 12.8*  HCT 36.7* 33.7* 36.9*  MCV 94.7 94.5 93.6  MCH 33.2  32.9 32.6  MCHC 35.1 34.8 34.8  RDW 13.3 13.0 13.1  PLT 134* 116* 181    Cardiac Enzymes Recent Labs Lab 02/08/17 2112  TROPONINI <0.03   No results for input(s): TROPIPOC in the last 168 hours.   BNPNo results for input(s): BNP, PROBNP in the last 168 hours.   DDimer No results for input(s): DDIMER in the last 168 hours.   Radiology    No results found.  Cardiac Studies   n/a  Patient Profile     71 y.o. male paroxysmal atrial fibrillation, atrial tachycardia, and frequent PVCs following cholecystectomy  Assessment & Plan    1. PAF/atrial tachycardia/frequent PVCs: Maintaining sinus rhythm currently. Increase atenolol to 75 mg daily. Continue Cardizem CD 180 mg daily. Check potassium with recommendation to replete to goal of at least 4.0. Magnesium at goal. Check TSH. Continue Eliquis.   2. Status post laparoscopic cholecystectomy: Pain well controlled. Waiting on BM.   For questions or updates, please contact [facility] Please consult [email or web address] for contact info under Cardiology/STEMI.      Signed, Eula Listen, PA-C  02/14/2017, 9:00 AM

## 2017-02-14 NOTE — Care Management Important Message (Signed)
Important Message  Patient Details  Name: Thomas [name] MRN: [id number] Date of Birth: 1945-10-15   Medicare Important Message Given:  Yes    Chapman Fitch, RN 02/14/2017, 12:17 PM

## 2017-02-14 NOTE — Discharge Summary (Signed)
Patient ID: Thomas [name] MRN: [id number] DOB/AGE: 1946-03-20 71 y.o.  Admit date: 02/08/2017 Discharge date: 02/14/2017   Discharge Diagnoses:  Active Problems:   Cholecystitis   Paroxysmal atrial fibrillation (HCC)   New onset a-fib [facility])   Procedures:lap chole  Hospital Course: 71 year old male admitted for cholecystitis and taken to the operating room for laparoscopic cholecystectomy with drain placement by Dr. Excell Seltzer. HE did have gangrenous cholecystitis and required a drain. He did very well postoperatively except he did go into A. fib with RVR. Cardiology service consulted and started on beta blocker as well as a calcium channel blocker. His rate was better controlled and he was restarted on anticoagulation. At time of discharge she was ambulating, tolerating regular diet. His bowel sounds were stable he is afebrile. His physical exam showed a male distress. Awake alert. Abdomen: Soft nontender incisions healing well without evidence of infection. We did remove the drain before discharge. Ext: no edema. Condition of patient at discharge is  stable  Consults: Cardiology   Disposition: 01-Home or Self Care  Discharge Instructions    Call MD for:  difficulty breathing, headache or visual disturbances    Complete by:  As directed    Call MD for:  extreme fatigue    Complete by:  As directed    Call MD for:  hives    Complete by:  As directed    Call MD for:  persistant dizziness or light-headedness    Complete by:  As directed    Call MD for:  persistant nausea and vomiting    Complete by:  As directed    Call MD for:  redness, tenderness, or signs of infection (pain, swelling, redness, odor or green/yellow discharge around incision site)    Complete by:  As directed    Call MD for:  severe uncontrolled pain    Complete by:  As directed    Call MD for:  temperature >100.4    Complete by:  As directed    Diet - low sodium heart healthy    Complete by:  As directed    Discharge instructions    Complete by:  As directed    Shower daily   Increase activity slowly    Complete by:  As directed    Lifting restrictions    Complete by:  As directed    20 lbs x 6 wks     Allergies as of 02/14/2017      Reactions   Flexeril [cyclobenzaprine]    Penicillins Itching, Swelling   Proscar [finasteride] Other (See Comments)   dizziness      Medication List    TAKE these medications   apixaban 5 MG Tabs tablet Commonly known as:  ELIQUIS Take 1 tablet (5 mg total) by mouth 2 (two) times daily.   aspirin EC 81 MG tablet Take 81 mg by mouth daily.   atenolol 25 MG tablet Commonly known as:  TENORMIN Take 12.5 mg by mouth daily. What changed:  Another medication with the same name was added. Make sure you understand how and when to take each.   atenolol 25 MG tablet Commonly known as:  TENORMIN Take 3 tablets (75 mg total) by mouth daily. What changed:  You were already taking a medication with the same name, and this prescription was added. Make sure you understand how and when to take each.   atorvastatin 20 MG tablet Commonly known as:  LIPITOR Take 1 tablet (20 mg total) by mouth  at bedtime.   Cholecalciferol 1000 units capsule Take 1,000 Units by mouth daily.   diltiazem 180 MG 24 hr capsule Commonly known as:  CARDIZEM CD Take 1 capsule (180 mg total) by mouth daily.   fluticasone 50 MCG/ACT nasal spray Commonly known as:  FLONASE Place 2 sprays into both nostrils daily.   HYDROcodone-acetaminophen 5-325 MG tablet Commonly known as:  NORCO/VICODIN Take 1-2 tablets by mouth every 4 (four) hours as needed for moderate pain.   ketotifen 0.025 % ophthalmic solution Commonly known as:  ZADITOR Place 1 drop into both eyes 2 (two) times daily.   loratadine 10 MG tablet Commonly known as:  CLARITIN Take 10 mg by mouth daily.   meloxicam 15 MG tablet Commonly known as:  MOBIC Take 1 tablet (15 mg total) by mouth daily as needed for  pain. Take your aspirin at least one hour BEFORE this med   multivitamin capsule Take 1 capsule by mouth daily.   RA FLAX SEED OIL 1000 PO Take 1 tablet by mouth daily.   RED YEAST RICE PO Take 800 mg by mouth daily.   Saw Palmetto 450 MG Caps Take 3 capsules by mouth daily.   traMADol 50 MG tablet Commonly known as:  ULTRAM Take 50 mg by mouth 2 (two) times daily as needed.      Follow-up Information    [facility]. Go on 02/23/2017.   Why:  Thursday At 2:45pm with Dr Excell Seltzer Please Arrive early  Contact information: [address] [place] [address] [phone]       Tonny Bollman, MD Follow up in 1 week(s).   Specialty:  Cardiology Contact information: [address] [place] [address] [phone]            Sterling Big, MD FACS

## 2017-02-16 NOTE — Telephone Encounter (Signed)
[facility].  Patient understands to follow up with provider Dr. Mariah MillingGollan on 03/17/17 at 09:00 [facility]. Patient understands discharge instructions? Yes Patient understands medications and regiment? Yes Patient understands to bring all medications to this visit? Yes  Spoke with patient and instructed him to monitor blood pressures and keep a log to bring in to his next visit. He verbalized understanding of our conversation, agreement with plan, and had no further questions at this time.

## 2017-02-16 NOTE — Telephone Encounter (Signed)
TCM ph [facility] for new onset afib  Needs 1 week fu  Scheduled 11/16 Gollan  Added to waitlist

## 2017-02-22 ENCOUNTER — Telehealth: Payer: Self-pay

## 2017-02-22 NOTE — Telephone Encounter (Signed)
Attempted to schedule from waitlist  No ans no vm

## 2017-02-23 ENCOUNTER — Telehealth: Payer: Self-pay | Admitting: Cardiovascular Disease

## 2017-02-23 ENCOUNTER — Encounter: Payer: Self-pay | Admitting: Surgery

## 2017-02-23 ENCOUNTER — Ambulatory Visit (INDEPENDENT_AMBULATORY_CARE_PROVIDER_SITE_OTHER): Payer: Medicare HMO | Admitting: Surgery

## 2017-02-23 VITALS — BP 159/79 | HR 73 | Temp 98.0°F | Resp 14 | Ht 71.0 in | Wt 164.4 lb

## 2017-02-23 DIAGNOSIS — K8000 Calculus of gallbladder with acute cholecystitis without obstruction: Secondary | ICD-10-CM

## 2017-02-23 NOTE — Patient Instructions (Signed)
Check in with your Cardiologist about the symptoms that you are having with your new medications.  You are healing well from surgery!  GENERAL POST-OPERATIVE PATIENT INSTRUCTIONS   WOUND CARE INSTRUCTIONS:  Keep a dry clean dressing on the wound if there is drainage. The initial bandage may be removed after 24 hours.  Once the wound has quit draining you may leave it open to air.  If clothing rubs against the wound or causes irritation and the wound is not draining you may cover it with a dry dressing during the daytime.  Try to keep the wound dry and avoid ointments on the wound unless directed to do so.  If the wound becomes bright red and painful or starts to drain infected material that is not clear, please contact your physician immediately.  If the wound is mildly pink and has a thick firm ridge underneath it, this is normal, and is referred to as a healing ridge.  This will resolve over the next 4-6 weeks.  BATHING: You may shower if you have been informed of this by your surgeon. However, Please do not submerge in a tub, hot tub, or pool until incisions are completely sealed or have been told by your surgeon that you may do so.  DIET:  You may eat any foods that you can tolerate.  It is a good idea to eat a high fiber diet and take in plenty of fluids to prevent constipation.  If you do become constipated you may want to take a mild laxative or take ducolax tablets on a daily basis until your bowel habits are regular.  Constipation can be very uncomfortable, along with straining, after recent surgery.  ACTIVITY:  You are encouraged to cough and deep breath or use your incentive spirometer if you were given one, every 15-30 minutes when awake.  This will help prevent respiratory complications and low grade fevers post-operatively if you had a general anesthetic.  You may want to hug a pillow when coughing and sneezing to add additional support to the surgical area, if you had abdominal or chest  surgery, which will decrease pain during these times.  You are encouraged to walk and engage in light activity for the next two weeks.  You should not lift more than 20 pounds, until 03/23/2017 as it could put you at increased risk for complications.  Twenty pounds is roughly equivalent to a plastic bag of groceries. At that time- Listen to your body when lifting, if you have pain when lifting, stop and then try again in a few days. Soreness after doing exercises or activities of daily living is normal as you get back in to your normal routine.  MEDICATIONS:  Try to take narcotic medications and anti-inflammatory medications, such as tylenol, ibuprofen, naprosyn, etc., with food.  This will minimize stomach upset from the medication.  Should you develop nausea and vomiting from the pain medication, or develop a rash, please discontinue the medication and contact your physician.  You should not drive, make important decisions, or operate machinery when taking narcotic pain medication.  SUNBLOCK Use sun block to incision area over the next year if this area will be exposed to sun. This helps decrease scarring and will allow you avoid a permanent darkened area over your incision.  QUESTIONS:  Please feel free to call our office if you have any questions, and we will be glad to assist you. [phone]

## 2017-02-23 NOTE — Progress Notes (Signed)
Outpatient postop visit  02/23/2017  Thomas [name] is an 71 y.o. male.    Procedure: Laparoscopic cholecystectomy  CC: Poor taste  HPI: Status post laparoscopic cholecystectomy.  Patient feels better.  He is gaining his strength.  Patient is eating well but states he does not have much taste.  Otherwise feeling well no fevers chills and no abdominal pain  Medications reviewed.    Physical Exam:  Ht 5\' 11"  (1.803 m)     PE: No fevers no icterus no jaundice abdomen is soft nontender wounds are clean no erythema no drainage.    Assessment/Plan:  Acute cholecystitis with necrosis.  No malignancy on pathology. Patient doing very well.  He is having some trouble with his cardiac medications and will stop by to see the cardiologist today.  Follow-up on an as-needed basis. Lattie Hawichard E Cooper, MD, FACS

## 2017-02-23 NOTE — Telephone Encounter (Signed)
PT just saw Dr Excell Seltzerooper and came by to discuss medication issue   1. Name of Medication: Eliquis, Ditiazem and atenolol  2. How are you currently taking this medication (dosage and times per day)? Daily   3. Are you having a reaction (difficulty breathing--STAT)? Wakes up in sweat, has no energy, has been lightheaded and has a hard time getting up out of chair  4. What is your medication issue? PT was seen in hospital and was recently added to Eliquis, Ditiazem and then his Atenolol was increased from 25mg  to 75 mg.  Ever since he has been experiencing these side effects.   Please call to advise. PT has appt with Dr Mariah MillingGollan on 11/16

## 2017-02-23 NOTE — Telephone Encounter (Signed)
Spoke with patient and he states that since starting on the Diltiazem he has been feeling poorly. He doesn't feel well, dizziness at times, breaks out in a sweat during the night multiple times, and states that he just doesn't like feeling this way. Advised him to start taking it at supper time and see if that will help some with his symptoms and I will route message to physician for review and further recommendations. He verbalized understanding and had no further questions at this time.

## 2017-02-26 NOTE — Telephone Encounter (Signed)
I have never met him He needs an appt with any provider Can he measure BP and heart rate at home

## 2017-02-27 ENCOUNTER — Encounter: Payer: Self-pay | Admitting: Cardiovascular Disease

## 2017-02-27 ENCOUNTER — Ambulatory Visit (INDEPENDENT_AMBULATORY_CARE_PROVIDER_SITE_OTHER): Payer: Medicare HMO | Admitting: Cardiovascular Disease

## 2017-02-27 VITALS — BP 118/72 | HR 72 | Ht 69.5 in | Wt 164.5 lb

## 2017-02-27 DIAGNOSIS — E782 Mixed hyperlipidemia: Secondary | ICD-10-CM

## 2017-02-27 DIAGNOSIS — F419 Anxiety disorder, unspecified: Secondary | ICD-10-CM

## 2017-02-27 DIAGNOSIS — K819 Cholecystitis, unspecified: Secondary | ICD-10-CM

## 2017-02-27 DIAGNOSIS — I10 Essential (primary) hypertension: Secondary | ICD-10-CM

## 2017-02-27 DIAGNOSIS — I480 Paroxysmal atrial fibrillation: Secondary | ICD-10-CM
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
## 2017-02-27 NOTE — Telephone Encounter (Signed)
Dr. Mariah MillingGollan has opening today at 4:20pm. Called patient and he is able to come in at that time today.

## 2017-02-27 NOTE — Patient Instructions (Signed)
Medication Instructions:   Stop the diltiazem Stay on atenolol Monitor BP and heart rate  Labwork:  No new labs needed  Testing/Procedures:  No further testing at this time   Follow-Up: It was a pleasure seeing you in the office today. Please call us if you have new issues that need to be addressed before your next appt.  [phone]  Your physician wants you to follow-up in: 6 months.  You will receive a reminder letter in the mail two months in advance. If you don't receive a letter, please call our office to schedule the follow-up appointment.  If you need a refill on your cardiac medications before your next appointment, please call your pharmacy.

## 2017-03-17 ENCOUNTER — Ambulatory Visit: Payer: Medicare HMO | Admitting: Cardiovascular Disease

## 2017-03-29 ENCOUNTER — Other Ambulatory Visit: Payer: Self-pay

## 2017-03-29 MED ORDER — APIXABAN 5 MG PO TABS: 5.0000 mg | ORAL_TABLET | Freq: Two times a day (BID) | ORAL | 1 refills | Status: DC

## 2017-03-29 NOTE — Telephone Encounter (Signed)
Please review for refill, thanks ! 

## 2017-05-25 ENCOUNTER — Encounter: Payer: Self-pay | Admitting: Family Medicine

## 2017-05-25 ENCOUNTER — Other Ambulatory Visit: Payer: Self-pay | Admitting: *Deleted

## 2017-05-25 ENCOUNTER — Ambulatory Visit (INDEPENDENT_AMBULATORY_CARE_PROVIDER_SITE_OTHER): Payer: Medicare HMO | Admitting: Family Medicine

## 2017-05-25 VITALS — BP 132/64 | HR 63 | Temp 98.1°F | Ht 69.5 in | Wt 170.1 lb

## 2017-05-25 DIAGNOSIS — R6883 Chills (without fever): Secondary | ICD-10-CM

## 2017-05-25 DIAGNOSIS — B349 Viral infection, unspecified: Secondary | ICD-10-CM

## 2017-05-25 LAB — POCT INFLUENZA A/B
Influenza A, POC: NEGATIVE
Influenza B, POC: NEGATIVE

## 2017-05-25 MED ORDER — ATENOLOL 25 MG PO TABS: 75.0000 mg | ORAL_TABLET | Freq: Every day | ORAL | 1 refills | Status: DC
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
## 2017-05-25 NOTE — Patient Instructions (Addendum)
You are contagious and please avoid crowds, public eating areas, nursing homes, daycares, etc. Try vitamin C (orange juice if not diabetic or vitamin C tablets) and drink green tea to help your immune system during your illness Get plenty of rest and hydration  Viral Illness, Adult Viruses are tiny germs that can get into a person's body and cause illness. There are many different types of viruses, and they cause many types of illness. Viral illnesses can range from mild to severe. They can affect various parts of the body. Common illnesses that are caused by a virus include colds and the flu. Viral illnesses also include serious conditions such as HIV/AIDS (human immunodeficiency virus/acquired immunodeficiency syndrome). A few viruses have been linked to certain cancers. What are the causes? Many types of viruses can cause illness. Viruses invade cells in your body, multiply, and cause the infected cells to malfunction or die. When the cell dies, it releases more of the virus. When this happens, you develop symptoms of the illness, and the virus continues to spread to other cells. If the virus takes over the function of the cell, it can cause the cell to divide and grow out of control, as is the case when a virus causes cancer. Different viruses get into the body in different ways. You can get a virus by:  Swallowing food or water that is contaminated with the virus.  Breathing in droplets that have been coughed or sneezed into the air by an infected person.  Touching a surface that has been contaminated with the virus and then touching your eyes, nose, or mouth.  Being bitten by an insect or animal that carries the virus.  Having sexual contact with a person who is infected with the virus.  Being exposed to blood or fluids that contain the virus, either through an open cut or during a transfusion.  If a virus enters your body, your body's defense system (immune system) will try to fight the  virus. You may be at higher risk for a viral illness if your immune system is weak. What are the signs or symptoms? Symptoms vary depending on the type of virus and the location of the cells that it invades. Common symptoms of the main types of viral illnesses include: Cold and flu viruses  Fever.  Headache.  Sore throat.  Muscle aches.  Nasal congestion.  Cough. Digestive system (gastrointestinal) viruses  Fever.  Abdominal pain.  Nausea.  Diarrhea. Liver viruses (hepatitis)  Loss of appetite.  Tiredness.  Yellowing of the skin (jaundice). Brain and spinal cord viruses  Fever.  Headache.  Stiff neck.  Nausea and vomiting.  Confusion or sleepiness. Skin viruses  Warts.  Itching.  Rash. Sexually transmitted viruses  Discharge.  Swelling.  Redness.  Rash. How is this treated? Viruses can be difficult to treat because they live within cells. Antibiotic medicines do not treat viruses because these drugs do not get inside cells. Treatment for a viral illness may include:  Resting and drinking plenty of fluids.  Medicines to relieve symptoms. These can include over-the-counter medicine for pain and fever, medicines for cough or congestion, and medicines to relieve diarrhea.  Antiviral medicines. These drugs are available only for certain types of viruses. They may help reduce flu symptoms if taken early. There are also many antiviral medicines for hepatitis and HIV/AIDS.  Some viral illnesses can be prevented with vaccinations. A common example is the flu shot. Follow these instructions at home: Medicines   Take  over-the-counter and prescription medicines only as told by your health care provider.  If you were prescribed an antiviral medicine, take it as told by your health care provider. Do not stop taking the medicine even if you start to feel better.  Be aware of when antibiotics are needed and when they are not needed. Antibiotics do not  treat viruses. If your health care provider thinks that you may have a bacterial infection as well as a viral infection, you may get an antibiotic. ? Do not ask for an antibiotic prescription if you have been diagnosed with a viral illness. That will not make your illness go away faster. ? Frequently taking antibiotics when they are not needed can lead to antibiotic resistance. When this develops, the medicine no longer works against the bacteria that it normally fights. General instructions  Drink enough fluids to keep your urine clear or pale yellow.  Rest as much as possible.  Return to your normal activities as told by your health care provider. Ask your health care provider what activities are safe for you.  Keep all follow-up visits as told by your health care provider. This is important. How is this prevented? Take these actions to reduce your risk of viral infection:  Eat a healthy diet and get enough rest.  Wash your hands often with soap and water. This is especially important when you are in public places. If soap and water are not available, use hand sanitizer.  Avoid close contact with friends and family who have a viral illness.  If you travel to areas where viral gastrointestinal infection is common, avoid drinking water or eating raw food.  Keep your immunizations up to date. Get a flu shot every year as told by your health care provider.  Do not share toothbrushes, nail clippers, razors, or needles with other people.  Always practice safe sex.  Contact a health care provider if:  You have symptoms of a viral illness that do not go away.  Your symptoms come back after going away.  Your symptoms get worse. Get help right away if:  You have trouble breathing.  You have a severe headache or a stiff neck.  You have severe vomiting or abdominal pain. This information is not intended to replace advice given to you by your health care provider. Make sure you  discuss any questions you have with your health care provider. Document Released: 08/28/2015 Document Revised: 09/30/2015 Document Reviewed: 08/28/2015 Elsevier Interactive Patient Education  [employer]2018 [employer]

## 2017-06-02 ENCOUNTER — Ambulatory Visit: Payer: Medicare HMO | Admitting: Family Medicine

## 2017-06-09 ENCOUNTER — Ambulatory Visit (INDEPENDENT_AMBULATORY_CARE_PROVIDER_SITE_OTHER): Payer: Medicare HMO | Admitting: Family Medicine

## 2017-06-09 ENCOUNTER — Encounter: Payer: Self-pay | Admitting: Family Medicine

## 2017-06-09 DIAGNOSIS — N400 Enlarged prostate without lower urinary tract symptoms: Secondary | ICD-10-CM

## 2017-06-09 DIAGNOSIS — I10 Essential (primary) hypertension: Secondary | ICD-10-CM

## 2017-06-09 DIAGNOSIS — E782 Mixed hyperlipidemia: Secondary | ICD-10-CM | POA: Diagnosis not present

## 2017-06-09 DIAGNOSIS — I480 Paroxysmal atrial fibrillation: Secondary | ICD-10-CM

## 2017-06-09 DIAGNOSIS — H1010 Acute atopic conjunctivitis, unspecified eye: Secondary | ICD-10-CM | POA: Insufficient documentation

## 2017-06-09 DIAGNOSIS — R739 Hyperglycemia, unspecified: Secondary | ICD-10-CM

## 2017-06-09 DIAGNOSIS — J309 Allergic rhinitis, unspecified: Secondary | ICD-10-CM | POA: Diagnosis not present

## 2017-06-09 DIAGNOSIS — H1013 Acute atopic conjunctivitis, bilateral: Secondary | ICD-10-CM | POA: Diagnosis not present

## 2017-06-09 DIAGNOSIS — Z5181 Encounter for therapeutic drug level monitoring: Secondary | ICD-10-CM

## 2017-06-09 MED ORDER — MONTELUKAST SODIUM 10 MG PO TABS: 10.0000 mg | ORAL_TABLET | Freq: Every day | ORAL | 11 refills | Status: DC

## 2017-06-09 NOTE — Assessment & Plan Note (Signed)
Controlled today; limit salt 

## 2017-06-09 NOTE — Assessment & Plan Note (Signed)
Managed by Dr. Achilles Dunkope yearly

## 2017-06-09 NOTE — Assessment & Plan Note (Signed)
Check liver and kidneys 

## 2017-06-09 NOTE — Assessment & Plan Note (Signed)
Will add singulair to other meds; continue avoid triggers

## 2017-06-09 NOTE — Assessment & Plan Note (Signed)
Managed by cardiologist 

## 2017-06-09 NOTE — Assessment & Plan Note (Signed)
Check A1c. 

## 2017-06-09 NOTE — Patient Instructions (Addendum)
Check with the pharmacist about the atenolol prescription from Dr. Mariah MillingGollan sent in on May 25, 2017 Start the new medicine for allergies and continue the others We'll get labs If you have not heard anything from my staff in a week about any orders/referrals/studies from today, please contact us here to follow-up [phone]

## 2017-06-09 NOTE — Assessment & Plan Note (Signed)
Check lipids, nonfasting; limiting saturated fats, increase whole grains, fiber
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
## 2017-06-10 LAB — CBC WITH DIFFERENTIAL/PLATELET
Basophils Absolute: 39 cells/uL (ref 0–200)
Basophils Relative: 0.7 %
Eosinophils Absolute: 369 cells/uL (ref 15–500)
Eosinophils Relative: 6.7 %
HCT: 38.9 % (ref 38.5–50.0)
Hemoglobin: 13.5 g/dL (ref 13.2–17.1)
Lymphs Abs: 1876 cells/uL (ref 850–3900)
MCH: 31.8 pg (ref 27.0–33.0)
MCHC: 34.7 g/dL (ref 32.0–36.0)
MCV: 91.5 fL (ref 80.0–100.0)
MPV: 10.5 fL (ref 7.5–12.5)
Monocytes Relative: 8.3 %
Neutro Abs: 2761 cells/uL (ref 1500–7800)
Neutrophils Relative %: 50.2 %
Platelets: 189 10*3/uL (ref 140–400)
RBC: 4.25 10*6/uL (ref 4.20–5.80)
RDW: 12.6 % (ref 11.0–15.0)
Total Lymphocyte: 34.1 %
WBC mixed population: 457 cells/uL (ref 200–950)
WBC: 5.5 10*3/uL (ref 3.8–10.8)

## 2017-06-10 LAB — LIPID PANEL
Cholesterol: 114 mg/dL (ref <200)
HDL: 40 mg/dL — ABNORMAL LOW (ref >40)
LDL Cholesterol (Calc): 57 mg/dL (calc)
Non-HDL Cholesterol (Calc): 74 mg/dL (calc) (ref <130)
Total CHOL/HDL Ratio: 2.9 (calc) (ref <5.0)
Triglycerides: 83 mg/dL (ref <150)

## 2017-06-10 LAB — COMPREHENSIVE METABOLIC PANEL
AG Ratio: 1.2 (calc) (ref 1.0–2.5)
ALT: 20 U/L (ref 9–46)
AST: 23 U/L (ref 10–35)
Albumin: 3.7 g/dL (ref 3.6–5.1)
Alkaline phosphatase (APISO): 47 U/L (ref 40–115)
BUN: 22 mg/dL (ref 7–25)
CO2: 27 mmol/L (ref 20–32)
Calcium: 9.3 mg/dL (ref 8.6–10.3)
Chloride: 98 mmol/L (ref 98–110)
Creat: 0.9 mg/dL (ref 0.70–1.18)
Globulin: 3 g/dL (calc) (ref 1.9–3.7)
Glucose, Bld: 100 mg/dL (ref 65–139)
Potassium: 4.1 mmol/L (ref 3.5–5.3)
Sodium: 134 mmol/L — ABNORMAL LOW (ref 135–146)
Total Bilirubin: 0.7 mg/dL (ref 0.2–1.2)
Total Protein: 6.7 g/dL (ref 6.1–8.1)

## 2017-06-10 LAB — HEMOGLOBIN A1C
Hgb A1c MFr Bld: 5.7 % of total Hgb — ABNORMAL HIGH (ref <5.7)
Mean Plasma Glucose: 117 (calc)
eAG (mmol/L): 6.5 (calc)

## 2017-06-28 ENCOUNTER — Other Ambulatory Visit: Payer: Self-pay | Admitting: Family Medicine

## 2017-06-29 ENCOUNTER — Other Ambulatory Visit: Payer: Self-pay | Admitting: Family Medicine

## 2017-06-29 NOTE — Progress Notes (Signed)
Removing meloxicam from med list

## 2017-06-29 NOTE — Telephone Encounter (Signed)
Cr and K+ Feb 2019 reviewed However, patient on eliquis Rx denied Thomas MuirJamie, please let patient know: I'd rather he not take the meloxicam any more Recommend plain tylenol, topicals (like Biofreeze), and turmeric please Thank you

## 2017-06-29 NOTE — Telephone Encounter (Signed)
pt notified and verbalized understanding.

## 2017-07-19 ENCOUNTER — Other Ambulatory Visit: Payer: Self-pay | Admitting: Cardiovascular Disease

## 2017-07-19 NOTE — Telephone Encounter (Signed)
Please review for refill, Thanks !  

## 2017-10-16 ENCOUNTER — Other Ambulatory Visit: Payer: Self-pay | Admitting: Cardiovascular Disease

## 2017-10-16 NOTE — Telephone Encounter (Signed)
Please review for refill, Thanks !  

## 2017-11-29 IMAGING — CT CT ABD-PELV W/ CM
2 of 5 series · 15 of 46 positions shown, 17 images · IV contrast (APPLIED)
Comparison: CT of the abdomen and pelvis performed 08/26/2010

CLINICAL DATA: Acute onset of generalized abdominal pain, nausea
and vomiting. Initial encounter.

EXAM:
CT ABDOMEN AND PELVIS WITH CONTRAST
TECHNIQUE: Multidetector CT imaging of the abdomen and pelvis was performed
using the standard protocol following bolus administration of
intravenous contrast.
CONTRAST:  100mL L221HC-ERR IOPAMIDOL (L221HC-ERR) INJECTION 61%

[Series 2: routine abd/pel with · axial · 0.85mm/px · z∈[-1065,-635]mm · 12 of 98 slices shown, 14 images]
[im 6/98  soft-tissue]
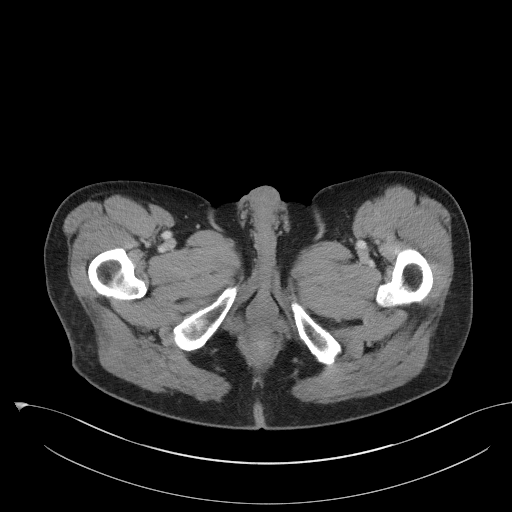
[im 6/98  bone]
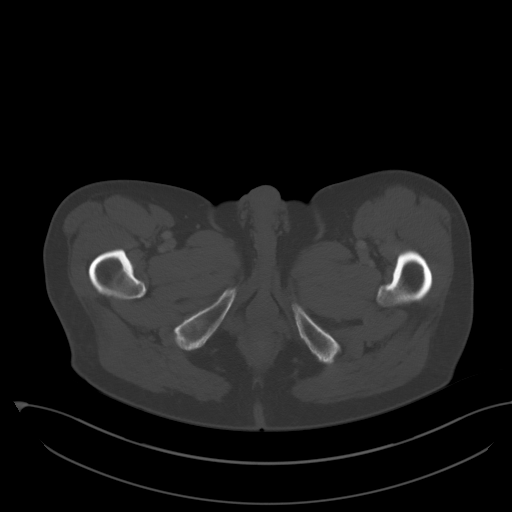
[im 16/98  soft-tissue]
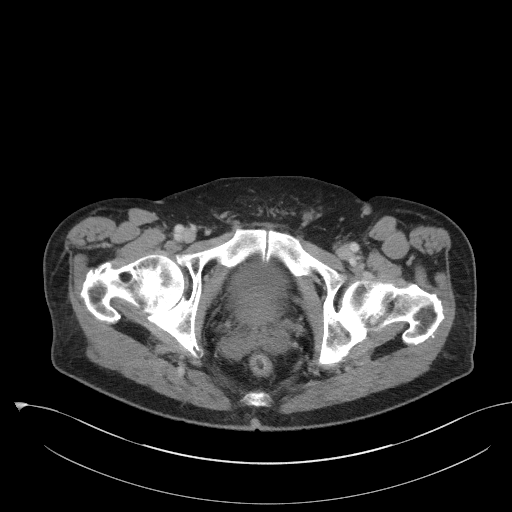
[im 21/98  soft-tissue]
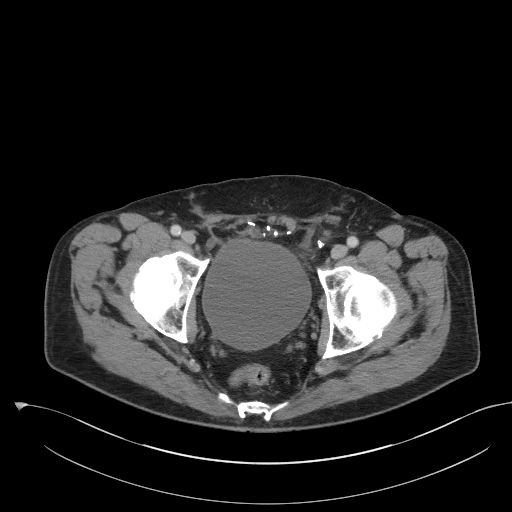
[im 31/98  soft-tissue]
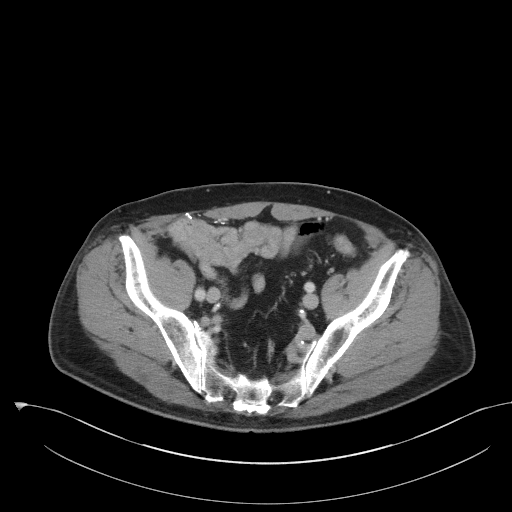
[im 36/98  soft-tissue]
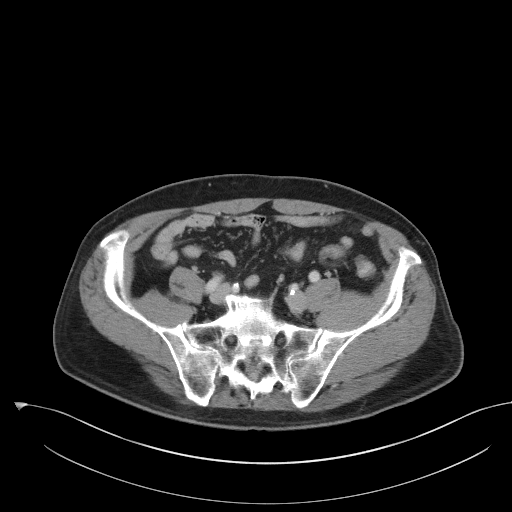
[im 46/98  soft-tissue]
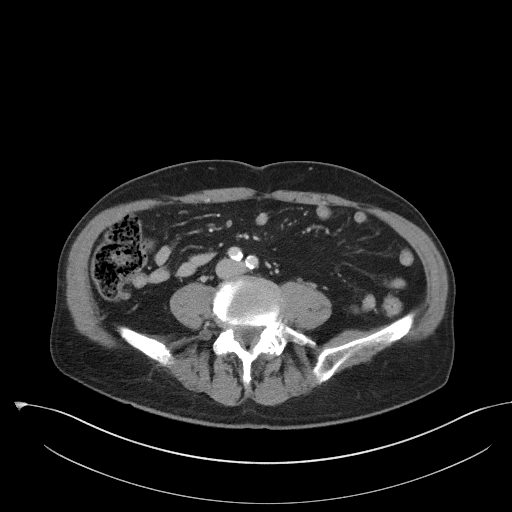
[im 52/98  soft-tissue]
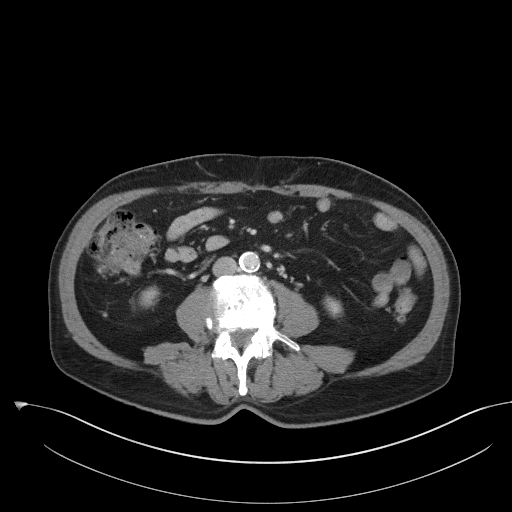
[im 62/98  soft-tissue]
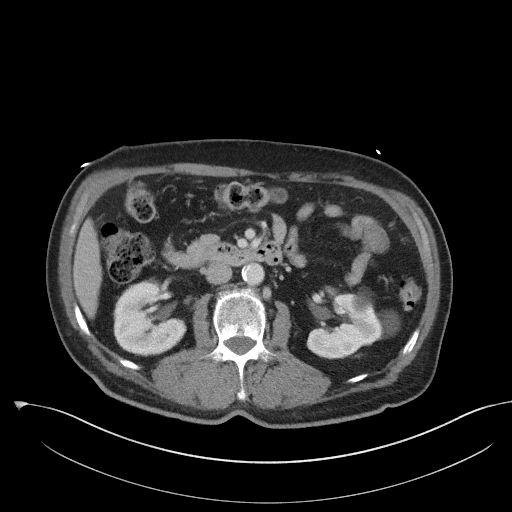
[im 67/98  soft-tissue]
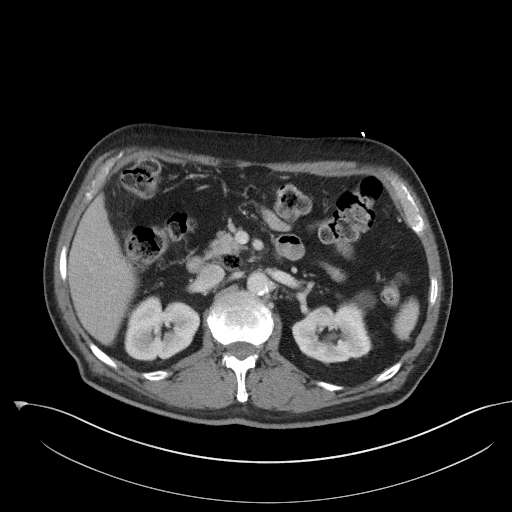
[im 67/98  bone]
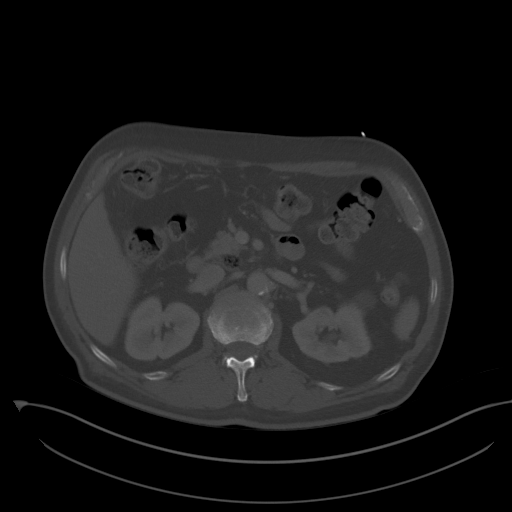
[im 77/98  soft-tissue]
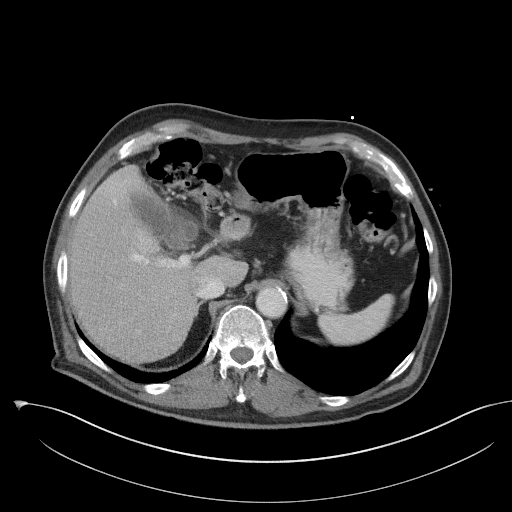
[im 82/98  soft-tissue]
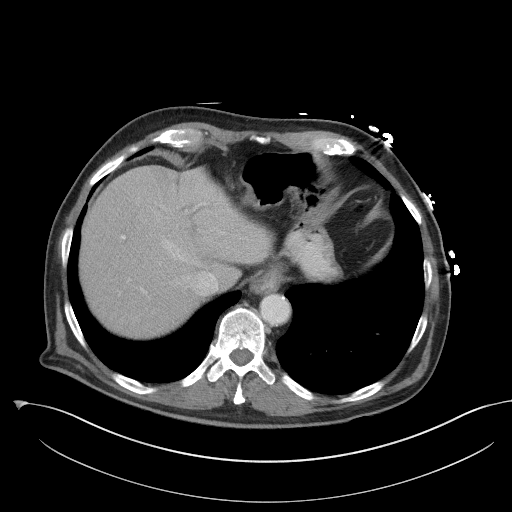
[im 92/98  soft-tissue]
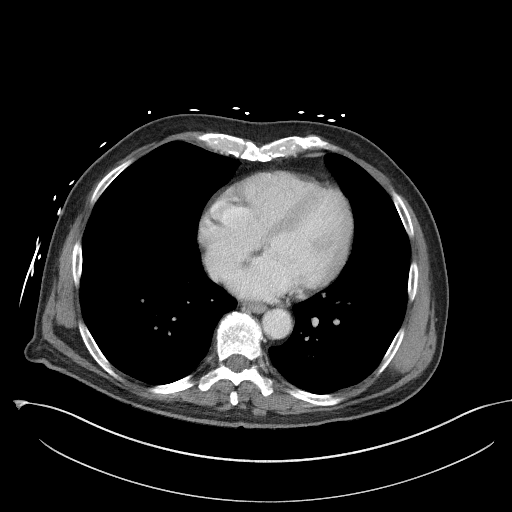

[Series 5: coronal st · coronal · 0.68mm/px · 3 of 91 slices shown]
[im 31/91  soft-tissue]
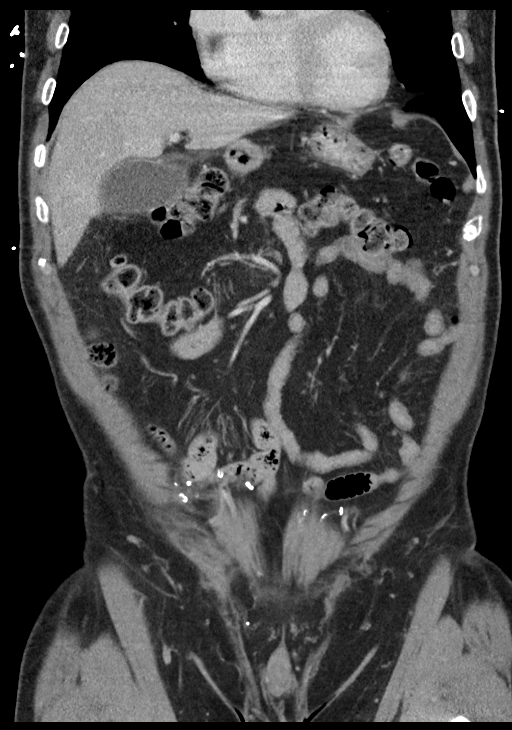
[im 41/91  soft-tissue]
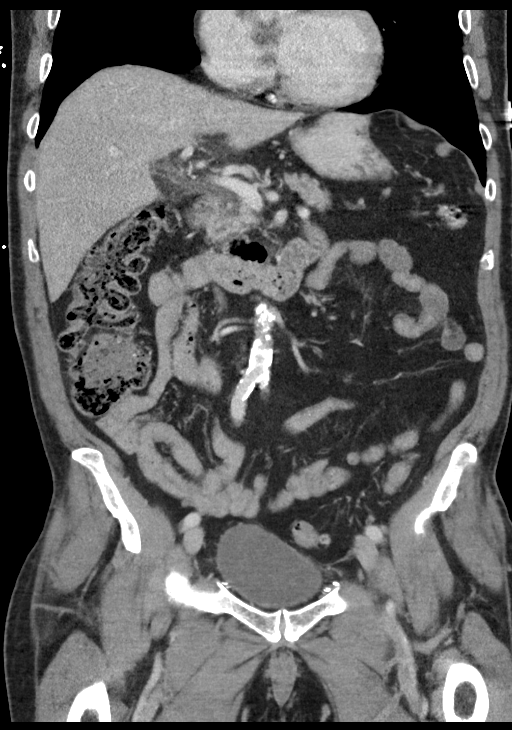
[im 51/91  soft-tissue]
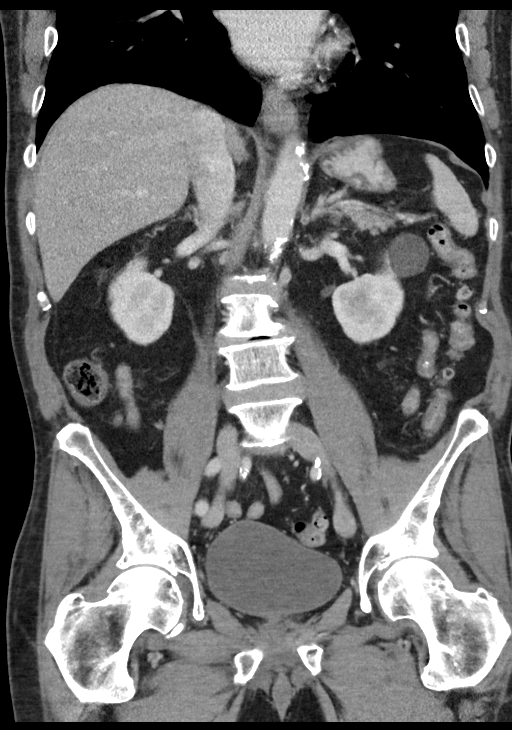

[15 of 46 positions shown; findings below may reference images not displayed]

FINDINGS: Lower chest: Scattered coronary artery calcifications are seen. The
visualized lung bases are clear.

Hepatobiliary: The liver is unremarkable in appearance.

Vague soft tissue inflammation is noted about the gallbladder,
raising concern for acute cholecystitis. The common bile duct
remains grossly normal in caliber.

Pancreas: The pancreas is within normal limits.

Spleen: The spleen is unremarkable in appearance.

Adrenals/Urinary Tract: The adrenal glands are unremarkable in
appearance.

Bilateral renal cysts are noted, more prominent on the left. Mild
nonspecific perinephric stranding is noted bilaterally. There is no
evidence of hydronephrosis. No renal or ureteral stones are
identified.

Stomach/Bowel: The stomach is unremarkable in appearance. The small
bowel is within normal limits. The appendix is normal in caliber,
without evidence of appendicitis.

Scattered diverticulosis is noted along the descending and sigmoid
colon, without evidence of diverticulitis.

Vascular/Lymphatic: Diffuse calcification is seen along the
abdominal aorta and its branches. The abdominal aorta is otherwise
grossly unremarkable. The inferior vena cava is grossly
unremarkable. No retroperitoneal lymphadenopathy is seen. No pelvic
sidewall lymphadenopathy is identified.

Reproductive: The bladder is mildly distended and grossly
unremarkable. The prostate remains normal in size.

Other: Postoperative change is noted along the anterior aspect of
the lower abdominal and pelvic peritoneum.

Musculoskeletal: No acute osseous abnormalities are identified.
Multilevel vacuum phenomenon is noted at the lower thoracic and
lumbar spine. The visualized musculature is unremarkable in
appearance.
IMPRESSION: 1. Vague soft tissue inflammation about the gallbladder, concerning
for acute cholecystitis.
2. Scattered coronary artery calcifications.
3. Bilateral renal cysts, more prominent on the left.
4. Scattered diverticulosis along the descending and sigmoid colon,
without evidence of diverticulitis.
5. Diffuse aortic atherosclerosis.

## 2018-02-21 ENCOUNTER — Other Ambulatory Visit: Payer: Self-pay | Admitting: Cardiovascular Disease

## 2018-02-22 ENCOUNTER — Telehealth: Payer: Self-pay | Admitting: Cardiovascular Disease

## 2018-02-22 NOTE — Telephone Encounter (Signed)
-----   Message from Festus Aloe, CMA sent at 02/21/2018  1:46 PM EDT ----- Please contact patient for a follow up appointment.  The patient was a Dr. Alvino Chapel patient.  Patient requesting refills  Thanks, Jasmine December

## 2018-02-22 NOTE — Telephone Encounter (Signed)
Patient is scheduled Nov 21

## 2018-03-22 ENCOUNTER — Encounter: Payer: Self-pay | Admitting: Nurse Practitioner

## 2018-03-22 ENCOUNTER — Ambulatory Visit (INDEPENDENT_AMBULATORY_CARE_PROVIDER_SITE_OTHER): Payer: Medicare HMO | Admitting: Nurse Practitioner

## 2018-03-22 VITALS — BP 140/80 | HR 67 | Ht 69.0 in | Wt 167.2 lb

## 2018-03-22 DIAGNOSIS — I10 Essential (primary) hypertension: Secondary | ICD-10-CM | POA: Diagnosis not present

## 2018-03-22 DIAGNOSIS — E782 Mixed hyperlipidemia: Secondary | ICD-10-CM | POA: Diagnosis not present

## 2018-03-22 DIAGNOSIS — I480 Paroxysmal atrial fibrillation: Secondary | ICD-10-CM

## 2018-03-22 MED ORDER — ATORVASTATIN CALCIUM 20 MG PO TABS: 20.0000 mg | ORAL_TABLET | Freq: Every day | ORAL | 3 refills | Status: DC

## 2018-03-22 MED ORDER — ATENOLOL 25 MG PO TABS: 75.0000 mg | ORAL_TABLET | Freq: Every day | ORAL | 3 refills | Status: DC

## 2018-03-22 MED ORDER — APIXABAN 5 MG PO TABS: 5.0000 mg | ORAL_TABLET | Freq: Two times a day (BID) | ORAL | 3 refills | Status: DC
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
## 2018-03-22 NOTE — Patient Instructions (Signed)
Medication Instructions:  Your physician recommends that you continue on your current medications as directed. Please refer to the Current Medication list given to you today.  If you need a refill on your cardiac medications before your next appointment, please call your pharmacy.   Lab work: none If you have labs (blood work) drawn today and your tests are completely normal, you will receive your results only by: Marland Kitchen. MyChart Message (if you have MyChart) OR . A paper copy in the mail If you have any lab test that is abnormal or we need to change your treatment, we will call you to review the results.  Testing/Procedures: none  Follow-Up: At [facility], you and your health needs are our priority.  As part of our continuing mission to provide you with exceptional heart care, we have created designated Provider Care Teams.  These Care Teams include your primary Cardiologist (physician) and Advanced Practice Providers (APPs -  Physician Assistants and Nurse Practitioners) who all work together to provide you with the care you need, when you need it. You will need a follow up appointment in 1 years.  Please call our office 2 months in advance to schedule this appointment.  You may see DR Julien NordmannIMOTHY GOLLAN or one of the following Advanced Practice Providers on your designated Care Team:   Nicolasa Duckinghristopher Berge, NP Eula Listenyan Dunn, PA-C Marisue IvanJacquelyn Visser, PA-C

## 2018-03-23 ENCOUNTER — Telehealth: Payer: Self-pay | Admitting: Cardiovascular Disease

## 2018-03-23 NOTE — Telephone Encounter (Signed)
This encounter was created in error - please disregard.

## 2018-03-23 NOTE — Telephone Encounter (Signed)
Labs reviewed- paper copy given to Ward Givenshris Berge, NP to review. He saw the patient in clinic on 03/22/18.

## 2018-03-23 NOTE — Telephone Encounter (Signed)
Patient dropped off copy of lab work requested by Dr. Mariah MillingGollan Placed in nurse box to be reviewed

## 2018-03-29 ENCOUNTER — Emergency Department
Admission: EM | Admit: 2018-03-29 | Discharge: 2018-03-29 | Disposition: A | Discharge status: Home / Self Care | Payer: Medicare HMO | Attending: Emergency Medicine | Admitting: Emergency Medicine

## 2018-03-29 ENCOUNTER — Other Ambulatory Visit: Payer: Self-pay

## 2018-03-29 ENCOUNTER — Encounter: Payer: Self-pay | Admitting: Emergency Medicine

## 2018-03-29 DIAGNOSIS — Z79899 Other long term (current) drug therapy: Secondary | ICD-10-CM | POA: Diagnosis not present

## 2018-03-29 DIAGNOSIS — Y92018 Other place in single-family (private) house as the place of occurrence of the external cause: Secondary | ICD-10-CM | POA: Insufficient documentation

## 2018-03-29 DIAGNOSIS — S61411A Laceration without foreign body of right hand, initial encounter: Secondary | ICD-10-CM | POA: Diagnosis not present

## 2018-03-29 DIAGNOSIS — I10 Essential (primary) hypertension: Secondary | ICD-10-CM | POA: Diagnosis not present

## 2018-03-29 DIAGNOSIS — Z87891 Personal history of nicotine dependence: Secondary | ICD-10-CM | POA: Insufficient documentation

## 2018-03-29 DIAGNOSIS — Z7901 Long term (current) use of anticoagulants: Secondary | ICD-10-CM | POA: Diagnosis not present

## 2018-03-29 DIAGNOSIS — S6991XA Unspecified injury of right wrist, hand and finger(s), initial encounter: Secondary | ICD-10-CM | POA: Diagnosis present

## 2018-03-29 DIAGNOSIS — Y9301 Activity, walking, marching and hiking: Secondary | ICD-10-CM | POA: Diagnosis not present

## 2018-03-29 DIAGNOSIS — W228XXA Striking against or struck by other objects, initial encounter: Secondary | ICD-10-CM | POA: Diagnosis not present

## 2018-03-29 DIAGNOSIS — Y998 Other external cause status: Secondary | ICD-10-CM | POA: Diagnosis not present

## 2018-03-29 MED ORDER — DOUBLE ANTIBIOTIC 500-10000 UNIT/GM EX OINT
TOPICAL_OINTMENT | Freq: Once | CUTANEOUS | Status: AC
  Administered 2018-03-29: 19:00:00 via TOPICAL

## 2018-03-29 MED ORDER — BACITRACIN-NEOMYCIN-POLYMYXIN 400-5-5000 EX OINT
TOPICAL_OINTMENT | CUTANEOUS | Status: AC
  Filled 2018-03-29: qty 1

## 2018-03-29 MED ORDER — LIDOCAINE-EPINEPHRINE 1 %-1:100000 IJ SOLN
INTRAMUSCULAR | Status: AC
  Filled 2018-03-29: qty 1

## 2018-03-29 NOTE — ED Triage Notes (Signed)
Pt presents to ED with c/o laceration to posterior L hand, states happened at approx 0930 this morning.

## 2018-03-29 NOTE — ED Notes (Signed)
Pt presents to with laceration to his left hand. He hit it on a door lock and it has been bleeding since 0930. Pt takes eliquis. Pt alert & oriented with nad noted.

## 2018-03-29 NOTE — Discharge Instructions (Signed)
Do not get the sutured area wet for 24 hours. After 24 hours, shower/bathe as usual and pat the area dry. °Change the bandage 2 times per day and apply antibiotic ointment. °Leave open to air when at no risk of getting the area dirty, but cover at night before bed. °See your PCP or go to Urgent Care in 10 days for suture removal or sooner for signs or concern of infection. ° °
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
## 2018-05-07 ENCOUNTER — Encounter: Payer: Self-pay | Admitting: Emergency Medicine

## 2018-05-07 ENCOUNTER — Other Ambulatory Visit: Payer: Self-pay

## 2018-05-07 ENCOUNTER — Emergency Department
Admission: EM | Admit: 2018-05-07 | Discharge: 2018-05-07 | Disposition: A | Discharge status: Home / Self Care | Payer: Medicare HMO | Attending: Emergency Medicine | Admitting: Emergency Medicine

## 2018-05-07 DIAGNOSIS — R002 Palpitations: Secondary | ICD-10-CM

## 2018-05-07 DIAGNOSIS — Z79899 Other long term (current) drug therapy: Secondary | ICD-10-CM | POA: Insufficient documentation

## 2018-05-07 DIAGNOSIS — I10 Essential (primary) hypertension: Secondary | ICD-10-CM | POA: Insufficient documentation

## 2018-05-07 DIAGNOSIS — I480 Paroxysmal atrial fibrillation: Secondary | ICD-10-CM | POA: Diagnosis not present

## 2018-05-07 DIAGNOSIS — F419 Anxiety disorder, unspecified: Secondary | ICD-10-CM | POA: Diagnosis not present

## 2018-05-07 DIAGNOSIS — Z87891 Personal history of nicotine dependence: Secondary | ICD-10-CM | POA: Diagnosis not present

## 2018-05-07 DIAGNOSIS — Z9049 Acquired absence of other specified parts of digestive tract: Secondary | ICD-10-CM | POA: Insufficient documentation

## 2018-05-07 DIAGNOSIS — Z7901 Long term (current) use of anticoagulants: Secondary | ICD-10-CM | POA: Insufficient documentation

## 2018-05-07 LAB — CBC
HCT: 44.7 % (ref 39.0–52.0)
Hemoglobin: 15.4 g/dL (ref 13.0–17.0)
MCH: 31.8 pg (ref 26.0–34.0)
MCHC: 34.5 g/dL (ref 30.0–36.0)
MCV: 92.4 fL (ref 80.0–100.0)
Platelets: 181 10*3/uL (ref 150–400)
RBC: 4.84 MIL/uL (ref 4.22–5.81)
RDW: 12.8 % (ref 11.5–15.5)
WBC: 4.8 10*3/uL (ref 4.0–10.5)
nRBC: 0 % (ref 0.0–0.2)

## 2018-05-07 LAB — COMPREHENSIVE METABOLIC PANEL
ALT: 19 U/L (ref 0–44)
AST: 25 U/L (ref 15–41)
Albumin: 3.6 g/dL (ref 3.5–5.0)
Alkaline Phosphatase: 51 U/L (ref 38–126)
Anion gap: 9 (ref 5–15)
BUN: 15 mg/dL (ref 8–23)
CO2: 25 mmol/L (ref 22–32)
Calcium: 9.2 mg/dL (ref 8.9–10.3)
Chloride: 101 mmol/L (ref 98–111)
Creatinine, Ser: 0.86 mg/dL (ref 0.61–1.24)
GFR calc Af Amer: >60 mL/min (ref >60)
GFR calc non Af Amer: >60 mL/min (ref >60)
Glucose, Bld: 114 mg/dL — ABNORMAL HIGH (ref 70–99)
Potassium: 4.1 mmol/L (ref 3.5–5.1)
Sodium: 135 mmol/L (ref 135–145)
Total Bilirubin: 1 mg/dL (ref 0.3–1.2)
Total Protein: 7.5 g/dL (ref 6.5–8.1)

## 2018-05-07 LAB — TROPONIN I
Troponin I: <0.03 ng/mL (ref <0.03)
Troponin I: 0.03 ng/mL (ref <0.03)

## 2018-05-07 MED ORDER — SODIUM CHLORIDE 0.9 % IV BOLUS
1000.0000 mL | Freq: Once | INTRAVENOUS | Status: AC
  Administered 2018-05-07: 1000 mL via INTRAVENOUS

## 2018-05-07 NOTE — ED Notes (Signed)
Pt placed on cardiac monitor, Laura,RN aware that pt is in rm.
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
## 2018-05-07 NOTE — ED Notes (Signed)
Patient with history of Afib. States he has diltiazem 180mg  at home that he is to take PRN per Dr. Mariah Milling. Patient states he took one tablet this morning, PTA. Patient denies CP or SOB. Only complaint is mild dizziness and weakness.

## 2018-05-07 NOTE — ED Notes (Signed)
MD aware of repeat trop results 0.03

## 2018-05-07 NOTE — ED Notes (Signed)
Patient taken graham crackers and peanut butter per MD. Updated on delays and plan of care.

## 2018-05-07 NOTE — ED Triage Notes (Signed)
Pt presents to ED via POV, c/o palpitations since last night. Pt states at home BP and HR: 154/128, HR 151. Pt ambulatory with no difficulty at this time.

## 2018-05-07 NOTE — ED Notes (Signed)
Assumed care of patient aox4, denies pain/ sob reports took home meds and they make him nauseous, nausea gone since Zofran given.. toileted po fluids and crackers. Vss, wife at bedside. Awaiting results to repeat trop and disposition.

## 2018-05-09 ENCOUNTER — Ambulatory Visit: Payer: Medicare HMO | Admitting: Urology

## 2018-05-09 ENCOUNTER — Encounter: Payer: Self-pay | Admitting: Urology

## 2018-05-09 VITALS — BP 147/78 | HR 59 | Ht 69.5 in | Wt 165.7 lb

## 2018-05-09 DIAGNOSIS — Z87438 Personal history of other diseases of male genital organs: Secondary | ICD-10-CM

## 2018-05-09 DIAGNOSIS — N400 Enlarged prostate without lower urinary tract symptoms: Secondary | ICD-10-CM | POA: Diagnosis not present
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
## 2018-05-17 ENCOUNTER — Ambulatory Visit: Payer: Medicare HMO | Admitting: Physician Assistant

## 2018-05-17 ENCOUNTER — Encounter: Payer: Self-pay | Admitting: Physician Assistant

## 2018-05-17 VITALS — BP 136/72 | HR 59 | Ht 69.0 in | Wt 167.0 lb

## 2018-05-17 DIAGNOSIS — I480 Paroxysmal atrial fibrillation: Secondary | ICD-10-CM | POA: Diagnosis not present

## 2018-05-17 DIAGNOSIS — I10 Essential (primary) hypertension: Secondary | ICD-10-CM

## 2018-05-17 DIAGNOSIS — E782 Mixed hyperlipidemia: Secondary | ICD-10-CM | POA: Diagnosis not present

## 2018-05-17 MED ORDER — DILTIAZEM HCL 30 MG PO TABS: 30.0000 mg | ORAL_TABLET | Freq: Three times a day (TID) | ORAL | 6 refills | Status: DC | PRN

## 2018-05-17 NOTE — Patient Instructions (Signed)
Medication Instructions:  1- START Diltiazem 1 tablet (30 mg) as needed for HR > 100 every 8 hours.   If you need a refill on your cardiac medications before your next appointment, please call your pharmacy.   Lab work: None ordered   If you have labs (blood work) drawn today and your tests are completely normal, you will receive your results only by: Marland Kitchen MyChart Message (if you have MyChart) OR . A paper copy in the mail If you have any lab test that is abnormal or we need to change your treatment, we will call you to review the results.  Testing/Procedures: None ordered   Follow-Up: At [facility], you and your health needs are our priority.  As part of our continuing mission to provide you with exceptional heart care, we have created designated Provider Care Teams.  These Care Teams include your primary Cardiologist (physician) and Advanced Practice Providers (APPs -  Physician Assistants and Nurse Practitioners) who all work together to provide you with the care you need, when you need it. You will need a follow up appointment in 3 months. You may see Julien Nordmann, MD or one of the following Advanced Practice Providers on your designated Care Team:   Nicolasa Ducking, NP Eula Listen, PA-C . Marisue Ivan, PA-C

## 2018-05-25 IMAGING — US US ABDOMEN LIMITED
1 series · 14 of 25 positions shown · non-contrast
Comparison: CT of the abdomen and pelvis performed 02/08/2017

CLINICAL DATA: Acute onset of generalized abdominal pain, nausea
and vomiting. Leukocytosis and mildly elevated total bilirubin.
Initial encounter.

EXAM:
ULTRASOUND ABDOMEN LIMITED RIGHT UPPER QUADRANT

[Series 1: us abdomen limited · 0.28mm/px · 14 of 37 slices shown]
[im 1/37]
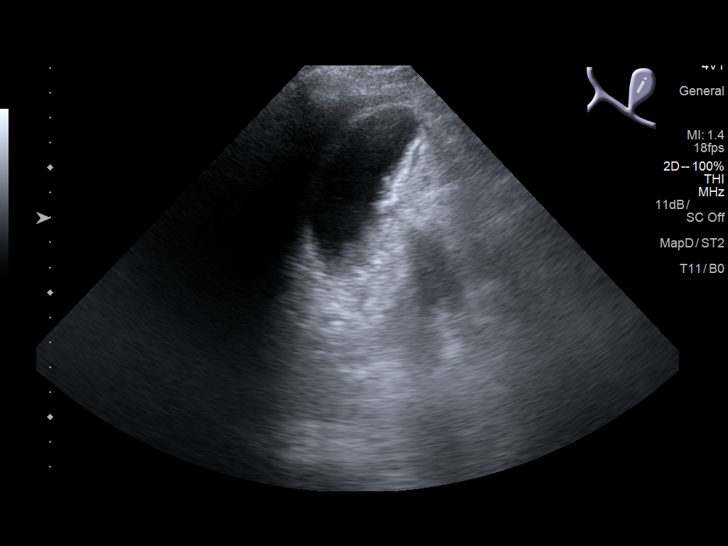
[im 4/37]
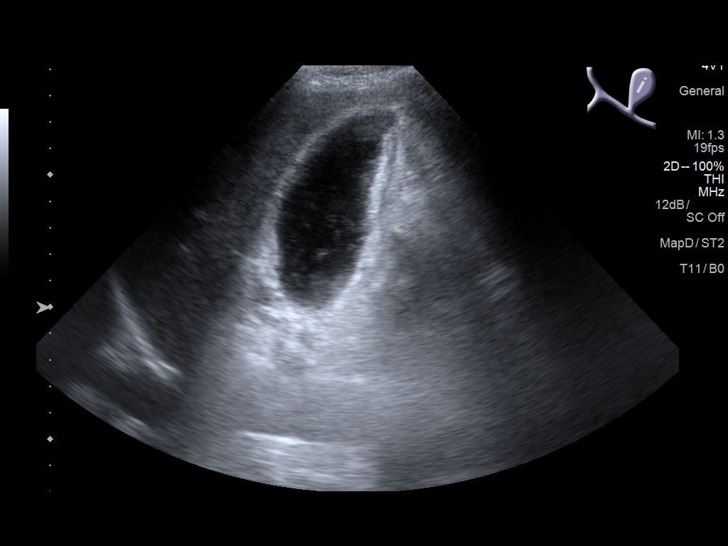
[im 7/37]
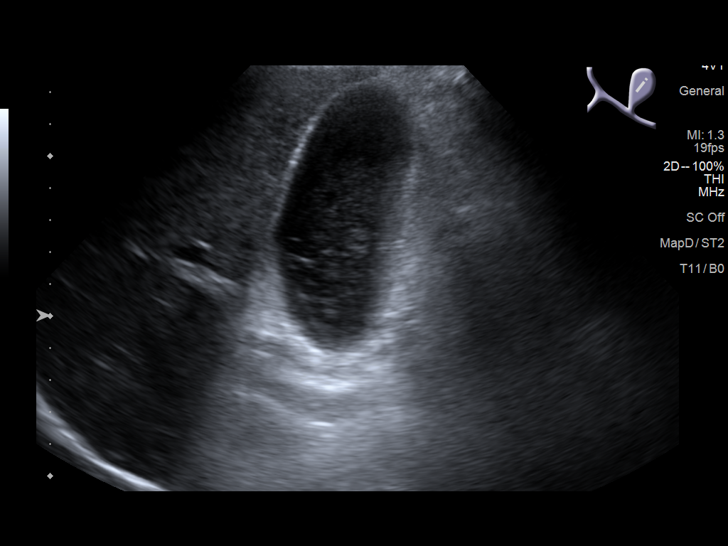
[im 10/37]
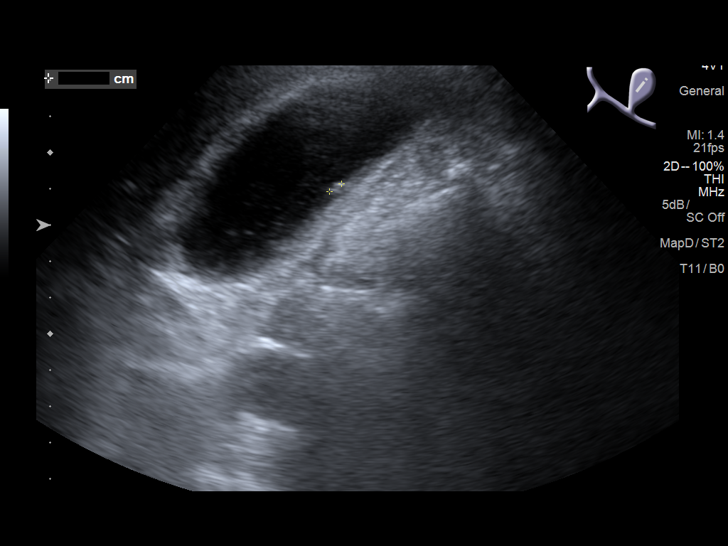
[im 13/37]
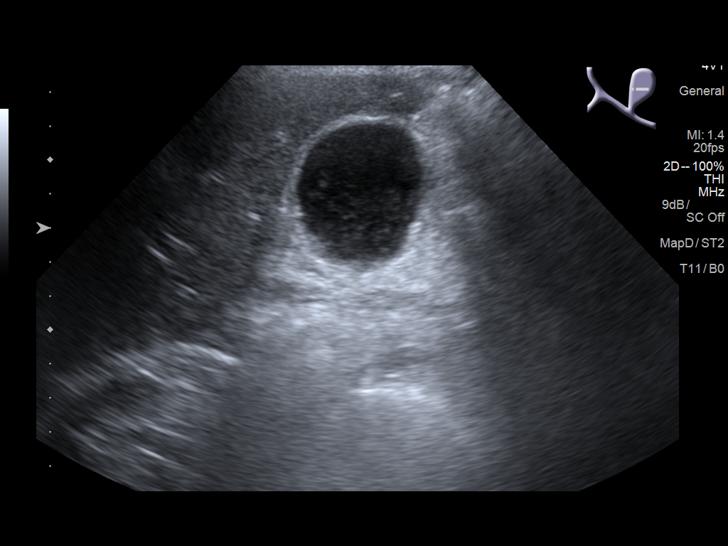
[im 14/37]
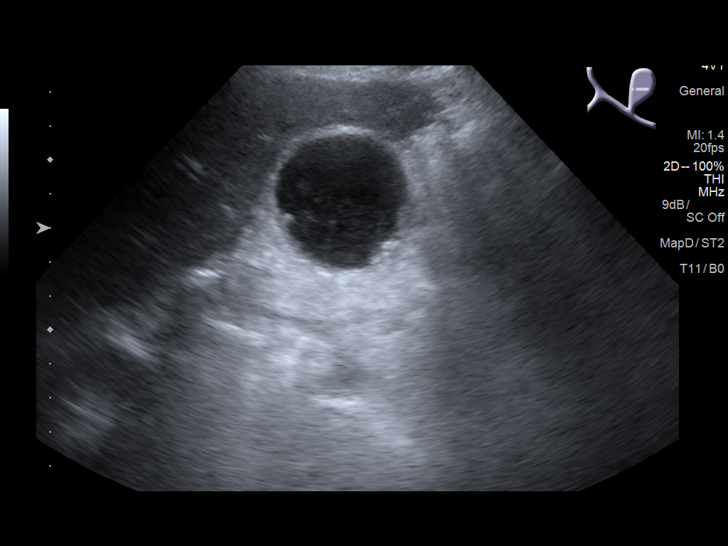
[im 17/37]
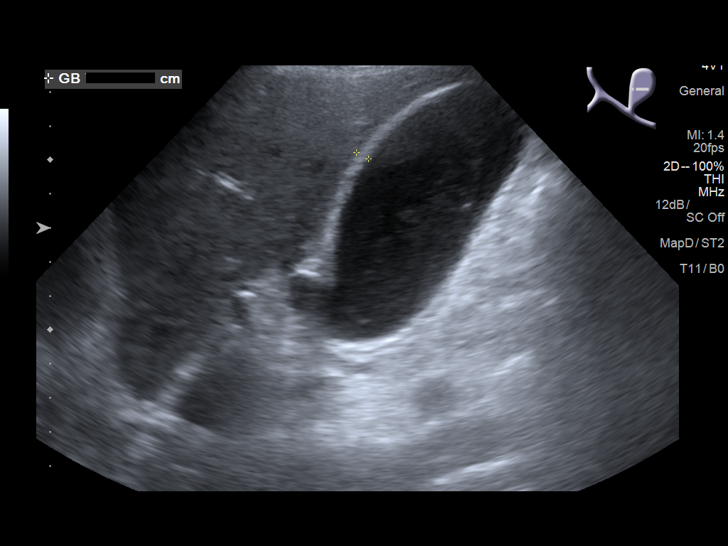
[im 20/37]
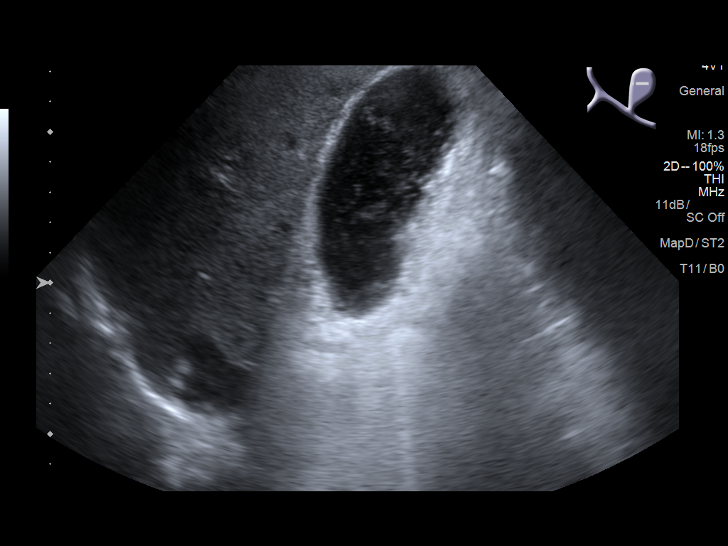
[im 23/37]
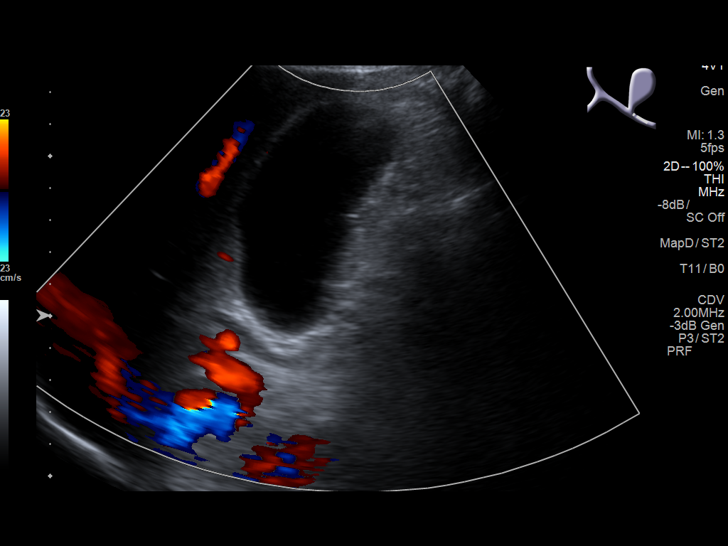
[im 25/37]
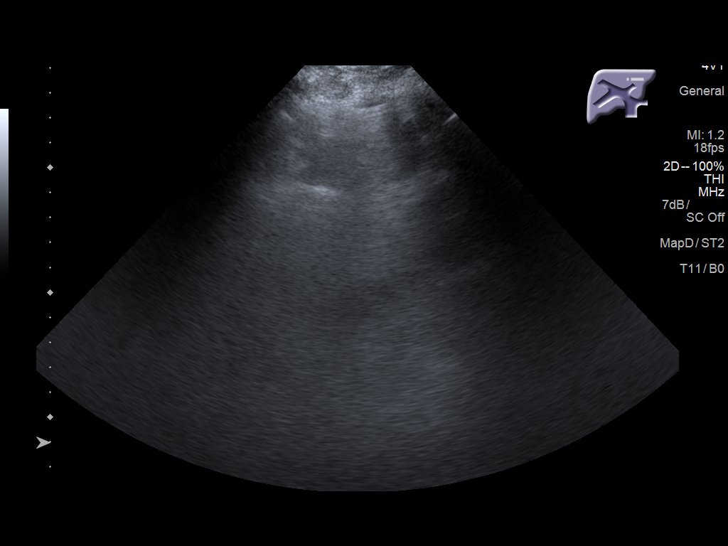
[im 28/37]
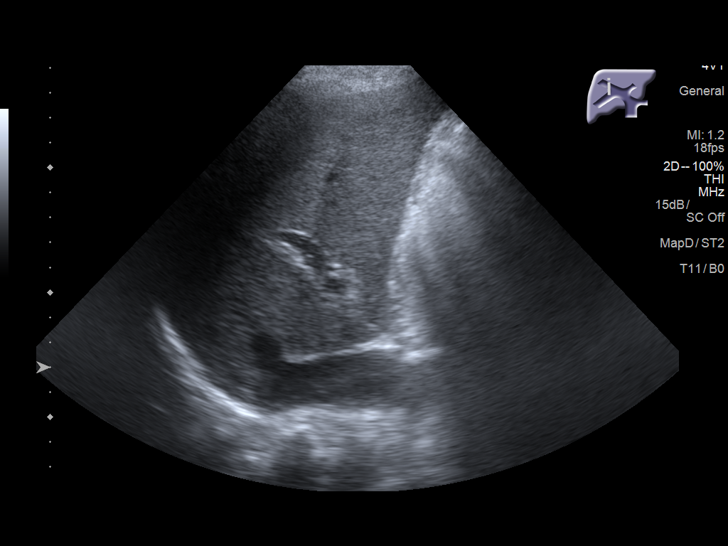
[im 31/37]
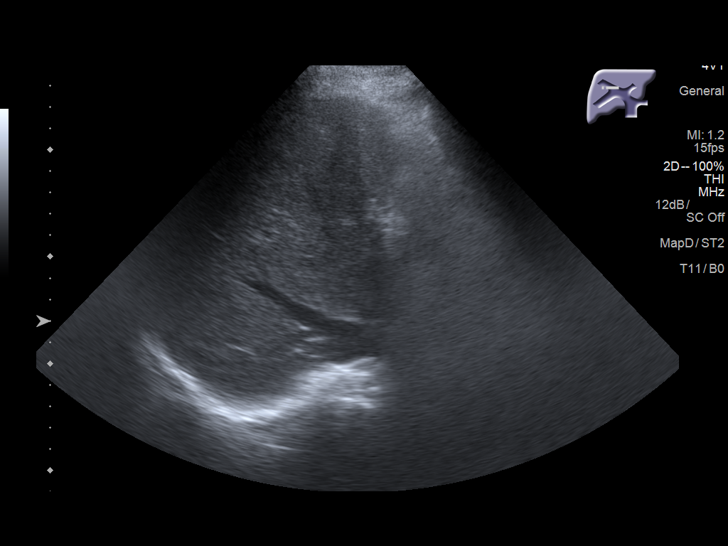
[im 34/37]
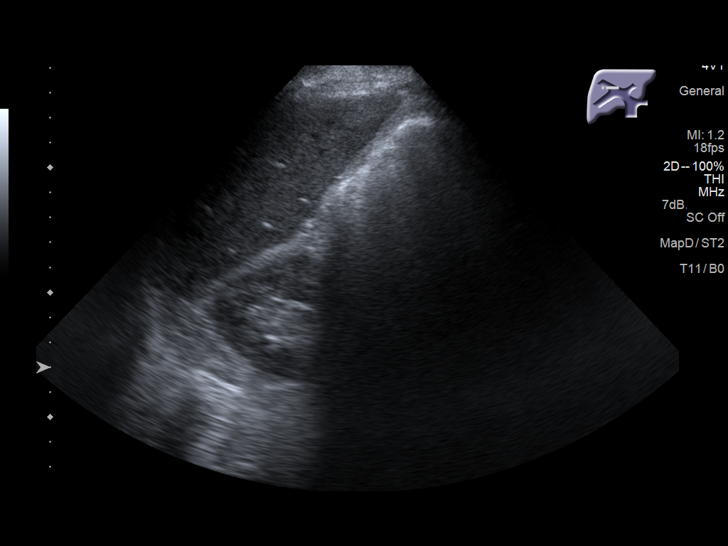
[im 37/37]
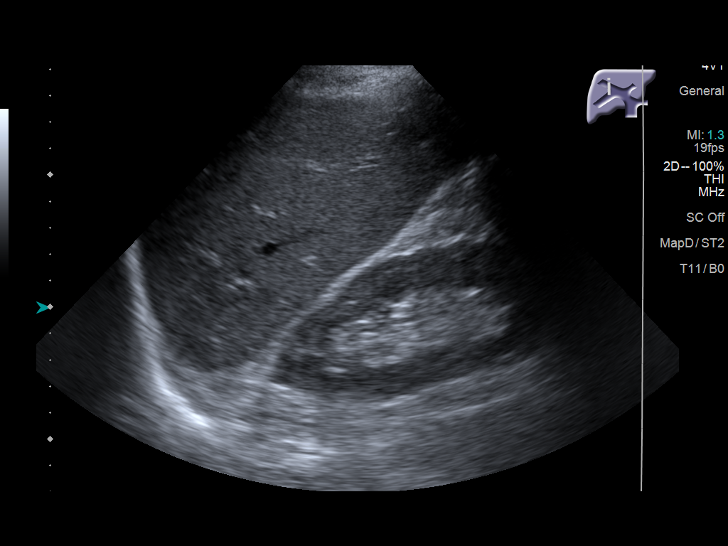

[14 of 25 positions shown; findings below may reference images not displayed]

FINDINGS: Gallbladder:

Mild gallbladder wall thickening is noted. Sludge and stones are
noted within the gallbladder. Stones measure up to 4 mm in size. No
pericholecystic fluid is seen. Evaluation for ultrasonographic
Murphy's sign is limited as the patient is on pain medication.

Common bile duct:

Diameter: 0.4 cm, within normal limits in caliber.

Liver:

No focal lesion identified. Within normal limits in parenchymal
echogenicity. Portal vein is patent on color Doppler imaging with
normal direction of blood flow towards the liver.
IMPRESSION: Mild gallbladder wall thickening, with stones and sludge in the
gallbladder. Evaluation for ultrasonographic Murphy's sign is
limited as the patient is on pain medication. Given the patient's
symptoms and the appearance on CT, this may reflect mild acute
cholecystitis.

## 2018-06-22 ENCOUNTER — Telehealth: Payer: Self-pay | Admitting: Family Medicine

## 2018-06-22 NOTE — Telephone Encounter (Signed)
Tried calling patient to schedule an appointment for medical clearance for his upcoming cataract surgery on 06/26/2018, but no answer and voicemail box was full.  Pt can see either Dr. Sherie Don or Sharyon Cable.

## 2018-06-29 ENCOUNTER — Telehealth: Payer: Self-pay | Admitting: Family Medicine

## 2018-06-29 NOTE — Telephone Encounter (Signed)
I called the patient to schedule his AWV-I with Rosanne Sack.  He said that he just had cataract surgery and will call us back later to schedule it. VDM (DD)

## 2018-07-31 ENCOUNTER — Telehealth: Payer: Self-pay

## 2018-07-31 NOTE — Telephone Encounter (Signed)
Virtual Visit Pre-Appointment Phone Call  Steps For Call:  1. Confirm consent - "In the setting of the current Covid19 crisis, you are scheduled for a TELEPHONE visit with your provider on 08/20/2018 at 8:00am.  Just as we do with many in-office visits, in order for you to participate in this visit, we must obtain consent.  If you'd like, I can send this to your mychart (if signed up) or email for you to review.  Otherwise, I can obtain your verbal consent now.  All virtual visits are billed to your insurance company just like a normal visit would be.  By agreeing to a virtual visit, we'd like you to understand that the technology does not allow for your provider to perform an examination, and thus may limit your provider's ability to fully assess your condition.  Finally, though the technology is pretty good, we cannot assure that it will always work on either your or our end, and in the setting of a video visit, we may have to convert it to a phone-only visit.  In either situation, we cannot ensure that we have a secure connection.  Are you willing to proceed?"  2. Give patient instructions for WebEx download to smartphone as below if video visit  3. Advise patient to be prepared with any vital sign or heart rhythm information, their current medicines, and a piece of paper and pen handy for any instructions they may receive the day of their visit  4. Inform patient they will receive a phone call 15 minutes prior to their appointment time (may be from unknown caller ID) so they should be prepared to answer  5. Confirm that appointment type is correct in Epic appointment notes (video vs telephone)    TELEPHONE CALL NOTE  Thomas [name] has been deemed a candidate for a follow-up tele-health visit to limit community exposure during the Covid-19 pandemic. I spoke with the patient via phone to ensure availability of phone/video source, confirm preferred email & phone number, and discuss  instructions and expectations.  I reminded Thomas [name] to be prepared with any vital sign and/or heart rhythm information that could potentially be obtained via home monitoring, at the time of his visit. I reminded Thomas [name] to expect a phone call at the time of his visit if his visit.  Did the patient verbally acknowledge consent to treatment? YES  Margrett Rud, [place] 07/31/2018 8:34 AM    CONSENT FOR TELE-HEALTH VISIT - PLEASE REVIEW  I hereby voluntarily request, consent and authorize [facility] and its employed or contracted physicians, physician assistants, nurse practitioners or other licensed health care professionals (the Practitioner), to provide me with telemedicine health care services (the "Services") as deemed necessary by the treating Practitioner. I acknowledge and consent to receive the Services by the Practitioner via telemedicine. I understand that the telemedicine visit will involve communicating with the Practitioner through live audiovisual communication technology and the disclosure of certain medical information by electronic transmission. I acknowledge that I have been given the opportunity to request an in-person assessment or other available alternative prior to the telemedicine visit and am voluntarily participating in the telemedicine visit.  I understand that I have the right to withhold or withdraw my consent to the use of telemedicine in the course of my care at any time, without affecting my right to future care or treatment, and that the Practitioner or I may terminate the telemedicine visit at any time. I understand that  I have the right to inspect all information obtained and/or recorded in the course of the telemedicine visit and may receive copies of available information for a reasonable fee.  I understand that some of the potential risks of receiving the Services via telemedicine include:  Marland Kitchen Delay or interruption in medical evaluation due to  technological equipment failure or disruption; . Information transmitted may not be sufficient (e.g. poor resolution of images) to allow for appropriate medical decision making by the Practitioner; and/or  . In rare instances, security protocols could fail, causing a breach of personal health information.  Furthermore, I acknowledge that it is my responsibility to provide information about my medical history, conditions and care that is complete and accurate to the best of my ability. I acknowledge that Practitioner's advice, recommendations, and/or decision may be based on factors not within their control, such as incomplete or inaccurate data provided by me or distortions of diagnostic images or specimens that may result from electronic transmissions. I understand that the practice of medicine is not an exact science and that Practitioner makes no warranties or guarantees regarding treatment outcomes. I acknowledge that I will receive a copy of this consent concurrently upon execution via email to the email address I last provided but may also request a printed copy by calling the office of [facility].    I understand that my insurance will be billed for this visit.   I have read or had this consent read to me. . I understand the contents of this consent, which adequately explains the benefits and risks of the Services being provided via telemedicine.  . I have been provided ample opportunity to ask questions regarding this consent and the Services and have had my questions answered to my satisfaction. . I give my informed consent for the services to be provided through the use of telemedicine in my medical care  By participating in this telemedicine visit I agree to the above.
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
## 2018-08-20 ENCOUNTER — Telehealth (INDEPENDENT_AMBULATORY_CARE_PROVIDER_SITE_OTHER): Payer: Medicare HMO | Admitting: Cardiovascular Disease

## 2018-08-20 ENCOUNTER — Other Ambulatory Visit: Payer: Self-pay

## 2018-08-20 DIAGNOSIS — I10 Essential (primary) hypertension: Secondary | ICD-10-CM

## 2018-08-20 DIAGNOSIS — I480 Paroxysmal atrial fibrillation: Secondary | ICD-10-CM

## 2018-08-20 DIAGNOSIS — F419 Anxiety disorder, unspecified: Secondary | ICD-10-CM | POA: Diagnosis not present

## 2018-08-20 DIAGNOSIS — E782 Mixed hyperlipidemia: Secondary | ICD-10-CM

## 2018-08-20 DIAGNOSIS — I499 Cardiac arrhythmia, unspecified: Secondary | ICD-10-CM

## 2018-08-20 NOTE — Patient Instructions (Addendum)
Medication Instructions:  Your physician recommends that you continue on your current medications as directed. Please refer to the Current Medication list given to you today.  If you need a refill on your cardiac medications before your next appointment, please call your pharmacy.    Lab work: No new labs needed   If you have labs (blood work) drawn today and your tests are completely normal, you will receive your results only by: . MyChart Message (if you have MyChart) OR . A paper copy in the mail If you have any lab test that is abnormal or we need to change your treatment, we will call you to review the results.   Testing/Procedures: No new testing needed   Follow-Up: At [facility], you and your health needs are our priority.  As part of our continuing mission to provide you with exceptional heart care, we have created designated Provider Care Teams.  These Care Teams include your primary Cardiologist (physician) and Advanced Practice Providers (APPs -  Physician Assistants and Nurse Practitioners) who all work together to provide you with the care you need, when you need it.  . You will need a follow up appointment in 12 months .   Please call our office 2 months in advance to schedule this appointment.    . Providers on your designated Care Team:   . Christopher Berge, NP . Ryan Dunn, PA-C . Jacquelyn Visser, PA-C  Any Other Special Instructions Will Be Listed Below (If Applicable).  For educational health videos Log in to : [email or web address] Or : [email or web address], password : [email or web address]   

## 2018-11-12 ENCOUNTER — Telehealth: Payer: Self-pay | Admitting: Cardiovascular Disease

## 2018-11-12 NOTE — Telephone Encounter (Signed)
Patient calling Patient takes Eliquis and medication and it is $300 at pharmacy  Patient will be able to get Eliquis for $9 at the [facility] but will need a letter written on why patient is in need of Eliquis Please mail letter to patient - he will take to [place]

## 2018-11-14 NOTE — Telephone Encounter (Signed)
Ok to write a letter to the [place] Need eliquis for paroxysmal atrial fibrillation

## 2018-11-27 ENCOUNTER — Encounter: Payer: Self-pay | Admitting: *Deleted

## 2018-11-27 NOTE — Telephone Encounter (Signed)
No answer/Voicemail box is full.  

## 2018-11-27 NOTE — Telephone Encounter (Signed)
Letter for [facility] written and placed on Dr. Donivan Scull desk for his signature. Will route this to triage so when he signs we can call patient to let him know it is ready.

## 2018-11-28 NOTE — Telephone Encounter (Signed)
Called patient and let him know we had the letter completed. He requested I mail it to him. Address verified and mailed.

## 2019-05-10 ENCOUNTER — Ambulatory Visit: Payer: Medicare HMO | Admitting: Urology

## 2019-05-10 ENCOUNTER — Encounter: Payer: Self-pay | Admitting: Urology

## 2019-05-22 ENCOUNTER — Ambulatory Visit: Payer: Medicare HMO | Admitting: Urology

## 2019-05-22 ENCOUNTER — Other Ambulatory Visit: Payer: Self-pay

## 2019-05-22 ENCOUNTER — Encounter: Payer: Self-pay | Admitting: Urology

## 2019-05-22 VITALS — BP 148/75 | HR 63 | Ht 69.0 in | Wt 164.2 lb

## 2019-05-22 DIAGNOSIS — Z87438 Personal history of other diseases of male genital organs: Secondary | ICD-10-CM | POA: Diagnosis not present

## 2019-05-22 DIAGNOSIS — N400 Enlarged prostate without lower urinary tract symptoms: Secondary | ICD-10-CM | POA: Diagnosis not present
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
## 2019-08-01 NOTE — Progress Notes (Deleted)
Patient is a 74 year old male who was last seen at [facility] in February 2019 He is followed at the [facility], and also sees cardiology and urology. He follows up today  He was last seen by urology 05/22/2019 for BPH and chronic prostatitis.  It was noted that he has been taking saw palmetto for several years and has no bothersome voiding or prostatitis symptoms.  Based on age, prostate cancer screening has been discontinued.  Last PSA 2019 was 0.43.  Denies dysuria or gross hematuria. He has no flank, abdominal or pelvic pain.  Continued annual follow-up is recommended  His last visit with cardiology was in April 2020 for follow-up after having paroxysmal A. fib postoperatively (laparoscopic cholecystectomy in 2018). Paroxysmal atrial fibrillation (HCC)-Postoperative atrial fibrillation,  He will stay on anticoagulation for now (eloquis) Using diltiazem 30 mg tabs as needed Stay on atenolol 75 mg daily Mixed hyperlipidemia Cholesterol is at goal on the current lipid regimen. No changes to the medications were made. Stable Essential hypertension Blood pressure is well controlled on today's visit. No changes made to the medications. A follow-up in 12 months was recommended.  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, dust, grass, pines, beef Saw specialist in the past, and had allergy shots for years  High blood pressure; well-controlled -noted he checks BP at home and usually 110-120, bottom number in the 50-60 range; not dizzy or light-headed  High cholesterol; taking meds for cholesterol, tries to watch fats in his diet Lab Results  Component Value Date   CHOL 114 06/09/2017   HDL 40 (L) 06/09/2017   LDLCALC 57 06/09/2017   TRIG 83 06/09/2017   CHOLHDL 2.9 06/09/2017   Atrial fibrillation; sees cardiologist  Hyperglycemia - no  diagnosis of diabetes in his past Lab Results  Component Value Date   HGBA1C 5.7 (H) 06/09/2017

## 2019-08-02 ENCOUNTER — Encounter: Payer: Self-pay | Admitting: Internal Medicine

## 2019-08-02 ENCOUNTER — Other Ambulatory Visit: Payer: Self-pay

## 2019-08-02 ENCOUNTER — Ambulatory Visit (INDEPENDENT_AMBULATORY_CARE_PROVIDER_SITE_OTHER): Payer: Medicare HMO | Admitting: Internal Medicine

## 2019-08-02 ENCOUNTER — Ambulatory Visit: Payer: Medicare HMO | Admitting: Internal Medicine

## 2019-08-02 VITALS — BP 122/68 | HR 52 | Temp 97.3°F | Resp 14 | Ht 69.0 in | Wt 173.0 lb

## 2019-08-02 DIAGNOSIS — I480 Paroxysmal atrial fibrillation: Secondary | ICD-10-CM

## 2019-08-02 DIAGNOSIS — H6120 Impacted cerumen, unspecified ear: Secondary | ICD-10-CM

## 2019-08-02 DIAGNOSIS — R739 Hyperglycemia, unspecified: Secondary | ICD-10-CM | POA: Diagnosis not present

## 2019-08-02 DIAGNOSIS — I10 Essential (primary) hypertension: Secondary | ICD-10-CM | POA: Diagnosis not present

## 2019-08-02 DIAGNOSIS — E782 Mixed hyperlipidemia: Secondary | ICD-10-CM

## 2019-08-02 DIAGNOSIS — N400 Enlarged prostate without lower urinary tract symptoms: Secondary | ICD-10-CM

## 2019-08-02 DIAGNOSIS — J309 Allergic rhinitis, unspecified: Secondary | ICD-10-CM | POA: Diagnosis not present

## 2019-08-02 DIAGNOSIS — H1013 Acute atopic conjunctivitis, bilateral: Secondary | ICD-10-CM

## 2019-08-02 NOTE — Patient Instructions (Addendum)
Please return next week in the morning to get labs drawn, please be fasting (ok to drink water, no food after 8 pm the night prior)  Please call your cardiologist to make a follow up appointment as was planned for this April.

## 2019-08-02 NOTE — Progress Notes (Signed)
Patient ID: Thomas [name], male    DOB: 1946/04/04, 74 y.o.   MRN: [id number]  PCP: Arnetha Courser, MD  No chief complaint on file.   Subjective:   Thomas [name] is a 74 y.o. male, presents to clinic with CC of the following:  No chief complaint on file.   HPI:  Patient is a 74 year old male who was last seen at [facility] in February 2019 He is followed by cardiology and urology.  Was followed here by Dr. Sanda Klein, although has not been seen here very recently. He follows up today as noted he does enjoy being seen here periodically.  He was last seen by urology 05/22/2019 for BPH and chronic prostatitis. It was noted that he has been taking saw palmetto for several years and has no bothersome voiding or prostatitis symptoms. Based on age, prostate cancer screening has been discontinued. Last PSA 2019 was 0.43. Denies dysuria or gross hematuria. He has no flank, abdominal or pelvic pain.  Continued annual follow-up is recommended  His last visit with cardiology was in April 2020 for follow-up after having paroxysmal A. fib postoperatively (laparoscopic cholecystectomy in 2018). Paroxysmal atrial fibrillation (HCC)-Postoperative atrial fibrillation, He will stay on anticoagulation for now (eloquis) Using diltiazem 30 mg tabs as needed Stay on atenolol 75 mg daily Mixed hyperlipidemia Cholesterol is at goal on the current lipid regimen. No changes to the medications were made.Stable Essential hypertension Blood pressure is well controlled on today's visit. No changes made to the medications. A follow-up in 12 months was recommended.  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 Checks BP and HR 2-3X/day.  High cholesterol; on statin, denies myalgias Has not had a recent lipid panel. Recent Labs'[]'$ Expand by Default       Lab Results  Component Value Date   CHOL 114 06/09/2017   HDL 40 (L) 06/09/2017   LDLCALC 57 06/09/2017   TRIG 83 06/09/2017   CHOLHDL 2.9 06/09/2017     Atrial fibrillation; sees cardiologist.  Asked if they had discussed at some point coming off the anticoagulant, and he does not recall having that conversation.  He remains on the apixaban.  He noted he does not have a scheduled follow-up with cardiology yet, and the plan was to be seen back this April based on her last note.  Hyperglycemia - no diagnosis of diabetes in his past Recent Labs       Lab Results  Component Value Date   HGBA1C 5.7 (H) 06/09/2017        Lives by self. No problems with home activities.  Still driving. Has a daughter in [place], a son and a real good friend for support.She is with him today (although not during this visit).  Patient Active Problem List   Diagnosis Date  Noted  . Allergic conjunctivitis and rhinitis 06/09/2017  . New onset a-fib ([place]) 02/13/2017  . Paroxysmal atrial fibrillation ([place]) 02/12/2017  . Cholecystitis 02/09/2017  . Colon cancer screening 11/30/2016  . Dysphagia 11/25/2015  . Hyperglycemia 11/25/2015  . Arrhythmia 09/23/2015  . Medication monitoring encounter 01/20/2015  . Anxiety 11/20/2014  . Hypertension   . Hyperlipidemia   . Arthritis   . Insomnia   . BPH (benign prostatic hyperplasia)   .  History of chronic prostatitis 04/29/2013      Current Outpatient Medications:  .  apixaban (ELIQUIS) 5 MG TABS tablet, Take 1 tablet (5 mg total) by mouth 2 (two) times daily., Disp: 180 tablet, Rfl: 3 .  atenolol (TENORMIN) 25 MG tablet, Take 3 tablets (75 mg total) by mouth daily., Disp: 270 tablet, Rfl: 3 .  atorvastatin (LIPITOR) 20 MG tablet, Take 1 tablet (20 mg total) by mouth at bedtime., Disp: 90 tablet, Rfl: 3 .  diltiazem (CARDIZEM) 30 MG tablet, Take 1 tablet (30 mg total) by mouth every 8 (eight) hours as needed (for heart rate greater than 100 beats per minute)., Disp: 60 tablet, Rfl: 6 .  Flaxseed, Linseed, (RA FLAX SEED OIL 1000 PO), Take 1 tablet by mouth daily., Disp: , Rfl:  .  fluticasone (FLONASE) 50 MCG/ACT nasal spray, Place 2 sprays into both nostrils daily., Disp: , Rfl:  .  ketotifen (ZADITOR) 0.025 % ophthalmic solution, Place 1 drop into both eyes 2 (two) times daily., Disp: , Rfl:  .  loratadine (CLARITIN) 10 MG tablet, Take 10 mg by mouth daily., Disp: , Rfl:  .  montelukast (SINGULAIR) 10 MG tablet, Take 1 tablet (10 mg total) by mouth at bedtime., Disp: 30 tablet, Rfl: 11 .  Multiple Vitamin (MULTIVITAMIN) capsule, Take 1 capsule by mouth daily., Disp: , Rfl:  .  Red Yeast Rice Extract (RED YEAST RICE PO), Take 800 mg by mouth daily., Disp: , Rfl:  .  Saw Palmetto 450 MG CAPS, Take 3 capsules by mouth daily., Disp: , Rfl:  .  traMADol (ULTRAM) 50 MG tablet, Take 50 mg by mouth 2 (two) times daily as needed. , Disp: , Rfl:  .  triamcinolone cream (KENALOG) 0.1 %, APPLY CREAM EXTERNALLY TO AFFECTED AREA TWICE DAILY, Disp: , Rfl:    Allergies  Allergen Reactions  . Penicillins Itching and Swelling  . Flexeril [Cyclobenzaprine] Itching and Swelling  . Proscar [Finasteride] Other (See Comments)    dizziness     Past Surgical History:  Procedure Laterality Date  . CHOLECYSTECTOMY N/A 02/09/2017   Procedure: LAPAROSCOPIC CHOLECYSTECTOMY;  Surgeon: Florene Glen, MD;  Location: [facility] ORS;  Service: General;  Laterality: N/A;  . HERNIA REPAIR     multiple repairs  . RECONSTRUCTION OF NOSE    . TONSILLECTOMY AND ADENOIDECTOMY    . VARICOSE VEIN SURGERY     stripped in legs     Family History  Problem Relation Age of Onset  . Heart disease Mother   . Stroke Mother   . Diabetes Mother   . Heart disease Father   . Heart attack Father   . Diabetes Father   . Hypertension Sister   . Hyperlipidemia Sister   . Diabetes Sister   . Heart disease Sister   . Heart disease Brother   . Heart disease Brother      Social History   Tobacco Use  . Smoking status: Former Smoker    Quit date: 05/02/1970    Years  since quitting: 49.2  . Smokeless tobacco: Never Used  Substance Use Topics  . Alcohol use: No    With staff assistance, above reviewed with the patient today.  ROS: As per HPI, states he gets up once at night to pee at most, denies any marked hesitancy or urgency symptoms, no daytime frequency, no dysuria or blood.  Noted no persistent abdominal pains, no dark black stools, no bleeding per rectum.  Denies any history of any thyroid disease.  No numbness or tingling or weakness in the extremities.  Noted balance is good with walking.  Had 2 Covid vaccination doses, and had some flulike symptoms after the second dose that lasted for about 3 days.  Then they resolved.  Denies any more recent fevers, cough, or other concerning Covid symptoms.  Noted when to get checked for any wax in his ears, the left especially, as he has a history of them getting impacted and difficulty hearing when that happens.  He denied any recent difficulty hearing from the left ear or other ear, also denied any vision changes recently.  Otherwise no specific complaints on a limited and focused system review   No results found for this or any previous visit (from the past 72 hour(s)).   PHQ2/9: Depression screen [facility] 2/9 08/02/2019 05/25/2017 11/30/2016 05/31/2016  11/25/2015  Decreased Interest 0 0 0 0 0  Down, Depressed, Hopeless 0 0 0 0 0  PHQ - 2 Score 0 0 0 0 0  Altered sleeping 0 - - - -  Tired, decreased energy 0 - - - -  Change in appetite 0 - - - -  Feeling bad or failure about yourself  0 - - - -  Trouble concentrating 0 - - - -  Moving slowly or fidgety/restless 0 - - - -  Suicidal thoughts 0 - - - -  PHQ-9 Score 0 - - - -  Difficult doing work/chores Not difficult at all - - - -   PHQ-2/9 Result is neg  Fall Risk: Fall Risk  08/02/2019 05/25/2017 11/30/2016 05/31/2016 11/25/2015  Falls in the past year? 0 No No No No  Number falls in past yr: 0 - - - -  Injury with Fall? 0 - - - -      Objective:   Vitals:   08/02/19 1025  BP: 122/68  Pulse: (!) 52  Resp: 14  Temp: (!) 97.3 F (36.3 C)  TempSrc: Temporal  SpO2: 98%  Weight: 173 lb (78.5 kg)  Height: '5\' 9"'$  (1.753 m)    Body mass index is 25.55 kg/m.  Physical Exam   NAD, masked, pleasant HEENT - /AT, sclera anicteric, PERRL, EOMI, conj - non-inj'ed, right external auditory canal had very minimal cerumen, TM clear, left external auditory canal had more cerumen than the right, not impacted, and could see the TM partially and appeared clear, nontender with tugging on the lobes bilaterally, pharynx clear Neck - supple, no adenopathy, no TM, carotids 2+ and = without bruits bilat Car -rhythm regular, mild bradycardia noted, without m/g/r Pulm- RR and effort normal at rest, CTA without wheeze or rales Abd - soft, NT, ND, BS+,  no masses, no HSM Back - no CVA tenderness Skin- no rash noted on exposed areas,  Ext - no LE edema,  Neuro/psychiatric - affect was not flat, appropriate with conversation  Alert and oriented  Grossly non-focal - good strength on testing extremities, sensation intact to LT in distal extremities, DTRs 2+ and equal in  the patella, Romberg negative, no pronator drift, good finger-to-nose, was able to briefly balance on 1 foot, gait normal with good tandem  walk  Speech  normal   Results for orders placed or performed during the hospital encounter of 05/07/18  CBC  Result Value Ref Range   WBC 4.8 4.0 - 10.5 K/uL   RBC 4.84 4.22 - 5.81 MIL/uL   Hemoglobin 15.4 13.0 - 17.0 g/dL   HCT 44.7 39.0 - 52.0 %   MCV 92.4 80.0 - 100.0 fL   MCH 31.8 26.0 - 34.0 pg   MCHC 34.5 30.0 - 36.0 g/dL   RDW 12.8 11.5 - 15.5 %   Platelets 181 150 - 400 K/uL   nRBC 0.0 0.0 - 0.2 %  Comprehensive metabolic panel  Result Value Ref Range   Sodium 135 135 - 145 mmol/L   Potassium 4.1 3.5 - 5.1 mmol/L   Chloride 101 98 - 111 mmol/L   CO2 25 22 - 32 mmol/L   Glucose, Bld 114 (H) 70 - 99 mg/dL   BUN 15 8 - 23 mg/dL   Creatinine, Ser 0.86 0.61 - 1.24 mg/dL   Calcium 9.2 8.9 - 10.3 mg/dL   Total Protein 7.5 6.5 - 8.1 g/dL   Albumin 3.6 3.5 - 5.0 g/dL   AST 25 15 - 41 U/L   ALT 19 0 - 44 U/L   Alkaline Phosphatase 51 38 - 126 U/L   Total Bilirubin 1.0 0.3 - 1.2 mg/dL   GFR calc non Af Amer >60 >60 mL/min   GFR calc Af Amer >60 >60 mL/min   Anion gap 9 5 - 15  Troponin I - ONCE - STAT  Result Value Ref Range   Troponin I <0.03 <0.03 ng/mL  Troponin I - Once-Timed  Result Value Ref Range   Troponin I 0.03 (HH) <0.03 ng/mL       Assessment & Plan:   1. Essential hypertension Blood pressure is well controlled, and he does check frequently at home as well. Also followed by cardiology to help presently. Continue his current medications. We will check labs, and he will return next week to get a morning fasting lab as was not fasting today. - CBC with Differential/Platelet - COMPLETE METABOLIC PANEL WITH GFR  2. Paroxysmal atrial fibrillation [facility]) Continue with cardiology follow-up Did asked that he call their office and ensure he has a follow-up scheduled in April, and asked as to the recommendations about the long-term need to continue anticoagulation as noted the risk/benefits of this. - CBC with Differential/Platelet  3. Mixed  hyperlipidemia Will return to recheck a lipid panel Continue the statin presently. - CBC with Differential/Platelet - COMPLETE METABOLIC PANEL WITH GFR - Lipid panel  4. Benign prostatic hyperplasia without lower urinary tract symptoms Has been followed by urology. Doing well with absence of recent symptoms of concern Will not continue to follow PSAs per last urology note - Urinalysis, Complete  5. 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  6. Hyperglycemia He has never been labeled as a diabetic, with his A1c slightly elevated last check. Will recheck labs - Hemoglobin A1c - CBC with Differential/Platelet - COMPLETE METABOLIC PANEL WITH GFR - Urinalysis, Complete  7.  Cerumen in the external auditory canals bilaterally, left greater than right, not impacted He noted he has a kit at home  with drops and a bulb syringe, and will utilize to help with clearance.  He notes he has had success with this previously. If not and more problematic, can follow-up.   We will follow-up after the labs return, and at the latest again in 6 months time, sooner as needed.  Continue follow-up with cardiology felt important and await their further input as noted above      Towanda Malkin, MD 08/02/19 10:41 AM

## 2019-08-07 LAB — COMPLETE METABOLIC PANEL WITH GFR
AG Ratio: 1.2 (calc) (ref 1.0–2.5)
ALT: 16 U/L (ref 9–46)
AST: 20 U/L (ref 10–35)
Albumin: 3.8 g/dL (ref 3.6–5.1)
Alkaline phosphatase (APISO): 43 U/L (ref 35–144)
BUN: 18 mg/dL (ref 7–25)
CO2: 32 mmol/L (ref 20–32)
Calcium: 9.3 mg/dL (ref 8.6–10.3)
Chloride: 99 mmol/L (ref 98–110)
Creat: 0.97 mg/dL (ref 0.70–1.18)
GFR, Est African American: 88 mL/min/{1.73_m2} (ref >=60)
GFR, Est Non African American: 76 mL/min/{1.73_m2} (ref >=60)
Globulin: 3.3 g/dL (calc) (ref 1.9–3.7)
Glucose, Bld: 102 mg/dL — ABNORMAL HIGH (ref 65–99)
Potassium: 4.6 mmol/L (ref 3.5–5.3)
Sodium: 136 mmol/L (ref 135–146)
Total Bilirubin: 1.5 mg/dL — ABNORMAL HIGH (ref 0.2–1.2)
Total Protein: 7.1 g/dL (ref 6.1–8.1)

## 2019-08-07 LAB — CBC WITH DIFFERENTIAL/PLATELET
Absolute Monocytes: 398 cells/uL (ref 200–950)
Basophils Absolute: 32 cells/uL (ref 0–200)
Basophils Relative: 0.6 %
Eosinophils Absolute: 286 cells/uL (ref 15–500)
Eosinophils Relative: 5.4 %
HCT: 41.3 % (ref 38.5–50.0)
Hemoglobin: 14 g/dL (ref 13.2–17.1)
Lymphs Abs: 1452 cells/uL (ref 850–3900)
MCH: 32.5 pg (ref 27.0–33.0)
MCHC: 33.9 g/dL (ref 32.0–36.0)
MCV: 95.8 fL (ref 80.0–100.0)
MPV: 10.4 fL (ref 7.5–12.5)
Monocytes Relative: 7.5 %
Neutro Abs: 3132 cells/uL (ref 1500–7800)
Neutrophils Relative %: 59.1 %
Platelets: 178 10*3/uL (ref 140–400)
RBC: 4.31 10*6/uL (ref 4.20–5.80)
RDW: 12.9 % (ref 11.0–15.0)
Total Lymphocyte: 27.4 %
WBC: 5.3 10*3/uL (ref 3.8–10.8)

## 2019-08-07 LAB — URINALYSIS, COMPLETE
Bacteria, UA: NONE SEEN /HPF
Bilirubin Urine: NEGATIVE
Glucose, UA: NEGATIVE
Hgb urine dipstick: NEGATIVE
Hyaline Cast: NONE SEEN /LPF
Ketones, ur: NEGATIVE
Leukocytes,Ua: NEGATIVE
Nitrite: NEGATIVE
Protein, ur: NEGATIVE
Specific Gravity, Urine: 1.02 (ref 1.001–1.03)
Squamous Epithelial / HPF: NONE SEEN /HPF
WBC, UA: NONE SEEN /HPF (ref 0–5)
pH: <=5 (ref 5.0–8.0)

## 2019-08-07 LAB — LIPID PANEL
Cholesterol: 161 mg/dL
HDL: 40 mg/dL
LDL Cholesterol (Calc): 104 mg/dL — ABNORMAL HIGH
Non-HDL Cholesterol (Calc): 121 mg/dL
Total CHOL/HDL Ratio: 4 (calc)
Triglycerides: 82 mg/dL

## 2019-08-07 LAB — HEMOGLOBIN A1C
Hgb A1c MFr Bld: 5.7 % of total Hgb — ABNORMAL HIGH (ref <5.7)
Mean Plasma Glucose: 117 (calc)
eAG (mmol/L): 6.5 (calc)

## 2019-09-12 ENCOUNTER — Other Ambulatory Visit: Payer: Self-pay | Admitting: *Deleted

## 2019-09-12 MED ORDER — ATENOLOL 25 MG PO TABS: 75.0000 mg | ORAL_TABLET | Freq: Every day | ORAL | 0 refills | Status: DC

## 2019-12-13 ENCOUNTER — Other Ambulatory Visit: Payer: Self-pay | Admitting: *Deleted

## 2019-12-13 MED ORDER — ATENOLOL 25 MG PO TABS: 75.0000 mg | ORAL_TABLET | Freq: Every day | ORAL | 0 refills | Status: DC

## 2019-12-17 ENCOUNTER — Telehealth: Payer: Self-pay | Admitting: Cardiovascular Disease

## 2019-12-17 NOTE — Telephone Encounter (Signed)
-----  Message from Margrett Rud, [place] sent at 12/17/2019  4:06 PM EDT ----- Regarding: Appt Needed Patient will need an appointment before I can refill any medications.  If patient does not want to schedule he will need to contact his PCP for refills. Thank you!

## 2019-12-17 NOTE — Telephone Encounter (Signed)
Attempted to schedule no ans no vm  

## 2020-01-30 ENCOUNTER — Telehealth: Payer: Self-pay | Admitting: Cardiovascular Disease

## 2020-01-30 MED ORDER — ATENOLOL 25 MG PO TABS: 75.0000 mg | ORAL_TABLET | Freq: Every day | ORAL | 0 refills | Status: DC

## 2020-01-30 NOTE — Telephone Encounter (Signed)
We can send in refill

## 2020-01-30 NOTE — Telephone Encounter (Signed)
*  STAT* If patient is at the pharmacy, call can be transferred to refill team.   1. Which medications need to be refilled? (please list name of each medication and dose if known) atenolol 25 mg 3xday  2. Which pharmacy/location (including street and city if local pharmacy) is medication to be sent to?[facility] on [address]  3. Do they need a 30 day or 90 day supply? 90  Patient only has 4 days left and is scheduled for 10/29

## 2020-01-30 NOTE — Telephone Encounter (Signed)
Requested Prescriptions   Signed Prescriptions Disp Refills  . atenolol (TENORMIN) 25 MG tablet 270 tablet 0    Sig: Take 3 tablets (75 mg total) by mouth daily.    Authorizing Provider: Antonieta Iba    Ordering User: Kendrick Fries

## 2020-02-04 NOTE — Progress Notes (Signed)
Patient ID: Thomas [name], male    DOB: 10-14-1945, 74 y.o.   MRN: [id number]  PCP: Towanda Malkin, MD  Chief Complaint  Patient presents with  . Follow-up    Subjective:   Thomas [name] is a 74 y.o. male, presents to clinic with CC of the following:  Chief Complaint  Patient presents with  . Follow-up    HPI:  Patient is a 74 year old male Last visit with me was 08/02/2019 Communication with lab results from the prior visit were as follows:  His complete metabolic panel was all normal with the only exception a very slightly high glucose at 102 (better than 114 a year ago). His A1c remains the same at 5.7, just above the normal range and still just falling into the mild prediabetes range. The lipid panel remains ok, with the LDL cholesterol (lousy type)slightly higher at 104, and the total cholesterol also higher than last check at 161. Continuing the medication (statin) is important, as is trying to limit saturated fats in his diet. The complete blood count and urinalysis were both normal.  Keep the planned follow-up in about 6 months as planned after our visit.  Has planned follow-up with cardiology 02/28/2020  Follows up today. Notes all in all he is feeling well.  Has no complaints.   He was last seen by urology 05/22/2019 forBPH and chronic prostatitis.It was noted that he has been taking saw palmetto for several years and has no bothersome voiding or prostatitis symptoms. Based on age,prostate cancer screening has been discontinued. Last PSA 2019 was 0.43. Denies dysuria or gross hematuria. He has no flank, abdominalor pelvic pain.Continued annual follow-up is recommended  His last visit with cardiology was in April 2020 for follow-up after having paroxysmal A. fib postoperatively (laparoscopic cholecystectomy in 2018). Paroxysmal atrial fibrillation (HCC)-Postoperative atrial fibrillation, He will stay on anticoagulation for now(eloquis) Using  diltiazem 30 mg tabs as needed Stay on atenolol 75 mg daily Mixed hyperlipidemia Cholesterol is at goal on the current lipid regimen. No changes to the medications were made.Stable Essential hypertension Blood pressure is well controlled on today's visit. No changes made to the medications. A follow-up in 12 months was recommended, with plan to follow-up later this month   Allergic Rhinitis/conjunctivitis-noted when allergy symptoms become problematic,takes eye drops for itching,alsoclaritin and flonase; allergic to sugar maple, dust, grass, pines, beef Saw specialistin the past,and had allergy shots for years Takes claritin daily, usually worse in Spring, not very problematic in recent past  HTN; well-controlled-notedhechecks BP at home and staying controlled, sometimes the top number a little higher in late afternoon (150-160), but then in the evening is much better. no dizzy or light-headed symptoms,  denies any chest pains, palpitations, shortness of breath, no lower extremity swelling He noted has not needed diltiazem since our last visit except one time several months ago when heart rate was higher.  BP Readings from Last 3 Encounters:  02/05/20 124/78  08/02/19 122/68  05/22/19 (!) 148/75    Hyperlipidemia; on statin,  denies myalgias  Lab Results  Component Value Date   CHOL 161 08/06/2019   HDL 40 08/06/2019   LDLCALC 104 (H) 08/06/2019   TRIG 82 08/06/2019   CHOLHDL 4.0 08/06/2019    Atrial fibrillation; sees cardiologist.  Asked if they had discussed at some point coming off the anticoagulant, and he does not recall having that conversation.  He remains on the apixaban.  Has follow-up with cardiology later this  month  Hyperglycemia- no diagnosis of diabetes in his past Lab Results  Component Value Date   HGBA1C 5.7 (H) 08/06/2019   HGBA1C 5.7 (H) 06/09/2017   HGBA1C 5.7 (H) 11/30/2016   Lab Results  Component Value Date   LDLCALC 104 (H)  08/06/2019   CREATININE 0.97 08/06/2019    Cerumen in the external auditory canals bilaterally -he notes he does have the earwax removal kit at home with the drops, and the left is still a little more problematic than the right at times.  Denies any hearing difficulties on the left presently   Lives by self. No problems with home activities.  Still driving. Has a daughter in [name], a son and a real good friend for support.    Patient Active Problem List   Diagnosis Date Noted  . Allergic conjunctivitis and rhinitis 06/09/2017  . New onset a-fib ([place] [name]) 02/13/2017  . Paroxysmal atrial fibrillation (Thomas [name]) 02/12/2017  . Cholecystitis 02/09/2017  . Colon cancer screening 11/30/2016  . Dysphagia 11/25/2015  . Hyperglycemia 11/25/2015  . Arrhythmia 09/23/2015  . Medication monitoring encounter 01/20/2015  . Anxiety 11/20/2014  . Hypertension   . Hyperlipidemia   . Arthritis   . Insomnia   . BPH (benign prostatic hyperplasia)   . History of chronic prostatitis 04/29/2013      Current Outpatient Medications:  .  apixaban (ELIQUIS) 5 MG TABS tablet, Take 1 tablet (5 mg total) by mouth 2 (two) times daily., Disp: 180 tablet, Rfl: 3 .  atenolol (TENORMIN) 25 MG tablet, Take 3 tablets (75 mg total) by mouth daily., Disp: 270 tablet, Rfl: 0 .  atorvastatin (LIPITOR) 20 MG tablet, Take 1 tablet (20 mg total) by mouth at bedtime., Disp: 90 tablet, Rfl: 3 .  diltiazem (CARDIZEM) 30 MG tablet, Take 1 tablet (30 mg total) by mouth every 8 (eight) hours as needed (for heart rate greater than 100 beats per minute)., Disp: 60 tablet, Rfl: 6 .  Flaxseed, Linseed, (RA FLAX SEED OIL 1000 PO), Take 1 tablet by mouth daily., Disp: , Rfl:  .  fluticasone (FLONASE) 50 MCG/ACT nasal spray, Place 2 sprays into both nostrils daily., Disp: , Rfl:  .  ketotifen (ZADITOR) 0.025 % ophthalmic solution, Place 1 drop into both eyes 2 (two) times daily., Disp: , Rfl:  .  loratadine (CLARITIN) 10 MG tablet,  Take 10 mg by mouth daily., Disp: , Rfl:  .  Multiple Vitamin (MULTIVITAMIN) capsule, Take 1 capsule by mouth daily., Disp: , Rfl:  .  Red Yeast Rice Extract (RED YEAST RICE PO), Take 800 mg by mouth daily., Disp: , Rfl:  .  Saw Palmetto 450 MG CAPS, Take 3 capsules by mouth daily., Disp: , Rfl:  .  traMADol (ULTRAM) 50 MG tablet, Take 50 mg by mouth 2 (two) times daily as needed. , Disp: , Rfl:  .  triamcinolone cream (KENALOG) 0.1 %, APPLY CREAM EXTERNALLY TO AFFECTED AREA TWICE DAILY, Disp: , Rfl:  .  montelukast (SINGULAIR) 10 MG tablet, Take 1 tablet (10 mg total) by mouth at bedtime. (Patient not taking: Reported on 02/05/2020), Disp: 30 tablet, Rfl: 11   Allergies  Allergen Reactions  . Penicillins Itching and Swelling  . Flexeril [Cyclobenzaprine] Itching and Swelling  . Proscar [Finasteride] Other (See Comments)    dizziness     Past Surgical History:  Procedure Laterality Date  . CHOLECYSTECTOMY N/A 02/09/2017   Procedure: LAPAROSCOPIC CHOLECYSTECTOMY;  Surgeon: Florene Glen, MD;  Location: [facility] ORS;  Service: General;  Laterality: N/A;  . HERNIA REPAIR     multiple repairs  . RECONSTRUCTION OF NOSE    . TONSILLECTOMY AND ADENOIDECTOMY    . VARICOSE VEIN SURGERY     stripped in legs     Family History  Problem Relation Age of Onset  . Heart disease Mother   . Stroke Mother   . Diabetes Mother   . Heart disease Father   . Heart attack Father   . Diabetes Father   . Hypertension Sister   . Hyperlipidemia Sister   . Diabetes Sister   . Heart disease Sister   . Heart disease Brother   . Heart disease Brother      Social History   Tobacco Use  . Smoking status: Former Smoker    Quit date: 05/02/1970    Years since quitting: 49.7  . Smokeless tobacco: Never Used  Substance Use Topics  . Alcohol use: No    With staff assistance, above reviewed with the patient today.  ROS: As per HPI, otherwise no specific complaints on a limited and focused system  review   No results found for this or any previous visit (from the past 72 hour(s)).   PHQ2/9: Depression screen [facility] 2/9 02/05/2020 08/02/2019 05/25/2017 11/30/2016 05/31/2016  Decreased Interest 0 0 0 0 0  Down, Depressed, Hopeless 0 0 0 0 0  PHQ - 2 Score 0 0 0 0 0  Altered sleeping - 0 - - -  Tired, decreased energy - 0 - - -  Change in appetite - 0 - - -  Feeling bad or failure about yourself  - 0 - - -  Trouble concentrating - 0 - - -  Moving slowly or fidgety/restless - 0 - - -  Suicidal thoughts - 0 - - -  PHQ-9 Score - 0 - - -  Difficult doing work/chores - Not difficult at all - - -   PHQ-2/9 Result is neg  Fall Risk: Fall Risk  02/05/2020 08/02/2019 05/25/2017 11/30/2016 05/31/2016  Falls in the past year? 0 0 No No No  Number falls in past yr: 0 0 - - -  Injury with Fall? 0 0 - - -  Follow up Falls evaluation completed - - - -      Objective:   Vitals:   02/05/20 0737  BP: 124/78  Pulse: (!) 56  Resp: 16  Temp: (!) 97.5 F (36.4 C)  TempSrc: Oral  SpO2: 99%  Weight: 162 lb 6.4 oz (73.7 kg)  Height: $Remove'5\' 9"'Hzabdij$  (1.753 m)    Body mass index is 23.98 kg/m.  Physical Exam   NAD, masked, pleasant, looks well HEENT - [place]/AT, sclera anicteric, PERRL, EOMI, conj - non-inj'ed, right external auditory canal had very minimal cerumen, TM clear, left external auditory canal had more cerumen than the right, not impacted, and could see the TM partially, nontender with tugging on the lobes bilaterally, pharynx clear Neck - supple, no adenopathy, no TM, carotids 2+ and = without bruits bilat Car -rhythm regular, mild bradycardia noted, without m/g/r Pulm- RR and effort normal at rest, CTA without wheeze or rales Abd - soft, NT diffusely, ND,  Back - no CVA tenderness  Ext - no LE edema,  Neuro/psychiatric - affect was not flat, appropriate with conversation             Alert              Grossly non-focal - good strength on  testing extremities, sensation intact to LT in distal  extremities, able to rise from the chair and get up on the exam table, up the one step without difficulty             Speech  normal   Results for orders placed or performed in visit on 08/02/19  Hemoglobin A1c  Result Value Ref Range   Hgb A1c MFr Bld 5.7 (H) <5.7 % of total Hgb   Mean Plasma Glucose 117 (calc)   eAG (mmol/L) 6.5 (calc)  CBC with Differential/Platelet  Result Value Ref Range   WBC 5.3 3.8 - 10.8 Thousand/uL   RBC 4.31 4.20 - 5.80 Million/uL   Hemoglobin 14.0 13.2 - 17.1 g/dL   HCT 41.3 38 - 50 %   MCV 95.8 80.0 - 100.0 fL   MCH 32.5 27.0 - 33.0 pg   MCHC 33.9 32.0 - 36.0 g/dL   RDW 12.9 11.0 - 15.0 %   Platelets 178 140 - 400 Thousand/uL   MPV 10.4 7.5 - 12.5 fL   Neutro Abs 3,132 1,500 - 7,800 cells/uL   Lymphs Abs 1,452 850 - 3,900 cells/uL   Absolute Monocytes 398 200 - 950 cells/uL   Eosinophils Absolute 286 15 - 500 cells/uL   Basophils Absolute 32 0 - 200 cells/uL   Neutrophils Relative % 59.1 %   Total Lymphocyte 27.4 %   Monocytes Relative 7.5 %   Eosinophils Relative 5.4 %   Basophils Relative 0.6 %  COMPLETE METABOLIC PANEL WITH GFR  Result Value Ref Range   Glucose, Bld 102 (H) 65 - 99 mg/dL   BUN 18 7 - 25 mg/dL   Creat 0.97 0.70 - 1.18 mg/dL   GFR, Est Non African American 76 > OR = 60 mL/min/1.62m2   GFR, Est African American 88 > OR = 60 mL/min/1.80m2   BUN/Creatinine Ratio NOT APPLICABLE 6 - 22 (calc)   Sodium 136 135 - 146 mmol/L   Potassium 4.6 3.5 - 5.3 mmol/L   Chloride 99 98 - 110 mmol/L   CO2 32 20 - 32 mmol/L   Calcium 9.3 8.6 - 10.3 mg/dL   Total Protein 7.1 6.1 - 8.1 g/dL   Albumin 3.8 3.6 - 5.1 g/dL   Globulin 3.3 1.9 - 3.7 g/dL (calc)   AG Ratio 1.2 1.0 - 2.5 (calc)   Total Bilirubin 1.5 (H) 0.2 - 1.2 mg/dL   Alkaline phosphatase (APISO) 43 35 - 144 U/L   AST 20 10 - 35 U/L   ALT 16 9 - 46 U/L  Lipid panel  Result Value Ref Range   Cholesterol 161 <200 mg/dL   HDL 40 > OR = 40 mg/dL   Triglycerides 82 <150 mg/dL    LDL Cholesterol (Calc) 104 (H) mg/dL (calc)   Total CHOL/HDL Ratio 4.0 <5.0 (calc)   Non-HDL Cholesterol (Calc) 121 <130 mg/dL (calc)  Urinalysis, Complete  Result Value Ref Range   Color, Urine YELLOW YELLOW   APPearance CLEAR CLEAR   Specific Gravity, Urine 1.020 1.001 - 1.03   pH < OR = 5.0 5.0 - 8.0   Glucose, UA NEGATIVE NEGATIVE   Bilirubin Urine NEGATIVE NEGATIVE   Ketones, ur NEGATIVE NEGATIVE   Hgb urine dipstick NEGATIVE NEGATIVE   Protein, ur NEGATIVE NEGATIVE   Nitrite NEGATIVE NEGATIVE   Leukocytes,Ua NEGATIVE NEGATIVE   WBC, UA NONE SEEN 0 - 5 /HPF   RBC / HPF 0-2 0 - 2 /HPF   Squamous Epithelial / LPF NONE  SEEN < OR = 5 /HPF   Bacteria, UA NONE SEEN NONE SEEN /HPF   Hyaline Cast NONE SEEN NONE SEEN /LPF   Last Labs reviewed    Assessment & Plan:    1. Essential hypertension/mild bradycardia Blood pressure remains controlled presently Discussed the goal of keeping the average blood pressure at a certain level, with the slight elevation noted in the late afternoons not of more concern presently.  Noted the goals are keeping the peaks from getting too high and the nadir is too low.  He has had no concerning symptoms in the recent past. Continues to follow with cardiology, and has an appointment with them later this month. Mild bradycardia secondary to his medication regimen, and no symptoms of concern in recent past  2. Mixed hyperlipidemia Reviewed his last lipid panel with the LDL slightly above desired range, Continue the statin presently Also continue with dietary modifications to help.  3. Paroxysmal atrial fibrillation [facility]) Has appointment with cardiology later this month. Question if needs to continue the apixaban long-term, and await their input.  4. 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  5. Benign prostatic  hyperplasia without lower urinary tract symptoms Continues to see urology, denied increased symptoms in the recent past  6. Hyperglycemia No history of diabetes, with last lab checks okay including an A1c.  7.  Cerumen in the external auditory canals bilaterally, left greater than right, not impacted He noted he has a kit at home with drops and a bulb syringe, and will continue to utilize to help with clearance.  He notes he has had success with this previously. If not and more problematic, can follow-up.  He did have the Covid vaccine, and he asked about a booster today, and encouraged him to obtain one and can get information on locations available through the [facility] health website or with the staff upfront on the way out. Flu shot was given today.  Tentatively, schedule follow-up in 6 months time, sooner as needed.  Plan to recheck labs again on that follow-up visit, at the latest.    Towanda Malkin, MD 02/05/20 7:42 AM

## 2020-02-05 ENCOUNTER — Other Ambulatory Visit: Payer: Self-pay

## 2020-02-05 ENCOUNTER — Encounter: Payer: Self-pay | Admitting: Internal Medicine

## 2020-02-05 ENCOUNTER — Ambulatory Visit (INDEPENDENT_AMBULATORY_CARE_PROVIDER_SITE_OTHER): Payer: Medicare HMO | Admitting: Internal Medicine

## 2020-02-05 VITALS — BP 124/78 | HR 56 | Temp 97.5°F | Resp 16 | Ht 69.0 in | Wt 162.4 lb

## 2020-02-05 DIAGNOSIS — R739 Hyperglycemia, unspecified: Secondary | ICD-10-CM

## 2020-02-05 DIAGNOSIS — Z23 Encounter for immunization: Secondary | ICD-10-CM

## 2020-02-05 DIAGNOSIS — J309 Allergic rhinitis, unspecified: Secondary | ICD-10-CM

## 2020-02-05 DIAGNOSIS — I480 Paroxysmal atrial fibrillation: Secondary | ICD-10-CM | POA: Diagnosis not present

## 2020-02-05 DIAGNOSIS — H1013 Acute atopic conjunctivitis, bilateral: Secondary | ICD-10-CM

## 2020-02-05 DIAGNOSIS — E782 Mixed hyperlipidemia: Secondary | ICD-10-CM

## 2020-02-05 DIAGNOSIS — I10 Essential (primary) hypertension: Secondary | ICD-10-CM

## 2020-02-05 DIAGNOSIS — N400 Enlarged prostate without lower urinary tract symptoms: Secondary | ICD-10-CM
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
## 2020-02-28 ENCOUNTER — Ambulatory Visit: Payer: Medicare HMO | Admitting: Cardiovascular Disease

## 2020-02-28 ENCOUNTER — Other Ambulatory Visit: Payer: Self-pay

## 2020-02-28 ENCOUNTER — Encounter: Payer: Self-pay | Admitting: Cardiovascular Disease

## 2020-02-28 VITALS — BP 130/80 | HR 50 | Ht 70.0 in | Wt 167.2 lb

## 2020-02-28 DIAGNOSIS — I480 Paroxysmal atrial fibrillation: Secondary | ICD-10-CM | POA: Diagnosis not present

## 2020-02-28 DIAGNOSIS — F419 Anxiety disorder, unspecified: Secondary | ICD-10-CM

## 2020-02-28 DIAGNOSIS — I10 Essential (primary) hypertension: Secondary | ICD-10-CM | POA: Diagnosis not present

## 2020-02-28 DIAGNOSIS — E782 Mixed hyperlipidemia: Secondary | ICD-10-CM | POA: Diagnosis not present

## 2020-02-28 MED ORDER — ATENOLOL 25 MG PO TABS: 25.0000 mg | ORAL_TABLET | Freq: Every day | ORAL | 3 refills | Status: DC

## 2020-02-28 NOTE — Patient Instructions (Signed)
Medication Instructions:  Please decrease the atenolol down to 25 mg once a day  If you need a refill on your cardiac medications before your next appointment, please call your pharmacy.    Lab work: No new labs needed   If you have labs (blood work) drawn today and your tests are completely normal, you will receive your results only by: Marland Kitchen MyChart Message (if you have MyChart) OR . A paper copy in the mail If you have any lab test that is abnormal or we need to change your treatment, we will call you to review the results.   Testing/Procedures: No new testing needed   Follow-Up: At [facility], you and your health needs are our priority.  As part of our continuing mission to provide you with exceptional heart care, we have created designated Provider Care Teams.  These Care Teams include your primary Cardiologist (physician) and Advanced Practice Providers (APPs -  Physician Assistants and Nurse Practitioners) who all work together to provide you with the care you need, when you need it.  . You will need a follow up appointment in 12 months  . Providers on your designated Care Team:   . Nicolasa Ducking, NP . Eula Listen, PA-C . Marisue Ivan, PA-C  Any Other Special Instructions Will Be Listed Below (If Applicable).  COVID-19 Vaccine Information can be found at: [email or web address] For questions related to vaccine distribution or appointments, please email [email or web address] or call [phone].

## 2020-04-16 ENCOUNTER — Other Ambulatory Visit: Payer: Self-pay

## 2020-04-16 ENCOUNTER — Ambulatory Visit (INDEPENDENT_AMBULATORY_CARE_PROVIDER_SITE_OTHER): Payer: Medicare HMO | Admitting: Internal Medicine

## 2020-04-16 ENCOUNTER — Encounter: Payer: Self-pay | Admitting: Internal Medicine

## 2020-04-16 DIAGNOSIS — N410 Acute prostatitis: Secondary | ICD-10-CM | POA: Diagnosis not present

## 2020-04-16 DIAGNOSIS — J0100 Acute maxillary sinusitis, unspecified: Secondary | ICD-10-CM | POA: Diagnosis not present

## 2020-04-16 MED ORDER — CIPROFLOXACIN HCL 500 MG PO TABS: 500.0000 mg | ORAL_TABLET | Freq: Two times a day (BID) | ORAL | 0 refills | Status: AC

## 2020-04-16 NOTE — Progress Notes (Signed)
Name: Thomas [name]   MRN: [id number]    DOB: 29-Oct-1945   Date:04/16/2020       Progress Note  Subjective  Chief Complaint  Chief Complaint  Patient presents with  . Sinusitis    More pressure on left side then right, eyes watery for 4 days    I connected with  [name] on 04/16/20 at  8:00 AM EST by telephone and verified that I am speaking with the correct person using two identifiers.  I discussed the limitations, risks, security and privacy concerns of performing an evaluation and management service by telephone and the availability of in person appointments. The patient expressed understanding and agreed to proceed. Staff also discussed with the patient that there may be a patient responsible charge related to this service. Patient Location: Home Provider Location: [facility] Additional Individuals present: none  HPI Patient is a 74 year old male Last visit with me was 02/05/2020 To follow-up with cardiology after our visit on 02/28/2020 with that note reviewed. Follows up today via phone visit with sinus issues, noted limitations with this  Covid vaccination status - had vaccine and booster, had flu vaccine Symptoms started 3-4 days ago pain over eyes, more left sided and increased.  Wakes up in the am and not crusted, no discharge, no eye redness, no vision concerns, Having pain under eye on left, thinks a little swollen. Causing eyes to water. No cough,  No marked SOB No fever, feeling feverish No sore throat.  + min Congestion, no marked PND No loss of smell, loss of taste no N/V No  muscle aches No marked loose stools/diarrhea No CP, passing out episodes  Comorbid conditions reviewed No asthma/COPD hx,  No h/o DM, + prediabetes + heart disease,  Former smoker, quit long time ago in 1972  Notes when gets a sinus infection, his prostate starts flaring up and starting again and has some mild discomfort in the privates, no burining with urination, no frequency,  no blood in urine Noted this has happened in past 20 + years Noted has received Cipro before and has no concerns with this  All - PCN, flexeril, proscar  Patient Active Problem List   Diagnosis Date Noted  . Allergic conjunctivitis and rhinitis 06/09/2017  . New onset a-fib (HCC) 02/13/2017  . Paroxysmal atrial fibrillation (HCC) 02/12/2017  . Cholecystitis 02/09/2017  . Colon cancer screening 11/30/2016  . Dysphagia 11/25/2015  . Hyperglycemia 11/25/2015  . Arrhythmia 09/23/2015  . Medication monitoring encounter 01/20/2015  . Anxiety 11/20/2014  . Hypertension   . Hyperlipidemia   . Arthritis   . Insomnia   . BPH (benign prostatic hyperplasia)   . History of chronic prostatitis 04/29/2013    Past Surgical History:  Procedure Laterality Date  . CHOLECYSTECTOMY N/A 02/09/2017   Procedure: LAPAROSCOPIC CHOLECYSTECTOMY;  Surgeon: Lattie Haw, MD;  Location: [facility] ORS;  Service: General;  Laterality: N/A;  . HERNIA REPAIR     multiple repairs  . RECONSTRUCTION OF NOSE    . TONSILLECTOMY AND ADENOIDECTOMY    . VARICOSE VEIN SURGERY     stripped in legs    Family History  Problem Relation Age of Onset  . Heart disease Mother   . Stroke Mother   . Diabetes Mother   . Heart disease Father   . Heart attack Father   . Diabetes Father   . Hypertension Sister   . Hyperlipidemia Sister   . Diabetes Sister   . Heart disease Sister   .  Heart disease Brother   . Heart disease Brother     Social History   Tobacco Use  . Smoking status: Former Smoker    Quit date: 05/02/1970    Years since quitting: 49.9  . Smokeless tobacco: Never Used  Substance Use Topics  . Alcohol use: No     Current Outpatient Medications:  .  apixaban (ELIQUIS) 5 MG TABS tablet, Take 1 tablet (5 mg total) by mouth 2 (two) times daily., Disp: 180 tablet, Rfl: 3 .  atenolol (TENORMIN) 25 MG tablet, Take 1 tablet (25 mg total) by mouth daily., Disp: 90 tablet, Rfl: 3 .  atorvastatin  (LIPITOR) 20 MG tablet, Take 1 tablet (20 mg total) by mouth at bedtime., Disp: 90 tablet, Rfl: 3 .  diltiazem (CARDIZEM) 30 MG tablet, Take 1 tablet (30 mg total) by mouth every 8 (eight) hours as needed (for heart rate greater than 100 beats per minute)., Disp: 60 tablet, Rfl: 6 .  Flaxseed, Linseed, (RA FLAX SEED OIL 1000 PO), Take 1 tablet by mouth daily., Disp: , Rfl:  .  fluticasone (FLONASE) 50 MCG/ACT nasal spray, Place 2 sprays into both nostrils daily., Disp: , Rfl:  .  ketotifen (ZADITOR) 0.025 % ophthalmic solution, Place 1 drop into both eyes 2 (two) times daily., Disp: , Rfl:  .  loratadine (CLARITIN) 10 MG tablet, Take 10 mg by mouth daily., Disp: , Rfl:  .  montelukast (SINGULAIR) 10 MG tablet, Take 1 tablet (10 mg total) by mouth at bedtime., Disp: 30 tablet, Rfl: 11 .  Multiple Vitamin (MULTIVITAMIN) capsule, Take 1 capsule by mouth daily., Disp: , Rfl:  .  Red Yeast Rice Extract (RED YEAST RICE PO), Take 800 mg by mouth daily., Disp: , Rfl:  .  Saw Palmetto 450 MG CAPS, Take 3 capsules by mouth daily., Disp: , Rfl:  .  traMADol (ULTRAM) 50 MG tablet, Take 50 mg by mouth 2 (two) times daily as needed. , Disp: , Rfl:  .  triamcinolone cream (KENALOG) 0.1 %, APPLY CREAM EXTERNALLY TO AFFECTED AREA TWICE DAILY, Disp: , Rfl:  .  ciprofloxacin (CIPRO) 500 MG tablet, Take 1 tablet (500 mg total) by mouth 2 (two) times daily for 10 days., Disp: 20 tablet, Rfl: 0  Allergies  Allergen Reactions  . Penicillins Itching and Swelling  . Flexeril [Cyclobenzaprine] Itching and Swelling  . Proscar [Finasteride] Other (See Comments)    dizziness    With staff assistance, above reviewed with the patient today.  ROS: As per HPI, otherwise no specific complaints on a limited and focused system review   Objective  Virtual encounter, vitals not obtained.  There is no height or weight on file to calculate BMI.  Physical Exam   Appears in NAD via conversation, pleasant Breathing: No  obvious respiratory distress. Speaking in complete sentences Neurological: Pt is alert, Speech is normal Psychiatric: Patient has a normal mood and affect, behavior is normal. Judgment and thought content normal.   No results found for this or any previous visit (from the past 72 hour(s)).  PHQ2/9: Depression screen [facility] 2/9 04/16/2020 02/05/2020 08/02/2019 05/25/2017 11/30/2016  Decreased Interest 0 0 0 0 0  Down, Depressed, Hopeless 0 0 0 0 0  PHQ - 2 Score 0 0 0 0 0  Altered sleeping - - 0 - -  Tired, decreased energy - - 0 - -  Change in appetite - - 0 - -  Feeling bad or failure about yourself  - - 0 - -  Trouble concentrating - - 0 - -  Moving slowly or fidgety/restless - - 0 - -  Suicidal thoughts - - 0 - -  PHQ-9 Score - - 0 - -  Difficult doing work/chores - - Not difficult at all - -   PHQ-2/9 Result reviewed  Fall Risk: Fall Risk  04/16/2020 02/05/2020 08/02/2019 05/25/2017 11/30/2016  Falls in the past year? 0 0 0 No No  Number falls in past yr: 0 0 0 - -  Injury with Fall? 0 0 0 - -  Follow up Falls evaluation completed Falls evaluation completed - - -     Assessment & Plan  1. Acute maxillary sinusitis, recurrence not specified Concern for a sinusitis, and he notes his history of this in the past.  Not having fevers or any discolored mucus of concern noted.  Doubt any major bacterial source to this, although it is possible for an early bacterial sinusitis. Recommended Tylenol products as needed, and he notes he has been using them. Also a Flonase product-take daily, and he states he has that at home and can start taking. The antibiotic started for his prostate concerns, also can help with a sinus infection, and he notes that has been the case in the past. Also if any Covid concerning symptoms arise, such as cough, loss of taste or smell he needs to follow-up and have a Covid test done.  2. Acute prostatitis He notes he often has some prostatitis concerns when he gets a  sinus infection, and that is beginning as well.  He notes that Cipro in the past has been very helpful, and was very anxious to have this prescribed.  He has had no problems taking this medicine in the past. Felt reasonable to prescribe a course of Cipro, and noted if he develops diarrhea on the medicine he needs to stop the medicine and follow-up, and he noted that has never been a problem for him in the past.  - ciprofloxacin (CIPRO) 500 MG tablet; Take 1 tablet (500 mg total) by mouth 2 (two) times daily for 10 days.  Dispense: 20 tablet; Refill: 0  Emphasized the importance of following up if symptoms not improving or more problematic, and he stated he would do so.  I discussed the assessment and treatment plan with the patient. The patient was provided an opportunity to ask questions and all were answered. The patient agreed with the plan and demonstrated an understanding of the instructions.   The patient was advised to call back or seek an in-person evaluation if the symptoms worsen or if the condition fails to improve as anticipated.  I provided 20 minutes of non-face-to-face time during this encounter that included discussing at length patient's sx/history, pertinent pmhx, medications, treatment and follow up plan. This time also included the necessary documentation, orders, and chart review.  Jamelle Haring, MD

## 2020-05-05 ENCOUNTER — Ambulatory Visit: Payer: Medicare HMO

## 2020-05-05 NOTE — Progress Notes (Signed)
Patient ID: Thomas [name], male    DOB: [birth year], 75 y.o.   MRN: [id number]  PCP: Jamelle Haring, MD  Chief Complaint  Patient presents with  . Adenopathy    Subjective:   Thomas [name] is a 75 y.o. male, presents to clinic with CC of the following:  Chief Complaint  Patient presents with  . Adenopathy    HPI:  Patient is a 75 year old male Last office visit with me was 02/25/2020, with a more recent telephone visit for sinusitis/prostatitis concerns noted Follows up today with concerns for a swelling under his arm.  He first noted his symptoms under the right arm about a week ago, with him noting it more swollen and at times felt a burning and itch.  He denied feeling any lumps under the armpit.  No obvious rash noted.  He states he does not apply deodorant as he is allergic to it.  Has not applied anything else topically to this area.  It still occasionally itches and he notes the swelling has gone down some.  Denies any other swelling in the neck area or the other armpit area.  Denies any pain down the upper extremity, no numbness or tingling in the upper extremity.  Also denies any fevers recently.  He did note his sinus symptoms persist with still feeling some pressure and discomfort under the left eye in the maxillary sinus region.  His eyes still water, and notes that is often a symptom when he has a sinus infection.  He states it definitely has improved since our phone conversation, although not completely resolved.  Still has some mild drainage, although not as marked.  Denies any eye redness or vision concerns.  Denies any increased cough or shortness of breath.  He notes that the prostatitis symptoms have improved after taking a course of the Cipro, although have not completely resolved.  Still feels a little discomfort at time persisting, although denies any burning with urination, no frequency, no blood in the urine.  Allergies-penicillin, Flexeril,  Proscar  He notes he does check his blood pressures at home twice daily, and usually run about 130/70-80 range.  He had his atenolol decreased by the cardiologist in late October as his heart rate was lower than desired, and has not been checking his blood pressure since that decrease and has remained controlled.  He still follows with cardiology presently.  He has a history of paroxysmal A. fib with that last note by cardiology reviewed from 02/28/2020.  Assessment/plan was as follows:  Paroxysmal atrial fibrillation (HCC)- Plan: EKG 12-Lead Postoperative atrial fibrillation,  tachypalps in 05/2018, unclear if from atrial fib stay on anticoagulation, elevated CHDAS VASC Using diltiazem 30 mg tabs as needed decrease atenolol to 25 mg daily for bradycardia  Mixed hyperlipidemia Cholesterol is at goal on the current lipid regimen. No changes to the medications were made.  Essential hypertension Lower atenolol for bradycardia, Otherwise stable  Anxiety doing fine, active Stable     Patient Active Problem List   Diagnosis Date Noted  . Allergic conjunctivitis and rhinitis 06/09/2017  . New onset a-fib (HCC) 02/13/2017  . Paroxysmal atrial fibrillation (HCC) 02/12/2017  . Cholecystitis 02/09/2017  . Colon cancer screening 11/30/2016  . Dysphagia 11/25/2015  . Hyperglycemia 11/25/2015  . Arrhythmia 09/23/2015  . Medication monitoring encounter 01/20/2015  . Anxiety 11/20/2014  . Hypertension   . Hyperlipidemia   . Arthritis   . Insomnia   . BPH (benign prostatic  hyperplasia)   . History of chronic prostatitis 04/29/2013      Current Outpatient Medications:  .  apixaban (ELIQUIS) 5 MG TABS tablet, Take 1 tablet (5 mg total) by mouth 2 (two) times daily., Disp: 180 tablet, Rfl: 3 .  atenolol (TENORMIN) 25 MG tablet, Take 1 tablet (25 mg total) by mouth daily., Disp: 90 tablet, Rfl: 3 .  atorvastatin (LIPITOR) 20 MG tablet, Take 1 tablet (20 mg total) by mouth at  bedtime., Disp: 90 tablet, Rfl: 3 .  ciprofloxacin (CIPRO) 500 MG tablet, Take 1 tablet (500 mg total) by mouth 2 (two) times daily for 10 days., Disp: 20 tablet, Rfl: 0 .  diltiazem (CARDIZEM) 30 MG tablet, Take 1 tablet (30 mg total) by mouth every 8 (eight) hours as needed (for heart rate greater than 100 beats per minute)., Disp: 60 tablet, Rfl: 6 .  Flaxseed, Linseed, (RA FLAX SEED OIL 1000 PO), Take 1 tablet by mouth daily., Disp: , Rfl:  .  fluticasone (FLONASE) 50 MCG/ACT nasal spray, Place 2 sprays into both nostrils daily., Disp: , Rfl:  .  ketotifen (ZADITOR) 0.025 % ophthalmic solution, Place 1 drop into both eyes 2 (two) times daily., Disp: , Rfl:  .  loratadine (CLARITIN) 10 MG tablet, Take 10 mg by mouth daily., Disp: , Rfl:  .  montelukast (SINGULAIR) 10 MG tablet, Take 1 tablet (10 mg total) by mouth at bedtime., Disp: 30 tablet, Rfl: 11 .  Multiple Vitamin (MULTIVITAMIN) capsule, Take 1 capsule by mouth daily., Disp: , Rfl:  .  Red Yeast Rice Extract (RED YEAST RICE PO), Take 800 mg by mouth daily., Disp: , Rfl:  .  Saw Palmetto 450 MG CAPS, Take 3 capsules by mouth daily., Disp: , Rfl:  .  traMADol (ULTRAM) 50 MG tablet, Take 50 mg by mouth 2 (two) times daily as needed. , Disp: , Rfl:  .  triamcinolone cream (KENALOG) 0.1 %, APPLY CREAM EXTERNALLY TO AFFECTED AREA TWICE DAILY, Disp: , Rfl:    Allergies  Allergen Reactions  . Penicillins Itching and Swelling  . Flexeril [Cyclobenzaprine] Itching and Swelling  . Proscar [Finasteride] Other (See Comments)    dizziness     Past Surgical History:  Procedure Laterality Date  . CHOLECYSTECTOMY N/A 02/09/2017   Procedure: LAPAROSCOPIC CHOLECYSTECTOMY;  Surgeon: Lattie Haw, MD;  Location: [facility] ORS;  Service: General;  Laterality: N/A;  . HERNIA REPAIR     multiple repairs  . RECONSTRUCTION OF NOSE    . TONSILLECTOMY AND ADENOIDECTOMY    . VARICOSE VEIN SURGERY     stripped in legs     Family History  Problem  Relation Age of Onset  . Heart disease Mother   . Stroke Mother   . Diabetes Mother   . Heart disease Father   . Heart attack Father   . Diabetes Father   . Hypertension Sister   . Hyperlipidemia Sister   . Diabetes Sister   . Heart disease Sister   . Heart disease Brother   . Heart disease Brother      Social History   Tobacco Use  . Smoking status: Former Smoker    Quit date: 05/02/1970    Years since quitting: 50.0  . Smokeless tobacco: Never Used  Substance Use Topics  . Alcohol use: No    With staff assistance, above reviewed with the patient today.  ROS: As per HPI, otherwise no specific complaints on a limited and focused system review  No results found for this or any previous visit (from the past 72 hour(s)).   PHQ2/9: Depression screen [facility] 2/9 05/06/2020 04/16/2020 02/05/2020 08/02/2019 05/25/2017  Decreased Interest 0 0 0 0 0  Down, Depressed, Hopeless 0 0 0 0 0  PHQ - 2 Score 0 0 0 0 0  Altered sleeping - - - 0 -  Tired, decreased energy - - - 0 -  Change in appetite - - - 0 -  Feeling bad or failure about yourself  - - - 0 -  Trouble concentrating - - - 0 -  Moving slowly or fidgety/restless - - - 0 -  Suicidal thoughts - - - 0 -  PHQ-9 Score - - - 0 -  Difficult doing work/chores - - - Not difficult at all -   PHQ-2/9 Result is neg  Fall Risk: Fall Risk  05/06/2020 04/16/2020 02/05/2020 08/02/2019 05/25/2017  Falls in the past year? 0 0 0 0 No  Number falls in past yr: 0 0 0 0 -  Injury with Fall? 0 0 0 0 -  Follow up - Falls evaluation completed Falls evaluation completed - -      Objective:   Vitals:   05/06/20 0913  BP: (!) 152/90  Pulse: 60  Resp: 16  Temp: 98.1 F (36.7 C)  TempSrc: Oral  SpO2: 98%  Weight: 167 lb 3.2 oz (75.8 kg)  Height: 5\' 10"  (1.778 m)    Body mass index is 23.99 kg/m.  Physical Exam  Recheck of blood pressure was 128/82 on the left by myself with an adult cuff. NAD, masked, very pleasant, looks well HEENT -  [place]/AT, sclera anicteric, PERRL, EOMI, conj - non-inj'ed,mild tenderness palpating the left maxillary sinus region, nares patent with some mild erythema in the nares noted, no big swollen turbinates, external auditory canal with some mild cerumen, not impacted, TMs able to be visualized clear. Pharynx clear Neck - supple, no anterior or posterior adenopathy, no rigidity Car -RRR without m/g/r Pulm- RR and effort normal at rest, CTA without wheeze or rales Right axilla without adenopathy, nontender with palpation in this region, no rash evident today.  No marked swelling noted.  No supraclavicular adenopathy palpated. Abd - soft, NT diffusely, ND,  Back - no CVA tenderness  Ext - no LE edema,  Neuro/psychiatric - affect was not flat, appropriate with conversation Alert  Grossly non-focal  Speech normal   Results for orders placed or performed in visit on 08/02/19  Hemoglobin A1c  Result Value Ref Range   Hgb A1c MFr Bld 5.7 (H) <5.7 % of total Hgb   Mean Plasma Glucose 117 (calc)   eAG (mmol/L) 6.5 (calc)  CBC with Differential/Platelet  Result Value Ref Range   WBC 5.3 3.8 - 10.8 Thousand/uL   RBC 4.31 4.20 - 5.80 Million/uL   Hemoglobin 14.0 13.2 - 17.1 g/dL   HCT 10/02/19 78.2 - 95.6 %   MCV 95.8 80.0 - 100.0 fL   MCH 32.5 27.0 - 33.0 pg   MCHC 33.9 32.0 - 36.0 g/dL   RDW 21.3 08.6 - 57.8 %   Platelets 178 140 - 400 Thousand/uL   MPV 10.4 7.5 - 12.5 fL   Neutro Abs 3,132 1,500 - 7,800 cells/uL   Lymphs Abs 1,452 850 - 3,900 cells/uL   Absolute Monocytes 398 200 - 950 cells/uL   Eosinophils Absolute 286 15 - 500 cells/uL   Basophils Absolute 32 0 - 200 cells/uL   Neutrophils Relative %  59.1 %   Total Lymphocyte 27.4 %   Monocytes Relative 7.5 %   Eosinophils Relative 5.4 %   Basophils Relative 0.6 %  COMPLETE METABOLIC PANEL WITH GFR  Result Value Ref Range   Glucose, Bld 102 (H) 65 - 99 mg/dL   BUN 18 7 - 25 mg/dL   Creat 0.97 0.70 -  1.18 mg/dL   GFR, Est Non African American 76 > OR = 60 mL/min/1.47m2   GFR, Est African American 88 > OR = 60 mL/min/1.42m2   BUN/Creatinine Ratio NOT APPLICABLE 6 - 22 (calc)   Sodium 136 135 - 146 mmol/L   Potassium 4.6 3.5 - 5.3 mmol/L   Chloride 99 98 - 110 mmol/L   CO2 32 20 - 32 mmol/L   Calcium 9.3 8.6 - 10.3 mg/dL   Total Protein 7.1 6.1 - 8.1 g/dL   Albumin 3.8 3.6 - 5.1 g/dL   Globulin 3.3 1.9 - 3.7 g/dL (calc)   AG Ratio 1.2 1.0 - 2.5 (calc)   Total Bilirubin 1.5 (H) 0.2 - 1.2 mg/dL   Alkaline phosphatase (APISO) 43 35 - 144 U/L   AST 20 10 - 35 U/L   ALT 16 9 - 46 U/L  Lipid panel  Result Value Ref Range   Cholesterol 161 <200 mg/dL   HDL 40 > OR = 40 mg/dL   Triglycerides 82 <150 mg/dL   LDL Cholesterol (Calc) 104 (H) mg/dL (calc)   Total CHOL/HDL Ratio 4.0 <5.0 (calc)   Non-HDL Cholesterol (Calc) 121 <130 mg/dL (calc)  Urinalysis, Complete  Result Value Ref Range   Color, Urine YELLOW YELLOW   APPearance CLEAR CLEAR   Specific Gravity, Urine 1.020 1.001 - 1.03   pH < OR = 5.0 5.0 - 8.0   Glucose, UA NEGATIVE NEGATIVE   Bilirubin Urine NEGATIVE NEGATIVE   Ketones, ur NEGATIVE NEGATIVE   Hgb urine dipstick NEGATIVE NEGATIVE   Protein, ur NEGATIVE NEGATIVE   Nitrite NEGATIVE NEGATIVE   Leukocytes,Ua NEGATIVE NEGATIVE   WBC, UA NONE SEEN 0 - 5 /HPF   RBC / HPF 0-2 0 - 2 /HPF   Squamous Epithelial / LPF NONE SEEN < OR = 5 /HPF   Bacteria, UA NONE SEEN NONE SEEN /HPF   Hyaline Cast NONE SEEN NONE SEEN /LPF       Assessment & Plan:   1. Dermatitis? axilla Discussed no obvious findings of concern in the right axilla region where he noted some recent mild swelling which has improved.  No adenopathy concerns noted today.  No obvious rash. Discussed may have had a mild irritation/dermatitis causing his symptoms which did include an itch. Felt best not to recommend anything presently as we continue to monitor. Noted if he does start to develop some redness  around the hair follicles in the axilla, may be more like a folliculitis concern, and may need something topical to help at that point pending reassessment. Continue to monitor presently  2. Acute maxillary sinusitis, recurrence not specified He noted his symptoms are better, although not completely resolved. Do feel with an additional Cipro course for his prostate symptoms that have not completely resolved, that should help with the sinus concerns as well if there is any bacterial infectious component. Recommended continuing the Flonase product once daily in combination.  3. Other prostatic inflammatory diseases Noted it often takes a second course of Cipro to help with prostatitis concerns, I feel reasonable to proceed with that. If symptoms not completely resolved or more  concerning symptoms arise, he needs to follow-up to be reassessed. He was understanding of that.  - ciprofloxacin (CIPRO) 500 MG tablet; Take 1 tablet (500 mg total) by mouth 2 (two) times daily for 10 days.  Dispense: 20 tablet; Refill: 0   Await his response, and he is aware that I will be leaving the practice in the very near future, and further follow-ups over time will be with a new provider.    Jamelle Haring, MD 05/06/20 9:45 AM

## 2020-05-06 ENCOUNTER — Encounter: Payer: Self-pay | Admitting: Internal Medicine

## 2020-05-06 ENCOUNTER — Ambulatory Visit (INDEPENDENT_AMBULATORY_CARE_PROVIDER_SITE_OTHER): Payer: Medicare HMO | Admitting: Internal Medicine

## 2020-05-06 ENCOUNTER — Other Ambulatory Visit: Payer: Self-pay

## 2020-05-06 VITALS — BP 152/90 | HR 60 | Temp 98.1°F | Resp 16 | Ht 70.0 in | Wt 167.2 lb

## 2020-05-06 DIAGNOSIS — L309 Dermatitis, unspecified: Secondary | ICD-10-CM | POA: Diagnosis not present

## 2020-05-06 DIAGNOSIS — N418 Other inflammatory diseases of prostate: Secondary | ICD-10-CM | POA: Diagnosis not present

## 2020-05-06 DIAGNOSIS — J0100 Acute maxillary sinusitis, unspecified: Secondary | ICD-10-CM | POA: Diagnosis not present

## 2020-05-06 MED ORDER — CIPROFLOXACIN HCL 500 MG PO TABS: 500.0000 mg | ORAL_TABLET | Freq: Two times a day (BID) | ORAL | 0 refills | Status: AC

## 2020-05-06 NOTE — Patient Instructions (Signed)
Please pick up the antibiotic prescribed at the pharmacy and take twice daily as prescribed.

## 2020-05-21 ENCOUNTER — Ambulatory Visit: Payer: Self-pay | Admitting: Urology

## 2020-05-28 ENCOUNTER — Ambulatory Visit: Payer: Medicare HMO | Admitting: Internal Medicine

## 2020-06-04 ENCOUNTER — Ambulatory Visit: Payer: Self-pay | Admitting: Urology

## 2020-07-24 ENCOUNTER — Other Ambulatory Visit: Payer: Self-pay | Admitting: Internal Medicine

## 2020-07-24 NOTE — Telephone Encounter (Signed)
   Notes to clinic: Patient was last seen by Dr. Dorris Fetch Medication that is being requested was filled by a different provider  Review for refill   Requested Prescriptions  Pending Prescriptions Disp Refills   atenolol (TENORMIN) 25 MG tablet 90 tablet 3    Sig: Take 1 tablet (25 mg total) by mouth daily.      Cardiovascular:  Beta Blockers Failed - 07/24/2020 11:29 AM      Failed - Last BP in normal range    BP Readings from Last 1 Encounters:  05/06/20 (!) 152/90          Passed - Last Heart Rate in normal range    Pulse Readings from Last 1 Encounters:  05/06/20 60          Passed - Valid encounter within last 6 months    Recent Outpatient Visits           2 months ago Dermatitis   [facility] Welford Roche D, MD   3 months ago Acute maxillary sinusitis, recurrence not specified   [facility] Jamelle Haring, MD   5 months ago Essential hypertension   [facility] Jamelle Haring, MD   11 months ago Essential hypertension   [facility] Welford Roche D, MD   3 years ago Allergic conjunctivitis of both eyes and rhinitis   [facility] Lada, Janit Bern, MD

## 2020-07-24 NOTE — Telephone Encounter (Signed)
Medication Refill - Medication:   atenolol (TENORMIN) 25 MG tablet   Has the patient contacted their pharmacy? Yes.  contact office.   Preferred Pharmacy (with phone number or street name):   [facility] [address]), [place] - [address]  [address]) [place] [address]  Phone: [phone] Fax: [phone]    Agent: Please be advised that RX refills may take up to 3 business days. We ask that you follow-up with your pharmacy.

## 2020-08-31 ENCOUNTER — Other Ambulatory Visit: Payer: Self-pay

## 2020-08-31 ENCOUNTER — Encounter: Payer: Self-pay | Admitting: Family Medicine

## 2020-08-31 ENCOUNTER — Ambulatory Visit (INDEPENDENT_AMBULATORY_CARE_PROVIDER_SITE_OTHER): Payer: Medicare HMO | Admitting: Family Medicine

## 2020-08-31 VITALS — BP 130/68 | HR 62 | Ht 69.0 in | Wt 168.2 lb

## 2020-08-31 DIAGNOSIS — N400 Enlarged prostate without lower urinary tract symptoms: Secondary | ICD-10-CM

## 2020-08-31 DIAGNOSIS — E782 Mixed hyperlipidemia: Secondary | ICD-10-CM | POA: Diagnosis not present

## 2020-08-31 DIAGNOSIS — I480 Paroxysmal atrial fibrillation: Secondary | ICD-10-CM | POA: Diagnosis not present

## 2020-08-31 DIAGNOSIS — Z23 Encounter for immunization: Secondary | ICD-10-CM | POA: Diagnosis not present

## 2020-08-31 DIAGNOSIS — I10 Essential (primary) hypertension: Secondary | ICD-10-CM | POA: Diagnosis not present

## 2020-08-31 DIAGNOSIS — R7309 Other abnormal glucose: Secondary | ICD-10-CM

## 2020-08-31 MED ORDER — SHINGRIX 50 MCG/0.5ML IM SUSR: INTRAMUSCULAR | 1 refills | Status: DC

## 2020-08-31 MED ORDER — DILTIAZEM HCL 30 MG PO TABS: 30.0000 mg | ORAL_TABLET | Freq: Three times a day (TID) | ORAL | 2 refills | Status: DC | PRN

## 2020-08-31 NOTE — Assessment & Plan Note (Signed)
Stable A1c 5.7 in past, early PreDM Will recheck as scheduled

## 2020-08-31 NOTE — Patient Instructions (Addendum)
Thank you for coming to the office today.  Refilled Diltiazem only use if needed  Call our office first when ready for your medications. We can authorize the refills.  Shingles vaccine printed, 2 doses 2-6 month apart. At pharmacy  Please schedule a Follow-up Appointment to: Return in about 6 months (around 03/03/2021) for 6 month follow-up Annual Physical AM apt, fasting lab AFTER.  If you have any other questions or concerns, please feel free to call the office or send a message through MyChart. You may also schedule an earlier appointment if necessary.  Additionally, you may be receiving a survey about your experience at our office within a few days to 1 week by e-mail or mail. We value your feedback.  Saralyn Pilar, DO [facility], [place]

## 2020-08-31 NOTE — Assessment & Plan Note (Signed)
Mildly elevated initial BP, repeat manual check improved. - Home BP readings reviewed  Paroxysmal AFib. S/p ablation   Plan:  1. Continue current BP regimen HCTZ 25mg  daily, Atenolol 25mg  daily - Note Diltiazem is only PRN if HR elevated 2. Encourage improved lifestyle - low sodium diet, regular exercise 3. Continue monitor BP outside office, bring readings to next visit, if persistently >140/90 or new symptoms notify office sooner

## 2020-08-31 NOTE — Assessment & Plan Note (Signed)
Controlled on current supplement Saw Palmetto Followed by Urology 
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
## 2020-08-31 NOTE — Assessment & Plan Note (Signed)
Controlled, without atrial fibrillation currently History of paroxysmal problem Followed by [facility] On Atenolol 25mg daily rate control Has PRN Diltiazem only if need for flare up HR >100 RVR, will re order since last rx has expired 

## 2020-08-31 NOTE — Assessment & Plan Note (Signed)
Previously controlled  Plan: 1. Continue current meds - Atorvastatin 20mg  daily - On Red Yeast RIce supplement  Encourage improved lifestyle - low carb/cholesterol, reduce portion size, continue improving regular exercise

## 2021-01-05 ENCOUNTER — Ambulatory Visit: Payer: Medicare HMO

## 2021-01-05 ENCOUNTER — Telehealth: Payer: Self-pay

## 2021-01-05 NOTE — Telephone Encounter (Signed)
This nurse attempted to call patient three times for telephonic AWV. Called at 1315, 1320, and 1330. Voicemail was full and unable to leave a message

## 2021-01-12 ENCOUNTER — Telehealth: Payer: Self-pay

## 2021-01-12 ENCOUNTER — Ambulatory Visit: Payer: Medicare HMO

## 2021-01-12 NOTE — Telephone Encounter (Signed)
This nurse called patient in order to perform scheduled telephonic AWV. Patient states that he is currently driving and can not do visit. Rescheduled for 02/02/2021.

## 2021-01-14 ENCOUNTER — Ambulatory Visit
Admission: EM | Admit: 2021-01-14 | Discharge: 2021-01-14 | Disposition: A | Discharge status: Home / Self Care | Payer: Medicare HMO | Attending: Emergency Medicine | Admitting: Emergency Medicine

## 2021-01-14 ENCOUNTER — Other Ambulatory Visit: Payer: Self-pay

## 2021-01-14 ENCOUNTER — Encounter: Payer: Self-pay | Admitting: Emergency Medicine

## 2021-01-14 DIAGNOSIS — H6122 Impacted cerumen, left ear: Secondary | ICD-10-CM | POA: Diagnosis not present

## 2021-01-14 MED ORDER — NEOMYCIN-POLYMYXIN-HC 3.5-10000-1 OT SUSP: 4.0000 [drp] | Freq: Two times a day (BID) | OTIC | 0 refills | Status: AC

## 2021-01-14 NOTE — Discharge Instructions (Addendum)
4 drops the Cortisporin otic in your left ear twice daily for treatment of your left ear irritation and swelling.  If you have any increase in pain, new changes to hearing, fever, or dizziness please return for reevaluation.

## 2021-01-14 NOTE — ED Triage Notes (Signed)
Pt presents today with c/o of left ear fullness x 4 days. Denies fever.
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
## 2021-02-02 ENCOUNTER — Ambulatory Visit (INDEPENDENT_AMBULATORY_CARE_PROVIDER_SITE_OTHER): Payer: Medicare HMO

## 2021-02-02 VITALS — Ht 69.0 in | Wt 160.0 lb

## 2021-02-02 DIAGNOSIS — Z0000 Encounter for general adult medical examination without abnormal findings: Secondary | ICD-10-CM

## 2021-02-02 NOTE — Patient Instructions (Signed)
Thomas [name] , Thank you for taking time to come for your Medicare Wellness Visit. I appreciate your ongoing commitment to your health goals. Please review the following plan we discussed and let me know if I can assist you in the future.   Screening recommendations/referrals: Colonoscopy: not required Recommended yearly ophthalmology/optometry visit for glaucoma screening and checkup Recommended yearly dental visit for hygiene and checkup  Vaccinations: Influenza vaccine: due Pneumococcal vaccine: completed 05/31/2016 Tdap vaccine: completed 06/14/2011 Shingles vaccine: discussed   Covid-19: 06/21/2019, 07/12/2019, 02/21/2020  Advanced directives: Please bring a copy of your POA (Power of Attorney) and/or Living Will to your next appointment.   Conditions/risks identified: none  Next appointment: Follow up in one year for your annual wellness visit.   Preventive Care 77 Years and Older, Male Preventive care refers to lifestyle choices and visits with your health care provider that can promote health and wellness. What does preventive care include? A yearly physical exam. This is also called an annual well check. Dental exams once or twice a year. Routine eye exams. Ask your health care provider how often you should have your eyes checked. Personal lifestyle choices, including: Daily care of your teeth and gums. Regular physical activity. Eating a healthy diet. Avoiding tobacco and drug use. Limiting alcohol use. Practicing safe sex. Taking low doses of aspirin every day. Taking vitamin and mineral supplements as recommended by your health care provider. What happens during an annual well check? The services and screenings done by your health care provider during your annual well check will depend on your age, overall health, lifestyle risk factors, and family history of disease. Counseling  Your health care provider may ask you questions about your: Alcohol use. Tobacco use. Drug  use. Emotional well-being. Home and relationship well-being. Sexual activity. Eating habits. History of falls. Memory and ability to understand (cognition). Work and work Astronomer. Screening  You may have the following tests or measurements: Height, weight, and BMI. Blood pressure. Lipid and cholesterol levels. These may be checked every 5 years, or more frequently if you are over 77 years old. Skin check. Lung cancer screening. You may have this screening every year starting at age 77 if you have a 30-pack-year history of smoking and currently smoke or have quit within the past 15 years. Fecal occult blood test (FOBT) of the stool. You may have this test every year starting at age 77. Flexible sigmoidoscopy or colonoscopy. You may have a sigmoidoscopy every 5 years or a colonoscopy every 10 years starting at age 77. Prostate cancer screening. Recommendations will vary depending on your family history and other risks. Hepatitis C blood test. Hepatitis B blood test. Sexually transmitted disease (STD) testing. Diabetes screening. This is done by checking your blood sugar (glucose) after you have not eaten for a while (fasting). You may have this done every 1-3 years. Abdominal aortic aneurysm (AAA) screening. You may need this if you are a current or former smoker. Osteoporosis. You may be screened starting at age 77 if you are at high risk. Talk with your health care provider about your test results, treatment options, and if necessary, the need for more tests. Vaccines  Your health care provider may recommend certain vaccines, such as: Influenza vaccine. This is recommended every year. Tetanus, diphtheria, and acellular pertussis (Tdap, Td) vaccine. You may need a Td booster every 10 years. Zoster vaccine. You may need this after age 77. Pneumococcal 13-valent conjugate (PCV13) vaccine. One dose is recommended after age 77. Pneumococcal  polysaccharide (PPSV23) vaccine. One dose is  recommended after age 77. Talk to your health care provider about which screenings and vaccines you need and how often you need them. This information is not intended to replace advice given to you by your health care provider. Make sure you discuss any questions you have with your health care provider. Document Released: 05/15/2015 Document Revised: 01/06/2016 Document Reviewed: 02/17/2015 Elsevier Interactive Patient Education  2017 [place] Prevention in the Home Falls can cause injuries. They can happen to people of all ages. There are many things you can do to make your home safe and to help prevent falls. What can I do on the outside of my home? Regularly fix the edges of walkways and driveways and fix any cracks. Remove anything that might make you trip as you walk through a door, such as a raised step or threshold. Trim any bushes or trees on the path to your home. Use bright outdoor lighting. Clear any walking paths of anything that might make someone trip, such as rocks or tools. Regularly check to see if handrails are loose or broken. Make sure that both sides of any steps have handrails. Any raised decks and porches should have guardrails on the edges. Have any leaves, snow, or ice cleared regularly. Use sand or salt on walking paths during winter. Clean up any spills in your garage right away. This includes oil or grease spills. What can I do in the bathroom? Use night lights. Install grab bars by the toilet and in the tub and shower. Do not use towel bars as grab bars. Use non-skid mats or decals in the tub or shower. If you need to sit down in the shower, use a plastic, non-slip stool. Keep the floor dry. Clean up any water that spills on the floor as soon as it happens. Remove soap buildup in the tub or shower regularly. Attach bath mats securely with double-sided non-slip rug tape. Do not have throw rugs and other things on the floor that can make you  trip. What can I do in the bedroom? Use night lights. Make sure that you have a light by your bed that is easy to reach. Do not use any sheets or blankets that are too big for your bed. They should not hang down onto the floor. Have a firm chair that has side arms. You can use this for support while you get dressed. Do not have throw rugs and other things on the floor that can make you trip. What can I do in the kitchen? Clean up any spills right away. Avoid walking on wet floors. Keep items that you use a lot in easy-to-reach places. If you need to reach something above you, use a strong step stool that has a grab bar. Keep electrical cords out of the way. Do not use floor polish or wax that makes floors slippery. If you must use wax, use non-skid floor wax. Do not have throw rugs and other things on the floor that can make you trip. What can I do with my stairs? Do not leave any items on the stairs. Make sure that there are handrails on both sides of the stairs and use them. Fix handrails that are broken or loose. Make sure that handrails are as long as the stairways. Check any carpeting to make sure that it is firmly attached to the stairs. Fix any carpet that is loose or worn. Avoid having throw rugs at the top  or bottom of the stairs. If you do have throw rugs, attach them to the floor with carpet tape. Make sure that you have a light switch at the top of the stairs and the bottom of the stairs. If you do not have them, ask someone to add them for you. What else can I do to help prevent falls? Wear shoes that: Do not have high heels. Have rubber bottoms. Are comfortable and fit you well. Are closed at the toe. Do not wear sandals. If you use a stepladder: Make sure that it is fully opened. Do not climb a closed stepladder. Make sure that both sides of the stepladder are locked into place. Ask someone to hold it for you, if possible. Clearly mark and make sure that you can  see: Any grab bars or handrails. First and last steps. Where the edge of each step is. Use tools that help you move around (mobility aids) if they are needed. These include: Canes. Walkers. Scooters. Crutches. Turn on the lights when you go into a dark area. Replace any light bulbs as soon as they burn out. Set up your furniture so you have a clear path. Avoid moving your furniture around. If any of your floors are uneven, fix them. If there are any pets around you, be aware of where they are. Review your medicines with your doctor. Some medicines can make you feel dizzy. This can increase your chance of falling. Ask your doctor what other things that you can do to help prevent falls. This information is not intended to replace advice given to you by your health care provider. Make sure you discuss any questions you have with your health care provider. Document Released: 02/12/2009 Document Revised: 09/24/2015 Document Reviewed: 05/23/2014 Elsevier Interactive Patient Education  2017 [employer].
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
## 2021-02-15 ENCOUNTER — Ambulatory Visit (INDEPENDENT_AMBULATORY_CARE_PROVIDER_SITE_OTHER): Payer: Medicare HMO | Admitting: Family Medicine

## 2021-02-15 ENCOUNTER — Other Ambulatory Visit: Payer: Self-pay

## 2021-02-15 ENCOUNTER — Encounter: Payer: Self-pay | Admitting: Family Medicine

## 2021-02-15 VITALS — BP 139/67 | HR 56 | Ht 69.0 in | Wt 168.2 lb

## 2021-02-15 DIAGNOSIS — I10 Essential (primary) hypertension: Secondary | ICD-10-CM

## 2021-02-15 DIAGNOSIS — Z0000 Encounter for general adult medical examination without abnormal findings: Secondary | ICD-10-CM

## 2021-02-15 DIAGNOSIS — Z23 Encounter for immunization: Secondary | ICD-10-CM

## 2021-02-15 DIAGNOSIS — E782 Mixed hyperlipidemia: Secondary | ICD-10-CM

## 2021-02-15 DIAGNOSIS — R7309 Other abnormal glucose: Secondary | ICD-10-CM

## 2021-02-15 DIAGNOSIS — I480 Paroxysmal atrial fibrillation: Secondary | ICD-10-CM | POA: Diagnosis not present

## 2021-02-15 DIAGNOSIS — N400 Enlarged prostate without lower urinary tract symptoms: Secondary | ICD-10-CM | POA: Diagnosis not present

## 2021-02-15 NOTE — Patient Instructions (Addendum)
Thank you for coming to the office today.  Call us if need a re order on Tramadol for joint pain as needed. If you can use Tylenol instead that is fine as well.  Recommend to start taking Tylenol Extra Strength 500mg  tabs - take 1 to 2 tabs per dose (max 1000mg ) every 6-8 hours for pain, max 24 hour daily dose is 6 tablets or 3000mg . In the future you can repeat the same everyday Tylenol course for 1-2 weeks at a time.   Labs today, call in 1 week if not heard back results.   Please schedule a Follow-up Appointment to: Return in about 6 months (around 08/16/2021) for 6 month follow-up HTN, OA joint pain, updates.  If you have any other questions or concerns, please feel free to call the office or send a message through MyChart. You may also schedule an earlier appointment if necessary.  Additionally, you may be receiving a survey about your experience at our office within a few days to 1 week by e-mail or mail. We value your feedback.  , DO [facility], 08/18/2021

## 2021-02-15 NOTE — Assessment & Plan Note (Signed)
Controlled on current supplement Saw Palmetto Followed by Urology 

## 2021-02-15 NOTE — Assessment & Plan Note (Signed)
Mildly elevated initial BP - Home BP readings reviewed  Paroxysmal AFib. S/p ablation   Plan:  1. Continue current BP regimen HCTZ 25mg  daily, Atenolol 25mg  daily - Note Diltiazem is only PRN if HR elevated 2. Encourage improved lifestyle - low sodium diet, regular exercise 3. Continue monitor BP outside office, bring readings to next visit, if persistently >140/90 or new symptoms notify office sooner

## 2021-02-15 NOTE — Assessment & Plan Note (Signed)
Previously controlled Check Lipid today  Plan: 1. Continue current meds - Atorvastatin 20mg  daily  Encourage improved lifestyle - low carb/cholesterol, reduce portion size, continue improving regular exercise

## 2021-02-15 NOTE — Assessment & Plan Note (Signed)
Controlled, without atrial fibrillation currently History of paroxysmal problem Followed by [facility] On Atenolol 25mg daily rate control Has PRN Diltiazem only if need for flare up HR >100 RVR, will re order since last rx has expired 
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
## 2021-02-15 NOTE — Assessment & Plan Note (Signed)
Check A1c. 

## 2021-02-16 LAB — CBC WITH DIFFERENTIAL/PLATELET
Absolute Monocytes: 348 cells/uL (ref 200–950)
Basophils Absolute: 29 cells/uL (ref 0–200)
Basophils Relative: 0.6 %
Eosinophils Absolute: 270 cells/uL (ref 15–500)
Eosinophils Relative: 5.5 %
HCT: 42.1 % (ref 38.5–50.0)
Hemoglobin: 14.3 g/dL (ref 13.2–17.1)
Lymphs Abs: 1240 cells/uL (ref 850–3900)
MCH: 32.8 pg (ref 27.0–33.0)
MCHC: 34 g/dL (ref 32.0–36.0)
MCV: 96.6 fL (ref 80.0–100.0)
MPV: 9.8 fL (ref 7.5–12.5)
Monocytes Relative: 7.1 %
Neutro Abs: 3014 cells/uL (ref 1500–7800)
Neutrophils Relative %: 61.5 %
Platelets: 194 10*3/uL (ref 140–400)
RBC: 4.36 10*6/uL (ref 4.20–5.80)
RDW: 12.8 % (ref 11.0–15.0)
Total Lymphocyte: 25.3 %
WBC: 4.9 10*3/uL (ref 3.8–10.8)

## 2021-02-16 LAB — COMPLETE METABOLIC PANEL WITH GFR
AG Ratio: 1 (calc) (ref 1.0–2.5)
ALT: 17 U/L (ref 9–46)
AST: 20 U/L (ref 10–35)
Albumin: 3.7 g/dL (ref 3.6–5.1)
Alkaline phosphatase (APISO): 44 U/L (ref 35–144)
BUN: 14 mg/dL (ref 7–25)
CO2: 29 mmol/L (ref 20–32)
Calcium: 9.7 mg/dL (ref 8.6–10.3)
Chloride: 99 mmol/L (ref 98–110)
Creat: 0.96 mg/dL (ref 0.70–1.28)
Globulin: 3.8 g/dL (calc) — ABNORMAL HIGH (ref 1.9–3.7)
Glucose, Bld: 101 mg/dL — ABNORMAL HIGH (ref 65–99)
Potassium: 4.8 mmol/L (ref 3.5–5.3)
Sodium: 136 mmol/L (ref 135–146)
Total Bilirubin: 1 mg/dL (ref 0.2–1.2)
Total Protein: 7.5 g/dL (ref 6.1–8.1)
eGFR: 81 mL/min/{1.73_m2} (ref >=60)

## 2021-02-16 LAB — HEMOGLOBIN A1C
Hgb A1c MFr Bld: 5.7 % of total Hgb — ABNORMAL HIGH (ref <5.7)
Mean Plasma Glucose: 117 mg/dL
eAG (mmol/L): 6.5 mmol/L

## 2021-02-16 LAB — LIPID PANEL
Cholesterol: 174 mg/dL (ref <200)
HDL: 37 mg/dL — ABNORMAL LOW (ref >=40)
LDL Cholesterol (Calc): 112 mg/dL (calc) — ABNORMAL HIGH
Non-HDL Cholesterol (Calc): 137 mg/dL (calc) — ABNORMAL HIGH (ref <130)
Total CHOL/HDL Ratio: 4.7 (calc) (ref <5.0)
Triglycerides: 135 mg/dL (ref <150)

## 2021-02-16 LAB — PSA: PSA: 0.42 ng/mL (ref <=4.00)

## 2021-04-09 ENCOUNTER — Telehealth: Payer: Self-pay | Admitting: Cardiovascular Disease

## 2021-04-09 ENCOUNTER — Other Ambulatory Visit: Payer: Self-pay | Admitting: Cardiovascular Disease

## 2021-04-09 NOTE — Telephone Encounter (Signed)
No answer, VM full.... 

## 2021-04-09 NOTE — Telephone Encounter (Signed)
-----  Message from Maple Hudson, RN sent at 04/09/2021  1:25 PM EST ----- Regarding: Needs appt Pt has not been seen since 01/2020 Needs appt with [facility] or APP He has requested medication refills, cannot give additional until seen Thanks [facility]

## 2021-05-28 ENCOUNTER — Other Ambulatory Visit: Payer: Self-pay

## 2021-05-28 ENCOUNTER — Ambulatory Visit: Payer: Medicare HMO | Admitting: Nurse Practitioner

## 2021-05-28 ENCOUNTER — Encounter: Payer: Self-pay | Admitting: Nurse Practitioner

## 2021-05-28 VITALS — BP 120/88 | HR 66 | Ht 69.0 in | Wt 173.0 lb

## 2021-05-28 DIAGNOSIS — I491 Atrial premature depolarization: Secondary | ICD-10-CM

## 2021-05-28 DIAGNOSIS — I493 Ventricular premature depolarization: Secondary | ICD-10-CM | POA: Diagnosis not present

## 2021-05-28 DIAGNOSIS — I480 Paroxysmal atrial fibrillation: Secondary | ICD-10-CM

## 2021-05-28 DIAGNOSIS — E782 Mixed hyperlipidemia: Secondary | ICD-10-CM

## 2021-05-28 DIAGNOSIS — I10 Essential (primary) hypertension: Secondary | ICD-10-CM | POA: Diagnosis not present

## 2021-05-28 MED ORDER — ATENOLOL 25 MG PO TABS: 25.0000 mg | ORAL_TABLET | Freq: Every day | ORAL | 3 refills | Status: DC

## 2021-05-28 MED ORDER — APIXABAN 5 MG PO TABS: 5.0000 mg | ORAL_TABLET | Freq: Two times a day (BID) | ORAL | 3 refills | Status: DC
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
## 2021-05-28 NOTE — Patient Instructions (Signed)
Medication Instructions:  - Your physician recommends that you continue on your current medications as directed. Please refer to the Current Medication list given to you today.  *If you need a refill on your cardiac medications before your next appointment, please call your pharmacy*   Lab Work: - none ordered  If you have labs (blood work) drawn today and your tests are completely normal, you will receive your results only by: [place] (if you have MyChart) OR A paper copy in the mail If you have any lab test that is abnormal or we need to change your treatment, we will call you to review the results.   Testing/Procedures: - none ordered   Follow-Up: At [facility], you and your health needs are our priority.  As part of our continuing mission to provide you with exceptional heart care, we have created designated Provider Care Teams.  These Care Teams include your primary Cardiologist (physician) and Advanced Practice Providers (APPs -  Physician Assistants and Nurse Practitioners) who all work together to provide you with the care you need, when you need it.  We recommend signing up for the patient portal called "MyChart".  Sign up information is provided on this After Visit Summary.  MyChart is used to connect with patients for Virtual Visits (Telemedicine).  Patients are able to view lab/test results, encounter notes, upcoming appointments, etc.  Non-urgent messages can be sent to your provider as well.   To learn more about what you can do with MyChart, go to [email or web address].    Your next appointment:   1 year(s)  The format for your next appointment:   In Person  Provider:   You may see Ida Rogue, MD or one of the following Advanced Practice Providers on your designated Care Team:   Murray Hodgkins, NP    Other Instructions N/a

## 2021-06-11 ENCOUNTER — Other Ambulatory Visit: Payer: Self-pay

## 2021-06-11 ENCOUNTER — Ambulatory Visit (INDEPENDENT_AMBULATORY_CARE_PROVIDER_SITE_OTHER): Payer: Medicare HMO | Admitting: Family Medicine

## 2021-06-11 ENCOUNTER — Telehealth: Payer: Self-pay | Admitting: *Deleted

## 2021-06-11 ENCOUNTER — Encounter: Payer: Self-pay | Admitting: Family Medicine

## 2021-06-11 ENCOUNTER — Other Ambulatory Visit: Payer: Self-pay | Admitting: Nurse Practitioner

## 2021-06-11 ENCOUNTER — Ambulatory Visit (INDEPENDENT_AMBULATORY_CARE_PROVIDER_SITE_OTHER): Payer: Medicare HMO

## 2021-06-11 VITALS — BP 131/75 | HR 57 | Ht 69.0 in | Wt 169.6 lb

## 2021-06-11 DIAGNOSIS — R55 Syncope and collapse: Secondary | ICD-10-CM

## 2021-06-11 DIAGNOSIS — I480 Paroxysmal atrial fibrillation: Secondary | ICD-10-CM

## 2021-06-11 DIAGNOSIS — I10 Essential (primary) hypertension: Secondary | ICD-10-CM | POA: Diagnosis not present

## 2021-06-11 NOTE — Patient Instructions (Addendum)
Thank you for coming to the office today.  Labs today  Will message Ward Givens and get his advice as well  1. As discussed, I do not know the exact cause of your syncopal episodes (passing out), usually we divide this into either concerning or less concerning syncopal episodes. The most common type is Vasovagal Syncope, often described as you did with feeling flushed or sweating, lightheaded or dizzy, usually these are random episodes, triggered by a variety of causes (can be stress, emotional, physical, straining with exercise or bowel movement even, dehydration poor intake). Still a medical concern, but usually more of a benign problem, there is limited treatment or testing to be done for this type of syncope.  The concerning syncope is either caused by Cardiac (Heart) or Neurogenic (Brain), usually provoked by exertional activity, associated with high risk symptoms chest pain, tightness or pressure, shortness of breath, headache, or stroke like symptoms significant facial or arm/leg weakness, numbness, or related to seizure activity - if you develop any of these symptoms seek help immediately at hospital ED.    From now on, be mindful of possible syncopal episodes, I would encourage increase water hydration  If syncopal episode occurs again without any of the above significant red flag symptoms, and it resolves on it's own and you don't feel persistently sick, then you may notify our office, follow-up in our office, or seek treatment at Urgent Care, we can discuss future referrals such as Cardiology and other testing.   Please schedule a Follow-up Appointment to: Return in about 4 months (around 10/09/2021) for 4 month follow-up HTN, cards updates,arthritis ?syncopal episode.  If you have any other questions or concerns, please feel free to call the office or send a message through MyChart. You may also schedule an earlier appointment if necessary.  Additionally, you may be receiving a survey  about your experience at our office within a few days to 1 week by e-mail or mail. We value your feedback.  Saralyn Pilar, DO [facility], [place]

## 2021-06-11 NOTE — Telephone Encounter (Signed)
Reviewed recommendations by provider to wear a zio monitor. Patient states he wore one before and does not wish to wear another one. Provided reassurance and explained the importance of wearing this monitor. He was agreeable for me to send one for him to wear but did sound hesitant. Order entered for it to be shipped to his home and instructed him to please give [place] a call if he should have any questions. He verbalized understanding with no further questions at this time.

## 2021-06-11 NOTE — Telephone Encounter (Signed)
-----   Message from Creig Hines, NP sent at 06/11/2021  4:20 PM EST ----- Thanks for the heads up.  I'm sorry to hear that.  I see he was bradycardic when he saw you and had PACs and PVCs when he saw me.  That it occurred suddenly and while sitting is a little worrisome.  I'll put in an order for a Zio AT (live tele) to see if he's having arrhythmias that might have led to this.  With unexplained syncope, he shouldn't be driving.  I have our nurse copied here Elita Quick, would you mind reaching out to Mr. Leffel to see if he'd be willing to wear a Zio for 14 days, and also let him know that he shouldn't be driving?  I'll put in the order, Pam.  Thanks,  Thayer Ohm ----- Message ----- From: Smitty Cords, DO Sent: 06/11/2021   1:18 PM EST To: Creig Hines, NP  Thayer Ohm, hope this message finds you well. Just sharing this mutual patient note with you, he saw you recently and has been doing well. Now we have a new sudden onset syncopal episode. Seems suspicious for Vasovagal episode but not a whole lot of other factors or provoking symptoms. No further issues - so seems to be a one off episode. Doing basic labs to check electrolytes. Just wanted to keep you in the loop. Any suggestions - I gave him return precautions for now. No changes on my end. Thanks!

## 2021-06-11 NOTE — Progress Notes (Signed)
Subjective:    Patient ID: Thomas [name], male    DOB: 10/24/1945, 76 y.o.   MRN: [id number]  Thomas [name] is a 76 y.o. male presenting on 06/11/2021 for Loss of Consciousness and Fatigue   HPI  Syncopal Episode Recent history, this past weekend Saturday 2/4 he felt "lousy" did not feel great, next day he went to church on Sun 2/5, he was sitting there and felt sudden feeling of nearly going to pass out. He did not stand up quickly or anything. He did not have much to eat that morning had coffee and part of banana. He did pass out and fell and his glasses broke as he fell. EMS was called and they evaluated him and he had normal vitals, and they gave him option to go to ED but it was not required.  Since that episode he has felt well without any further episodes of lightheadedness, dizziness.  He did not feel any nausea sweating heart racing palpitations or warmth flushing or anything in the moment before passing out  Last seen by Ignacia Bayley NP at Cardiology on 05/28/21, no medication changes.  Denies any chest pain pressure, shortness of breath, nausea vomiting.   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 Home BP reading normal Current Meds - HCTZ $Remo'25mg'NRYqe$  daily, Atenolol $RemoveBeforeD'25mg'PnzbHxLFMPiqWn$  daily   Reports good compliance, took meds today. Tolerating well, w/o complaints. Denies CP, dyspnea, HA, edema, dizziness / lightheadedness   BPH LUTS He has managed this with dietary and fluid intake and taking Saw Palmetto. Has followed [facility].    Depression screen [facility] 2/9 06/11/2021 02/15/2021 02/02/2021  Decreased Interest 0 0 0  Down, Depressed, Hopeless 0 0 0  PHQ - 2 Score 0 0 0  Altered sleeping 0 0 -  Tired, decreased energy 1 0 -   Change in appetite 0 0 -  Feeling bad or failure about yourself  0 0 -  Trouble concentrating 0 0 -  Moving slowly or fidgety/restless 0 0 -  Suicidal thoughts 0 0 -  PHQ-9 Score 1 0 -  Difficult doing work/chores Not difficult at all Not difficult at all -    Social History   Tobacco Use   Smoking status: Former    Types: Cigarettes    Quit date: 05/02/1970    Years since quitting: 51.1   Smokeless tobacco: Never  Vaping Use   Vaping Use: Never used  Substance Use Topics   Alcohol use: No   Drug use: No    Review of Systems Per HPI unless specifically indicated above     Objective:    BP 131/75    Pulse (!) 57    Ht $R'5\' 9"'mB$  (1.753 m)    Wt 169 lb 9.6 oz (76.9 kg)    SpO2 96%    BMI 25.05 kg/m   Wt Readings from Last 3 Encounters:  06/11/21 169 lb 9.6 oz (76.9 kg)  05/28/21 173 lb (78.5 kg)  02/15/21 168 lb 3.2 oz (76.3 kg)    Physical Exam Vitals and nursing note reviewed.  Constitutional:      General: He is not in acute distress.    Appearance: He is well-developed. He is not diaphoretic.  Comments: Well-appearing, comfortable, cooperative  HENT:     Head: Normocephalic and atraumatic.  Eyes:     General:        Right eye: No discharge.        Left eye: No discharge.     Conjunctiva/sclera: Conjunctivae normal.  Neck:     Thyroid: No thyromegaly.  Cardiovascular:     Rate and Rhythm: Normal rate and regular rhythm.     Pulses: Normal pulses.     Heart sounds: Normal heart sounds. No murmur heard. Pulmonary:     Effort: Pulmonary effort is normal. No respiratory distress.     Breath sounds: Normal breath sounds. No wheezing or rales.  Musculoskeletal:        General: Normal range of motion.     Cervical back: Normal range of motion and neck supple.  Lymphadenopathy:     Cervical: No cervical adenopathy.  Skin:    General: Skin is warm and dry.     Findings: No erythema or rash.  Neurological:     Mental Status: He is alert and oriented to person,  place, and time. Mental status is at baseline.  Psychiatric:        Behavior: Behavior normal.     Comments: Well groomed, good eye contact, normal speech and thoughts      Results for orders placed or performed in visit on 02/15/21  COMPLETE METABOLIC PANEL WITH GFR  Result Value Ref Range   Glucose, Bld 101 (H) 65 - 99 mg/dL   BUN 14 7 - 25 mg/dL   Creat 8.91 0.19 - 1.99 mg/dL   eGFR 81 > OR = 60 NQ/BJQ/7.93G8   BUN/Creatinine Ratio NOT APPLICABLE 6 - 22 (calc)   Sodium 136 135 - 146 mmol/L   Potassium 4.8 3.5 - 5.3 mmol/L   Chloride 99 98 - 110 mmol/L   CO2 29 20 - 32 mmol/L   Calcium 9.7 8.6 - 10.3 mg/dL   Total Protein 7.5 6.1 - 8.1 g/dL   Albumin 3.7 3.6 - 5.1 g/dL   Globulin 3.8 (H) 1.9 - 3.7 g/dL (calc)   AG Ratio 1.0 1.0 - 2.5 (calc)   Total Bilirubin 1.0 0.2 - 1.2 mg/dL   Alkaline phosphatase (APISO) 44 35 - 144 U/L   AST 20 10 - 35 U/L   ALT 17 9 - 46 U/L  Lipid panel  Result Value Ref Range   Cholesterol 174 <200 mg/dL   HDL 37 (L) > OR = 40 mg/dL   Triglycerides 286 <137 mg/dL   LDL Cholesterol (Calc) 112 (H) mg/dL (calc)   Total CHOL/HDL Ratio 4.7 <5.0 (calc)   Non-HDL Cholesterol (Calc) 137 (H) <130 mg/dL (calc)  CBC with Differential/Platelet  Result Value Ref Range   WBC 4.9 3.8 - 10.8 Thousand/uL   RBC 4.36 4.20 - 5.80 Million/uL   Hemoglobin 14.3 13.2 - 17.1 g/dL   HCT 12.4 42.5 - 00.2 %   MCV 96.6 80.0 - 100.0 fL   MCH 32.8 27.0 - 33.0 pg   MCHC 34.0 32.0 - 36.0 g/dL   RDW 00.6 16.2 - 81.7 %   Platelets 194 140 - 400 Thousand/uL   MPV 9.8 7.5 - 12.5 fL   Neutro Abs 3,014 1,500 - 7,800 cells/uL   Lymphs Abs 1,240 850 - 3,900 cells/uL   Absolute Monocytes 348 200 - 950 cells/uL   Eosinophils Absolute 270 15 - 500 cells/uL   Basophils Absolute 29 0 - 200 cells/uL  Neutrophils Relative % 61.5 %   Total Lymphocyte 25.3 %   Monocytes Relative 7.1 %   Eosinophils Relative 5.5 %   Basophils Relative 0.6 %  Hemoglobin A1c  Result Value Ref Range    Hgb A1c MFr Bld 5.7 (H) <5.7 % of total Hgb   Mean Plasma Glucose 117 mg/dL   eAG (mmol/L) 6.5 mmol/L  PSA  Result Value Ref Range   PSA 0.42 < OR = 4.00 ng/mL      Assessment & Plan:   Problem List Items Addressed This Visit     Paroxysmal atrial fibrillation (HCC)   Relevant Orders   BASIC METABOLIC PANEL WITH GFR   CBC with Differential/Platelet   Essential hypertension   Relevant Orders   BASIC METABOLIC PANEL WITH GFR   CBC with Differential/Platelet   Other Visit Diagnoses     Syncope, unspecified syncope type    -  Primary   Relevant Orders   BASIC METABOLIC PANEL WITH GFR   CBC with Differential/Platelet       Paroxysmal AFib, currently without active AFib or irregularity  Syncopal episode - resolved No recurrence  Suspected most likely vasovagal syncopal episode given history no significant red flag symptoms associated with episodes to suggest a more serious possible cardiac or neurologic etiology.  Maybe recent poor PO intake but that has improved  Plan: 1. Reassurance, counseled on vasovagal syncope 2. Improve hydration nutrition  Check labs today BMET + CBC  3. Educated on plan if recurrent symptoms, if any concerning features involving chest pain / dyspnea or other acute symptoms, or any exertional features, advised to return to ED immediately. If similar episode without any red flag symptoms, brief and self limited, should advise our office immediately can seek care in office or Urgent Care, otherwise follow-up. If recurrence soon would consider referral to Cardiology for further work-up and discussion, likely benefit from ECHO  Advised that I will reach out to his Cardiologist office and get further input / make them aware.    Orders Placed This Encounter  Procedures   BASIC METABOLIC PANEL WITH GFR   CBC with Differential/Platelet     No orders of the defined types were placed in this encounter.     Follow up plan: Return in about 4  months (around 10/09/2021) for 4 month follow-up HTN, cards updates,arthritis ?syncopal episode.  Forward chart to Ignacia Bayley NP follow up, if need from Syncope   Nobie Putnam, [facility] 06/11/2021, 9:27 AM

## 2021-06-12 LAB — BASIC METABOLIC PANEL WITH GFR
BUN: 16 mg/dL (ref 7–25)
CO2: 24 mmol/L (ref 20–32)
Calcium: 9.4 mg/dL (ref 8.6–10.3)
Chloride: 99 mmol/L (ref 98–110)
Creat: 0.94 mg/dL (ref 0.70–1.28)
Glucose, Bld: 100 mg/dL — ABNORMAL HIGH (ref 65–99)
Potassium: 4.6 mmol/L (ref 3.5–5.3)
Sodium: 134 mmol/L — ABNORMAL LOW (ref 135–146)
eGFR: 83 mL/min/{1.73_m2} (ref >=60)

## 2021-06-12 LAB — CBC WITH DIFFERENTIAL/PLATELET
Absolute Monocytes: 348 cells/uL (ref 200–950)
Basophils Absolute: 21 cells/uL (ref 0–200)
Basophils Relative: 0.4 %
Eosinophils Absolute: 250 cells/uL (ref 15–500)
Eosinophils Relative: 4.8 %
HCT: 42.7 % (ref 38.5–50.0)
Hemoglobin: 14.5 g/dL (ref 13.2–17.1)
Lymphs Abs: 1711 cells/uL (ref 850–3900)
MCH: 32.4 pg (ref 27.0–33.0)
MCHC: 34 g/dL (ref 32.0–36.0)
MCV: 95.3 fL (ref 80.0–100.0)
MPV: 9.8 fL (ref 7.5–12.5)
Monocytes Relative: 6.7 %
Neutro Abs: 2870 cells/uL (ref 1500–7800)
Neutrophils Relative %: 55.2 %
Platelets: 212 10*3/uL (ref 140–400)
RBC: 4.48 10*6/uL (ref 4.20–5.80)
RDW: 12.9 % (ref 11.0–15.0)
Total Lymphocyte: 32.9 %
WBC: 5.2 10*3/uL (ref 3.8–10.8)

## 2021-06-15 DIAGNOSIS — R55 Syncope and collapse: Secondary | ICD-10-CM | POA: Diagnosis not present

## 2021-06-15 DIAGNOSIS — I480 Paroxysmal atrial fibrillation: Secondary | ICD-10-CM | POA: Diagnosis not present

## 2021-10-11 ENCOUNTER — Encounter: Payer: Self-pay | Admitting: Family Medicine

## 2021-10-11 ENCOUNTER — Ambulatory Visit (INDEPENDENT_AMBULATORY_CARE_PROVIDER_SITE_OTHER): Payer: Medicare HMO | Admitting: Family Medicine

## 2021-10-11 VITALS — BP 138/70 | HR 60 | Ht 69.0 in | Wt 171.6 lb

## 2021-10-11 DIAGNOSIS — I10 Essential (primary) hypertension: Secondary | ICD-10-CM

## 2021-10-11 DIAGNOSIS — R55 Syncope and collapse: Secondary | ICD-10-CM

## 2021-10-11 DIAGNOSIS — I480 Paroxysmal atrial fibrillation: Secondary | ICD-10-CM | POA: Diagnosis not present

## 2021-10-11 NOTE — Progress Notes (Signed)
Subjective:    Patient ID: Thomas [name], male    DOB: [birth year], 76 y.o.   MRN: [id number]  Thomas [name] is a 76 y.o. male presenting on 10/11/2021 for Hypertension   HPI   Syncopal Episode, history - Last visit with me 06/11/21, for same problem, see prior notes for background information. - Interval update with has seen Cardiology and they pursued a Zio patch monitor. See results below, some SVT but otherwise no triggered events. No other concerns and ultimately determined by Cardiology no explanation of his syncopal event. Reassurance, no further treatment - Today patient reports doing well. No further episodes since the last syncopal event. Denies any chest pain pressure, shortness of breath, nausea vomiting.     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 Home BP reading normal Current Meds - HCTZ $Remo'25mg'ylMsU$  daily, Atenolol $RemoveBeforeD'25mg'qbdGZWduKnwSoe$  daily   Reports good compliance, took meds today. Tolerating well, w/o complaints. Denies CP, dyspnea, HA, edema, dizziness / lightheadedness   BPH LUTS He has managed this with dietary and fluid intake and taking Saw Palmetto. Has followed [facility].  Skin Lesion Behind Right Ear. He cannot see it but can feel it, present for months, he went to Dr Phillip Heal to remove it via excision. And it has grown back he says. He sees her 2 times per year   Health Maintenance: Future Shingrix if interested, has had shingles and zostavax before.     10/11/2021   10:41 AM 06/11/2021    9:06 AM 02/15/2021    9:02 AM  Depression screen PHQ 2/9  Decreased Interest 0 0 0  Down, Depressed, Hopeless 0 0 0  PHQ - 2 Score 0 0 0  Altered sleeping 0 0 0  Tired, decreased energy 1 1 0  Change in appetite 0 0 0   Feeling bad or failure about yourself  0 0 0  Trouble concentrating 0 0 0  Moving slowly or fidgety/restless 0 0 0  Suicidal thoughts 0 0 0  PHQ-9 Score 1 1 0  Difficult doing work/chores Not difficult at all Not difficult at all Not difficult at all    Social History   Tobacco Use   Smoking status: Former    Types: Cigarettes    Quit date: 05/02/1970    Years since quitting: 51.4   Smokeless tobacco: Never  Vaping Use   Vaping Use: Never used  Substance Use Topics   Alcohol use: No   Drug use: No    Review of Systems Per HPI unless specifically indicated above     Objective:    BP 138/70 (BP Location: Left Arm, Cuff Size: Normal)   Pulse 60   Ht $R'5\' 9"'IG$  (1.753 m)   Wt 171 lb 9.6 oz (77.8 kg)   SpO2 96%   BMI 25.34 kg/m   Wt Readings from Last 3 Encounters:  10/11/21 171 lb 9.6 oz (77.8 kg)  06/11/21 169 lb 9.6 oz (76.9 kg)  05/28/21 173 lb (78.5 kg)    Physical Exam Vitals and nursing note reviewed.  Constitutional:      General: He is not in acute distress.    Appearance: Normal appearance. He is well-developed. He is not diaphoretic.     Comments:  Well-appearing, comfortable, cooperative  HENT:     Head: Normocephalic and atraumatic.  Eyes:     General:        Right eye: No discharge.        Left eye: No discharge.     Conjunctiva/sclera: Conjunctivae normal.  Neck:     Thyroid: No thyromegaly.  Cardiovascular:     Rate and Rhythm: Normal rate and regular rhythm.     Pulses: Normal pulses.     Heart sounds: Normal heart sounds. No murmur heard. Pulmonary:     Effort: Pulmonary effort is normal. No respiratory distress.     Breath sounds: Normal breath sounds. No wheezing or rales.  Musculoskeletal:        General: Normal range of motion.     Cervical back: Normal range of motion and neck supple.  Lymphadenopathy:     Cervical: No cervical adenopathy.  Skin:    General: Skin is warm and dry.     Findings: Lesion (Posterior to R ear with very  slight 1 mm or less rough spot appears to be scar tissue does not really seem like non healing lesion) present. No erythema or rash.  Neurological:     Mental Status: He is alert and oriented to person, place, and time. Mental status is at baseline.  Psychiatric:        Mood and Affect: Mood normal.        Behavior: Behavior normal.        Thought Content: Thought content normal.     Comments: Well groomed, good eye contact, normal speech and thoughts       LONG TERM MONITOR-LIVE TELEMETRY (3-14 DAYS)Performed 07/06/2021 Final result  Study Result Event monitor Patch Wear Time: 13 days and 23 hours (2023-02-14T09:30:57-0500 to 2023-02-28T08:57:17-0500)  Normal sinus rhythm Patient had a min HR of 46 bpm, max HR of 182 bpm, and avg HR of 63 bpm.  Bundle Branch Block/IVCD was present.  7 Supraventricular Tachycardia runs occurred, the run with the fastest interval lasting 6 beats with a max rate of 182 bpm, the longest lasting 9 beats with an avg rate of 126 bpm.  Isolated SVEs were rare (<1.0%), SVE Couplets were rare (<1.0%), and SVE Triplets were rare (<1.0%). Isolated VEs were occasional  (1.5%, 18751), VE Couplets were rare (<1.0%, 661), and VE Triplets were rare (<1.0%, 7). Ventricular Bigeminy and Trigeminy were present.  No patient triggered events recorded   Results for orders placed or performed in visit on 50/27/74  BASIC METABOLIC PANEL WITH GFR  Result Value Ref Range   Glucose, Bld 100 (H) 65 - 99 mg/dL   BUN 16 7 - 25 mg/dL   Creat 0.94 0.70 - 1.28 mg/dL   eGFR 83 > OR = 60 mL/min/1.96m2   BUN/Creatinine Ratio NOT APPLICABLE 6 - 22 (calc)   Sodium 134 (L) 135 - 146 mmol/L   Potassium 4.6 3.5 - 5.3 mmol/L   Chloride 99 98 - 110 mmol/L   CO2 24 20 - 32 mmol/L   Calcium 9.4 8.6 - 10.3 mg/dL  CBC with Differential/Platelet  Result Value Ref Range   WBC 5.2 3.8 - 10.8 Thousand/uL   RBC 4.48 4.20 - 5.80 Million/uL   Hemoglobin 14.5 13.2 - 17.1 g/dL   HCT 42.7  38.5 - 50.0 %   MCV 95.3 80.0 - 100.0 fL   MCH 32.4 27.0 - 33.0 pg   MCHC 34.0 32.0 - 36.0 g/dL   RDW 12.9 11.0 -  15.0 %   Platelets 212 140 - 400 Thousand/uL   MPV 9.8 7.5 - 12.5 fL   Neutro Abs 2,870 1,500 - 7,800 cells/uL   Lymphs Abs 1,711 850 - 3,900 cells/uL   Absolute Monocytes 348 200 - 950 cells/uL   Eosinophils Absolute 250 15 - 500 cells/uL   Basophils Absolute 21 0 - 200 cells/uL   Neutrophils Relative % 55.2 %   Total Lymphocyte 32.9 %   Monocytes Relative 6.7 %   Eosinophils Relative 4.8 %   Basophils Relative 0.4 %      Assessment & Plan:   Problem List Items Addressed This Visit     Paroxysmal atrial fibrillation ([place])   Essential hypertension   Other Visit Diagnoses     Syncope, unspecified syncope type    -  Primary       Reassurance No further syncope Followed by Cardiology, event monitor negative for cause of syncope. Continue on Atenolol and Eliquis  HTN controlled on current therapy  Skin lesion, vs scar tissue behind ear not resolved. May return to Dermatology at next apt.  No orders of the defined types were placed in this encounter.     Follow up plan: Return in about 4 months (around 02/10/2022) for 4 month fasting lab only then 1 week later Annual Physical.  Future labs ordered for 01/2022   Nobie Putnam, [facility] 10/11/2021, 8:51 AM

## 2021-10-11 NOTE — Patient Instructions (Addendum)
Thank you for coming to the office today.  Shingles vaccine when ready at pharmacy 2 doses, 2-6 months apart, can go to pharmacy to get shot, covered by medicare.  Ask Dr Cheree Ditto about the spot behind R ear next time, I think it looks like some scar tissue. Likely due to the excision.  DUE for FASTING BLOOD WORK (no food or drink after midnight before the lab appointment, only water or coffee without cream/sugar on the morning of)  SCHEDULE "Lab Only" visit in the morning at the clinic for lab draw in 4 MONTHS   - Make sure Lab Only appointment is at about 1 week before your next appointment, so that results will be available  For Lab Results, once available within 2-3 days of blood draw, you can can log in to MyChart online to view your results and a brief explanation. Also, we can discuss results at next follow-up visit.   Please schedule a Follow-up Appointment to: Return in about 4 months (around 02/10/2022) for 4 month fasting lab only then 1 week later Annual Physical.  If you have any other questions or concerns, please feel free to call the office or send a message through MyChart. You may also schedule an earlier appointment if necessary.  Additionally, you may be receiving a survey about your experience at our office within a few days to 1 week by e-mail or mail. We value your feedback.  Thomas Pilar, DO [facility], [place]

## 2021-10-12 ENCOUNTER — Other Ambulatory Visit: Payer: Self-pay | Admitting: Family Medicine

## 2021-10-12 DIAGNOSIS — R7309 Other abnormal glucose: Secondary | ICD-10-CM

## 2021-10-12 DIAGNOSIS — I10 Essential (primary) hypertension: Secondary | ICD-10-CM

## 2021-10-12 DIAGNOSIS — I480 Paroxysmal atrial fibrillation: Secondary | ICD-10-CM

## 2021-10-12 DIAGNOSIS — N400 Enlarged prostate without lower urinary tract symptoms: Secondary | ICD-10-CM

## 2021-10-12 DIAGNOSIS — Z0000 Encounter for general adult medical examination without abnormal findings: Secondary | ICD-10-CM

## 2021-10-12 DIAGNOSIS — E782 Mixed hyperlipidemia: Secondary | ICD-10-CM

## 2022-01-11 ENCOUNTER — Ambulatory Visit: Payer: Medicare HMO

## 2022-01-18 ENCOUNTER — Ambulatory Visit: Payer: Medicare HMO

## 2022-02-08 ENCOUNTER — Ambulatory Visit: Payer: Medicare HMO

## 2022-02-09 ENCOUNTER — Other Ambulatory Visit: Payer: Medicare HMO

## 2022-02-11 ENCOUNTER — Ambulatory Visit (INDEPENDENT_AMBULATORY_CARE_PROVIDER_SITE_OTHER): Payer: Medicare HMO

## 2022-02-11 VITALS — Ht 69.0 in | Wt 168.0 lb

## 2022-02-11 DIAGNOSIS — Z0000 Encounter for general adult medical examination without abnormal findings: Secondary | ICD-10-CM | POA: Diagnosis not present

## 2022-02-11 NOTE — Patient Instructions (Signed)
Thomas [name] , Thank you for taking time to come for your Medicare Wellness Visit. I appreciate your ongoing commitment to your health goals. Please review the following plan we discussed and let me know if I can assist you in the future.   Screening recommendations/referrals: Colonoscopy: not required Recommended yearly ophthalmology/optometry visit for glaucoma screening and checkup Recommended yearly dental visit for hygiene and checkup  Vaccinations: Influenza vaccine: due Pneumococcal vaccine: completed 05/31/2016 Tdap vaccine: due Shingles vaccine: discussed   Covid-19:  02/21/2020, 07/12/2019, 06/21/2019  Advanced directives: Please bring a copy of your POA (Power of Attorney) and/or Living Will to your next appointment.   Conditions/risks identified: none  Next appointment: Follow up in one year for your annual wellness visit.   Preventive Care 78 Years and Older, Male Preventive care refers to lifestyle choices and visits with your health care provider that can promote health and wellness. What does preventive care include? A yearly physical exam. This is also called an annual well check. Dental exams once or twice a year. Routine eye exams. Ask your health care provider how often you should have your eyes checked. Personal lifestyle choices, including: Daily care of your teeth and gums. Regular physical activity. Eating a healthy diet. Avoiding tobacco and drug use. Limiting alcohol use. Practicing safe sex. Taking low doses of aspirin every day. Taking vitamin and mineral supplements as recommended by your health care provider. What happens during an annual well check? The services and screenings done by your health care provider during your annual well check will depend on your age, overall health, lifestyle risk factors, and family history of disease. Counseling  Your health care provider may ask you questions about your: Alcohol use. Tobacco use. Drug use. Emotional  well-being. Home and relationship well-being. Sexual activity. Eating habits. History of falls. Memory and ability to understand (cognition). Work and work Statistician. Screening  You may have the following tests or measurements: Height, weight, and BMI. Blood pressure. Lipid and cholesterol levels. These may be checked every 5 years, or more frequently if you are over 54 years old. Skin check. Lung cancer screening. You may have this screening every year starting at age 36 if you have a 30-pack-year history of smoking and currently smoke or have quit within the past 15 years. Fecal occult blood test (FOBT) of the stool. You may have this test every year starting at age 27. Flexible sigmoidoscopy or colonoscopy. You may have a sigmoidoscopy every 5 years or a colonoscopy every 10 years starting at age 78. Prostate cancer screening. Recommendations will vary depending on your family history and other risks. Hepatitis C blood test. Hepatitis B blood test. Sexually transmitted disease (STD) testing. Diabetes screening. This is done by checking your blood sugar (glucose) after you have not eaten for a while (fasting). You may have this done every 1-3 years. Abdominal aortic aneurysm (AAA) screening. You may need this if you are a current or former smoker. Osteoporosis. You may be screened starting at age 59 if you are at high risk. Talk with your health care provider about your test results, treatment options, and if necessary, the need for more tests. Vaccines  Your health care provider may recommend certain vaccines, such as: Influenza vaccine. This is recommended every year. Tetanus, diphtheria, and acellular pertussis (Tdap, Td) vaccine. You may need a Td booster every 10 years. Zoster vaccine. You may need this after age 40. Pneumococcal 13-valent conjugate (PCV13) vaccine. One dose is recommended after age 28. Pneumococcal  polysaccharide (PPSV23) vaccine. One dose is recommended after  age 40. Talk to your health care provider about which screenings and vaccines you need and how often you need them. This information is not intended to replace advice given to you by your health care provider. Make sure you discuss any questions you have with your health care provider. Document Released: 05/15/2015 Document Revised: 01/06/2016 Document Reviewed: 02/17/2015 Elsevier Interactive Patient Education  2017 [place] Prevention in the Home Falls can cause injuries. They can happen to people of all ages. There are many things you can do to make your home safe and to help prevent falls. What can I do on the outside of my home? Regularly fix the edges of walkways and driveways and fix any cracks. Remove anything that might make you trip as you walk through a door, such as a raised step or threshold. Trim any bushes or trees on the path to your home. Use bright outdoor lighting. Clear any walking paths of anything that might make someone trip, such as rocks or tools. Regularly check to see if handrails are loose or broken. Make sure that both sides of any steps have handrails. Any raised decks and porches should have guardrails on the edges. Have any leaves, snow, or ice cleared regularly. Use sand or salt on walking paths during winter. Clean up any spills in your garage right away. This includes oil or grease spills. What can I do in the bathroom? Use night lights. Install grab bars by the toilet and in the tub and shower. Do not use towel bars as grab bars. Use non-skid mats or decals in the tub or shower. If you need to sit down in the shower, use a plastic, non-slip stool. Keep the floor dry. Clean up any water that spills on the floor as soon as it happens. Remove soap buildup in the tub or shower regularly. Attach bath mats securely with double-sided non-slip rug tape. Do not have throw rugs and other things on the floor that can make you trip. What can I do in the  bedroom? Use night lights. Make sure that you have a light by your bed that is easy to reach. Do not use any sheets or blankets that are too big for your bed. They should not hang down onto the floor. Have a firm chair that has side arms. You can use this for support while you get dressed. Do not have throw rugs and other things on the floor that can make you trip. What can I do in the kitchen? Clean up any spills right away. Avoid walking on wet floors. Keep items that you use a lot in easy-to-reach places. If you need to reach something above you, use a strong step stool that has a grab bar. Keep electrical cords out of the way. Do not use floor polish or wax that makes floors slippery. If you must use wax, use non-skid floor wax. Do not have throw rugs and other things on the floor that can make you trip. What can I do with my stairs? Do not leave any items on the stairs. Make sure that there are handrails on both sides of the stairs and use them. Fix handrails that are broken or loose. Make sure that handrails are as long as the stairways. Check any carpeting to make sure that it is firmly attached to the stairs. Fix any carpet that is loose or worn. Avoid having throw rugs at the top  or bottom of the stairs. If you do have throw rugs, attach them to the floor with carpet tape. Make sure that you have a light switch at the top of the stairs and the bottom of the stairs. If you do not have them, ask someone to add them for you. What else can I do to help prevent falls? Wear shoes that: Do not have high heels. Have rubber bottoms. Are comfortable and fit you well. Are closed at the toe. Do not wear sandals. If you use a stepladder: Make sure that it is fully opened. Do not climb a closed stepladder. Make sure that both sides of the stepladder are locked into place. Ask someone to hold it for you, if possible. Clearly mark and make sure that you can see: Any grab bars or  handrails. First and last steps. Where the edge of each step is. Use tools that help you move around (mobility aids) if they are needed. These include: Canes. Walkers. Scooters. Crutches. Turn on the lights when you go into a dark area. Replace any light bulbs as soon as they burn out. Set up your furniture so you have a clear path. Avoid moving your furniture around. If any of your floors are uneven, fix them. If there are any pets around you, be aware of where they are. Review your medicines with your doctor. Some medicines can make you feel dizzy. This can increase your chance of falling. Ask your doctor what other things that you can do to help prevent falls. This information is not intended to replace advice given to you by your health care provider. Make sure you discuss any questions you have with your health care provider. Document Released: 02/12/2009 Document Revised: 09/24/2015 Document Reviewed: 05/23/2014 Elsevier Interactive Patient Education  2017 [employer].
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
## 2022-02-16 ENCOUNTER — Encounter: Payer: Medicare HMO | Admitting: Family Medicine

## 2022-02-28 ENCOUNTER — Other Ambulatory Visit: Payer: Medicare HMO

## 2022-02-28 ENCOUNTER — Other Ambulatory Visit: Payer: Self-pay | Admitting: Nurse Practitioner

## 2022-03-26 ENCOUNTER — Other Ambulatory Visit: Payer: Self-pay | Admitting: Nurse Practitioner

## 2022-03-28 ENCOUNTER — Other Ambulatory Visit: Payer: Self-pay

## 2022-03-28 DIAGNOSIS — Z0000 Encounter for general adult medical examination without abnormal findings: Secondary | ICD-10-CM

## 2022-03-28 DIAGNOSIS — I10 Essential (primary) hypertension: Secondary | ICD-10-CM

## 2022-03-28 DIAGNOSIS — R7309 Other abnormal glucose: Secondary | ICD-10-CM

## 2022-03-28 DIAGNOSIS — I480 Paroxysmal atrial fibrillation: Secondary | ICD-10-CM

## 2022-03-28 DIAGNOSIS — N400 Enlarged prostate without lower urinary tract symptoms: Secondary | ICD-10-CM

## 2022-03-28 DIAGNOSIS — E782 Mixed hyperlipidemia: Secondary | ICD-10-CM

## 2022-03-29 ENCOUNTER — Other Ambulatory Visit: Payer: Medicare HMO

## 2022-03-29 DIAGNOSIS — I480 Paroxysmal atrial fibrillation: Secondary | ICD-10-CM | POA: Diagnosis not present

## 2022-03-29 DIAGNOSIS — I10 Essential (primary) hypertension: Secondary | ICD-10-CM | POA: Diagnosis not present

## 2022-03-29 DIAGNOSIS — Z0000 Encounter for general adult medical examination without abnormal findings: Secondary | ICD-10-CM | POA: Diagnosis not present

## 2022-03-29 DIAGNOSIS — N400 Enlarged prostate without lower urinary tract symptoms: Secondary | ICD-10-CM | POA: Diagnosis not present

## 2022-03-29 DIAGNOSIS — E782 Mixed hyperlipidemia: Secondary | ICD-10-CM | POA: Diagnosis not present

## 2022-03-29 DIAGNOSIS — R7309 Other abnormal glucose: Secondary | ICD-10-CM | POA: Diagnosis not present

## 2022-03-30 LAB — LIPID PANEL
Cholesterol: 179 mg/dL (ref <200)
HDL: 38 mg/dL — ABNORMAL LOW (ref >=40)
LDL Cholesterol (Calc): 114 mg/dL (calc) — ABNORMAL HIGH
Non-HDL Cholesterol (Calc): 141 mg/dL (calc) — ABNORMAL HIGH (ref <130)
Total CHOL/HDL Ratio: 4.7 (calc) (ref <5.0)
Triglycerides: 158 mg/dL — ABNORMAL HIGH (ref <150)

## 2022-03-30 LAB — CBC WITH DIFFERENTIAL/PLATELET
Absolute Monocytes: 313 cells/uL (ref 200–950)
Basophils Absolute: 18 cells/uL (ref 0–200)
Basophils Relative: 0.4 %
Eosinophils Absolute: 239 cells/uL (ref 15–500)
Eosinophils Relative: 5.2 %
HCT: 41.9 % (ref 38.5–50.0)
Hemoglobin: 14.4 g/dL (ref 13.2–17.1)
Lymphs Abs: 1564 cells/uL (ref 850–3900)
MCH: 33 pg (ref 27.0–33.0)
MCHC: 34.4 g/dL (ref 32.0–36.0)
MCV: 95.9 fL (ref 80.0–100.0)
MPV: 9.9 fL (ref 7.5–12.5)
Monocytes Relative: 6.8 %
Neutro Abs: 2466 cells/uL (ref 1500–7800)
Neutrophils Relative %: 53.6 %
Platelets: 214 10*3/uL (ref 140–400)
RBC: 4.37 10*6/uL (ref 4.20–5.80)
RDW: 13.2 % (ref 11.0–15.0)
Total Lymphocyte: 34 %
WBC: 4.6 10*3/uL (ref 3.8–10.8)

## 2022-03-30 LAB — HEMOGLOBIN A1C
Hgb A1c MFr Bld: 6 % of total Hgb — ABNORMAL HIGH (ref <5.7)
Mean Plasma Glucose: 126 mg/dL
eAG (mmol/L): 7 mmol/L

## 2022-03-30 LAB — COMPLETE METABOLIC PANEL WITH GFR
AG Ratio: 0.9 (calc) — ABNORMAL LOW (ref 1.0–2.5)
ALT: 11 U/L (ref 9–46)
AST: 16 U/L (ref 10–35)
Albumin: 3.7 g/dL (ref 3.6–5.1)
Alkaline phosphatase (APISO): 51 U/L (ref 35–144)
BUN: 15 mg/dL (ref 7–25)
CO2: 28 mmol/L (ref 20–32)
Calcium: 9.4 mg/dL (ref 8.6–10.3)
Chloride: 97 mmol/L — ABNORMAL LOW (ref 98–110)
Creat: 0.86 mg/dL (ref 0.70–1.28)
Globulin: 4 g/dL (calc) — ABNORMAL HIGH (ref 1.9–3.7)
Glucose, Bld: 103 mg/dL — ABNORMAL HIGH (ref 65–99)
Potassium: 5 mmol/L (ref 3.5–5.3)
Sodium: 135 mmol/L (ref 135–146)
Total Bilirubin: 1.1 mg/dL (ref 0.2–1.2)
Total Protein: 7.7 g/dL (ref 6.1–8.1)
eGFR: 89 mL/min/{1.73_m2} (ref >=60)

## 2022-03-30 LAB — PSA: PSA: 0.4 ng/mL (ref <=4.00)

## 2022-03-30 LAB — TSH: TSH: 1.01 mIU/L (ref 0.40–4.50)

## 2022-04-06 ENCOUNTER — Ambulatory Visit (INDEPENDENT_AMBULATORY_CARE_PROVIDER_SITE_OTHER): Payer: Medicare HMO | Admitting: Family Medicine

## 2022-04-06 ENCOUNTER — Encounter: Payer: Self-pay | Admitting: Family Medicine

## 2022-04-06 VITALS — BP 142/78 | HR 76 | Temp 97.1°F | Ht 69.0 in

## 2022-04-06 DIAGNOSIS — M545 Low back pain, unspecified: Secondary | ICD-10-CM

## 2022-04-06 DIAGNOSIS — I10 Essential (primary) hypertension: Secondary | ICD-10-CM

## 2022-04-06 DIAGNOSIS — R7309 Other abnormal glucose: Secondary | ICD-10-CM

## 2022-04-06 DIAGNOSIS — Z0000 Encounter for general adult medical examination without abnormal findings: Secondary | ICD-10-CM

## 2022-04-06 DIAGNOSIS — M159 Polyosteoarthritis, unspecified: Secondary | ICD-10-CM

## 2022-04-06 DIAGNOSIS — N400 Enlarged prostate without lower urinary tract symptoms: Secondary | ICD-10-CM | POA: Diagnosis not present

## 2022-04-06 DIAGNOSIS — I480 Paroxysmal atrial fibrillation: Secondary | ICD-10-CM

## 2022-04-06 DIAGNOSIS — G8929 Other chronic pain: Secondary | ICD-10-CM

## 2022-04-06 DIAGNOSIS — F5101 Primary insomnia: Secondary | ICD-10-CM

## 2022-04-06 DIAGNOSIS — E782 Mixed hyperlipidemia: Secondary | ICD-10-CM

## 2022-04-06 MED ORDER — TRAMADOL HCL 50 MG PO TABS: 50.0000 mg | ORAL_TABLET | Freq: Two times a day (BID) | ORAL | 2 refills | Status: DC | PRN

## 2022-04-06 MED ORDER — ATENOLOL 25 MG PO TABS: 25.0000 mg | ORAL_TABLET | Freq: Every day | ORAL | 3 refills | Status: DC

## 2022-04-06 MED ORDER — ROSUVASTATIN CALCIUM 5 MG PO TABS: 5.0000 mg | ORAL_TABLET | ORAL | 3 refills | Status: DC

## 2022-04-06 NOTE — Patient Instructions (Addendum)
Thank you for coming to the office today.  Future Shingrix shingles vaccine when ready, nurse visit, call ahead or show up, 2 doses 2-6 months apart. Can have flu like symptoms after the vaccine and sore arm.  Cholesterol still mild elevated LDL 114  Stop taking Red Yeast Rice  Start Taking Rosuvastatin (generic Crestor) 5mg  very low dose, every other night. If develop side effect aching, please contact and we can adjust it or change again.  Refilled Atenolol Refilled Tramadol  Recent Labs    03/29/22 0852  HGBA1C 6.0*   Goal to keep improving low carb low starch sugar in diet.  DUE for FASTING BLOOD WORK (no food or drink after midnight before the lab appointment, only water or coffee without cream/sugar on the morning of)  SCHEDULE "Lab Only" visit in the morning at the clinic for lab draw in 1 YEAR  - Make sure Lab Only appointment is at about 1 week before your next appointment, so that results will be available  For Lab Results, once available within 2-3 days of blood draw, you can can log in to MyChart online to view your results and a brief explanation. Also, we can discuss results at next follow-up visit.    Please schedule a Follow-up Appointment to: Return in about 1 year (around 04/07/2023) for 1 year fasting lab only then 1 week later Annual Physical.  If you have any other questions or concerns, please feel free to call the office or send a message through MyChart. You may also schedule an earlier appointment if necessary.  Additionally, you may be receiving a survey about your experience at our office within a few days to 1 week by e-mail or mail. We value your feedback.  14/10/2022, DO [facility], [facility]
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
## 2022-04-07 ENCOUNTER — Encounter: Payer: Self-pay | Admitting: Family Medicine

## 2022-04-07 ENCOUNTER — Other Ambulatory Visit: Payer: Self-pay | Admitting: Family Medicine

## 2022-04-07 DIAGNOSIS — I10 Essential (primary) hypertension: Secondary | ICD-10-CM

## 2022-04-07 DIAGNOSIS — E782 Mixed hyperlipidemia: Secondary | ICD-10-CM

## 2022-04-07 DIAGNOSIS — Z0000 Encounter for general adult medical examination without abnormal findings: Secondary | ICD-10-CM

## 2022-04-07 DIAGNOSIS — N400 Enlarged prostate without lower urinary tract symptoms: Secondary | ICD-10-CM

## 2022-04-07 DIAGNOSIS — R7309 Other abnormal glucose: Secondary | ICD-10-CM

## 2022-04-07 NOTE — Assessment & Plan Note (Signed)
Controlled, without atrial fibrillation currently History of paroxysmal problem Followed by [facility] On Atenolol 25mg  daily rate control Has PRN Diltiazem only if need for flare up HR >100 RVR, will re order since last rx has expired

## 2022-04-07 NOTE — Assessment & Plan Note (Signed)
Cholesterol still mild elevated LDL 114  Stop taking Red Yeast Rice  Start Taking Rosuvastatin (generic Crestor) 5mg  very low dose, every other night. If develop side effect aching, please contact and we can adjust it or change again.

## 2022-04-07 NOTE — Assessment & Plan Note (Signed)
Controlled on current supplement Saw Palmetto Followed by Urology
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
## 2022-05-24 ENCOUNTER — Ambulatory Visit: Payer: Medicare HMO | Attending: Cardiology | Admitting: Cardiology

## 2022-05-24 ENCOUNTER — Encounter: Payer: Self-pay | Admitting: Cardiology

## 2022-05-24 VITALS — BP 151/78 | HR 60 | Ht 70.0 in | Wt 171.2 lb

## 2022-05-24 DIAGNOSIS — I493 Ventricular premature depolarization: Secondary | ICD-10-CM | POA: Diagnosis not present

## 2022-05-24 DIAGNOSIS — I10 Essential (primary) hypertension: Secondary | ICD-10-CM | POA: Diagnosis not present

## 2022-05-24 DIAGNOSIS — I452 Bifascicular block: Secondary | ICD-10-CM

## 2022-05-24 DIAGNOSIS — I491 Atrial premature depolarization: Secondary | ICD-10-CM | POA: Diagnosis not present

## 2022-05-24 DIAGNOSIS — I480 Paroxysmal atrial fibrillation: Secondary | ICD-10-CM | POA: Diagnosis not present

## 2022-05-24 DIAGNOSIS — E782 Mixed hyperlipidemia: Secondary | ICD-10-CM | POA: Diagnosis not present

## 2022-05-24 NOTE — Patient Instructions (Signed)
Medication Instructions:  Your Physician recommend you continue on your current medication as directed.    *If you need a refill on your cardiac medications before your next appointment, please call your pharmacy*   Lab Work: None ordered today   Testing/Procedures: None ordered today   Follow-Up: At [facility], you and your health needs are our priority.  As part of our continuing mission to provide you with exceptional heart care, we have created designated Provider Care Teams.  These Care Teams include your primary Cardiologist (physician) and Advanced Practice Providers (APPs -  Physician Assistants and Nurse Practitioners) who all work together to provide you with the care you need, when you need it.  We recommend signing up for the patient portal called "MyChart".  Sign up information is provided on this After Visit Summary.  MyChart is used to connect with patients for Virtual Visits (Telemedicine).  Patients are able to view lab/test results, encounter notes, upcoming appointments, etc.  Non-urgent messages can be sent to your provider as well.   To learn more about what you can do with MyChart, go to [email or web address].    Your next appointment:   1 year(s)  Provider:   You may see Timothy Gollan, MD or one of the following Advanced Practice Providers on your designated Care Team:   Christopher Berge, NP Ryan Dunn, PA-C Cadence Furth, PA-C Sheri Hammock, NP     

## 2022-05-25 DIAGNOSIS — L578 Other skin changes due to chronic exposure to nonionizing radiation: Secondary | ICD-10-CM | POA: Diagnosis not present

## 2022-05-25 DIAGNOSIS — L821 Other seborrheic keratosis: Secondary | ICD-10-CM | POA: Diagnosis not present

## 2022-05-25 DIAGNOSIS — Z86018 Personal history of other benign neoplasm: Secondary | ICD-10-CM | POA: Diagnosis not present

## 2022-05-25 DIAGNOSIS — C44612 Basal cell carcinoma of skin of right upper limb, including shoulder: Secondary | ICD-10-CM | POA: Diagnosis not present

## 2022-05-25 DIAGNOSIS — D485 Neoplasm of uncertain behavior of skin: Secondary | ICD-10-CM | POA: Diagnosis not present

## 2022-05-25 DIAGNOSIS — Z872 Personal history of diseases of the skin and subcutaneous tissue: Secondary | ICD-10-CM | POA: Diagnosis not present

## 2022-05-25 DIAGNOSIS — Z85828 Personal history of other malignant neoplasm of skin: Secondary | ICD-10-CM | POA: Diagnosis not present

## 2022-06-28 DIAGNOSIS — C4491 Basal cell carcinoma of skin, unspecified: Secondary | ICD-10-CM | POA: Diagnosis not present

## 2022-06-28 DIAGNOSIS — L905 Scar conditions and fibrosis of skin: Secondary | ICD-10-CM | POA: Diagnosis not present

## 2022-08-12 DIAGNOSIS — H1045 Other chronic allergic conjunctivitis: Secondary | ICD-10-CM | POA: Diagnosis not present

## 2022-08-12 DIAGNOSIS — H26493 Other secondary cataract, bilateral: Secondary | ICD-10-CM | POA: Diagnosis not present

## 2022-09-10 ENCOUNTER — Emergency Department
Admission: EM | Admit: 2022-09-10 | Discharge: 2022-09-10 | Disposition: A | Discharge status: Home / Self Care | Payer: Medicare HMO | Attending: Emergency Medicine | Admitting: Emergency Medicine

## 2022-09-10 DIAGNOSIS — Z743 Need for continuous supervision: Secondary | ICD-10-CM | POA: Diagnosis not present

## 2022-09-10 DIAGNOSIS — R55 Syncope and collapse: Secondary | ICD-10-CM | POA: Insufficient documentation

## 2022-09-10 DIAGNOSIS — R61 Generalized hyperhidrosis: Secondary | ICD-10-CM | POA: Diagnosis not present

## 2022-09-10 LAB — CBC WITH DIFFERENTIAL/PLATELET
Abs Immature Granulocytes: 0.01 10*3/uL (ref 0.00–0.07)
Basophils Absolute: 0 10*3/uL (ref 0.0–0.1)
Basophils Relative: 1 %
Eosinophils Absolute: 0.2 10*3/uL (ref 0.0–0.5)
Eosinophils Relative: 4 %
HCT: 38.8 % — ABNORMAL LOW (ref 39.0–52.0)
Hemoglobin: 13.2 g/dL (ref 13.0–17.0)
Immature Granulocytes: 0 %
Lymphocytes Relative: 33 %
Lymphs Abs: 1.8 10*3/uL (ref 0.7–4.0)
MCH: 32.5 pg (ref 26.0–34.0)
MCHC: 34 g/dL (ref 30.0–36.0)
MCV: 95.6 fL (ref 80.0–100.0)
Monocytes Absolute: 0.3 10*3/uL (ref 0.1–1.0)
Monocytes Relative: 5 %
Neutro Abs: 3.1 10*3/uL (ref 1.7–7.7)
Neutrophils Relative %: 57 %
Platelets: 191 10*3/uL (ref 150–400)
RBC: 4.06 MIL/uL — ABNORMAL LOW (ref 4.22–5.81)
RDW: 13.2 % (ref 11.5–15.5)
WBC: 5.5 10*3/uL (ref 4.0–10.5)
nRBC: 0 % (ref 0.0–0.2)

## 2022-09-10 LAB — TROPONIN I (HIGH SENSITIVITY): Troponin I (High Sensitivity): 16 ng/L (ref <18)

## 2022-09-10 LAB — BASIC METABOLIC PANEL
Anion gap: 8 (ref 5–15)
BUN: 23 mg/dL (ref 8–23)
CO2: 24 mmol/L (ref 22–32)
Calcium: 8.5 mg/dL — ABNORMAL LOW (ref 8.9–10.3)
Chloride: 100 mmol/L (ref 98–111)
Creatinine, Ser: 0.91 mg/dL (ref 0.61–1.24)
GFR, Estimated: >60 mL/min (ref >60)
Glucose, Bld: 119 mg/dL — ABNORMAL HIGH (ref 70–99)
Potassium: 4 mmol/L (ref 3.5–5.1)
Sodium: 132 mmol/L — ABNORMAL LOW (ref 135–145)

## 2022-09-10 NOTE — Discharge Instructions (Signed)
Please seek medical attention for any high fevers, chest pain, shortness of breath, change in behavior, persistent vomiting, bloody stool or any other new or concerning symptoms.  

## 2022-09-10 NOTE — ED Triage Notes (Signed)
Pt brought in via ems from restaurant. Pt was eating dinner with family, had an episode of emesis, then went unresponsive (pt leaned forward, eyes closed, approx 30 seconds.) . BG 129 with EMS. Pt is A&Ox4 on arrival.

## 2022-09-10 NOTE — ED Provider Notes (Signed)
[facility] Provider Note    Event Date/Time   First MD Initiated Contact with Patient 09/10/22 1807     (approximate)   History   Loss of Consciousness   HPI  Thomas [name] is a 77 y.o. male  who presents to the emergency department today after a syncopal episode. The patient was at a restaurant when he suddenly felt lightheaded, vomited and then passed out. Denies any chest pain or palpitations with the symptoms. He continues to feel slightly lightheaded. The patient had a similar episode happen 1-2 years ago when he was at church and passed out. Denies any recent illness. Denies any recent change to medication.    Physical Exam   Triage Vital Signs: ED Triage Vitals  Enc Vitals Group     BP 128/74     Pulse 52     Resp 22     Temp      Temp src      SpO2 98   Most recent vital signs: Vitals:   09/10/22 1813  BP: 128/74  Pulse: (!) 52  Resp: (!) 22  SpO2: 98%    General: Awake, alert, oriented. CV:  Good peripheral perfusion. Regular rate and rhythm. Resp:  Normal effort. Lungs clear. Abd:  No distention. Soft. Non tender.    ED Results / Procedures / Treatments   Labs (all labs ordered are listed, but only abnormal results are displayed) Labs Reviewed  CBC WITH DIFFERENTIAL/PLATELET - Abnormal; Notable for the following components:      Result Value   RBC 4.06 (*)    HCT 38.8 (*)    All other components within normal limits  BASIC METABOLIC PANEL - Abnormal; Notable for the following components:   Sodium 132 (*)    Glucose, Bld 119 (*)    Calcium 8.5 (*)    All other components within normal limits  TROPONIN I (HIGH SENSITIVITY)  TROPONIN I (HIGH SENSITIVITY)     EKG  I, Phineas Semen, attending physician, personally viewed and interpreted this EKG  EKG Time: 1815 Rate: 52 Rhythm: sinus bradycardia Axis: left axis deviation Intervals: qtc 450 QRS: IVCD with LAD ST changes: no st elevation Impression: abnormal  ekg   RADIOLOGY None   PROCEDURES:  Critical Care performed: No   MEDICATIONS ORDERED IN ED: Medications - No data to display   IMPRESSION / MDM / ASSESSMENT AND PLAN / ED COURSE  I reviewed the triage vital signs and the nursing notes.                              Differential diagnosis includes, but is not limited to, anemia, electrolyte abnormality, arrhythmia, ACS  Patient's presentation is most consistent with acute presentation with potential threat to life or bodily function.   The patient is on the cardiac monitor to evaluate for evidence of arrhythmia and/or significant heart rate changes.  Patient presented to the emergency department today after a syncopal episode. EKG without concerning arrhythmia. Blood work without concerning electrolyte abnormality or anemia. Troponin was negative. Discussed with patient obtaining a second troponin for further cardiac evaluation. Discussed even with negative initial troponin could still have been a cardiac event. At this time however patient feels comfortable deferring second troponin and being discharged. Feels it is similar to his previous episode. Discussed return precautions.    FINAL CLINICAL IMPRESSION(S) / ED DIAGNOSES   Final diagnoses:  Syncope, unspecified  syncope type     Note:  This document was prepared using Dragon voice recognition software and may include unintentional dictation errors.    Phineas Semen, MD 09/10/22 2113

## 2022-09-19 ENCOUNTER — Ambulatory Visit (INDEPENDENT_AMBULATORY_CARE_PROVIDER_SITE_OTHER): Payer: Medicare HMO | Admitting: Family Medicine

## 2022-09-19 ENCOUNTER — Encounter: Payer: Self-pay | Admitting: Family Medicine

## 2022-09-19 VITALS — BP 132/72 | HR 50 | Ht 70.0 in | Wt 168.0 lb

## 2022-09-19 DIAGNOSIS — I10 Essential (primary) hypertension: Secondary | ICD-10-CM | POA: Diagnosis not present

## 2022-09-19 DIAGNOSIS — R55 Syncope and collapse: Secondary | ICD-10-CM
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
## 2022-09-19 NOTE — Patient Instructions (Addendum)
Thank you for coming to the office today.  Likely a Vasovagal Syncopal episode related to the nausea vomiting incident.  Keep an eye on Blood Pressure, Heart rate, and symptoms  We will route this to Cardiology and please keep them in the loop in January  DUE for FASTING BLOOD WORK (no food or drink after midnight before the lab appointment, only water or coffee without cream/sugar on the morning of)  SCHEDULE "Lab Only" visit in the morning at the clinic for lab draw in 04/2023  - Make sure Lab Only appointment is at about 1 week before your next appointment, so that results will be available  For Lab Results, once available within 2-3 days of blood draw, you can can log in to MyChart online to view your results and a brief explanation. Also, we can discuss results at next follow-up visit.   Please schedule a Follow-up Appointment to: Return for 1st week of Dec - Annual Physical / labs.  If you have any other questions or concerns, please feel free to call the office or send a message through MyChart. You may also schedule an earlier appointment if necessary.  Additionally, you may be receiving a survey about your experience at our office within a few days to 1 week by e-mail or mail. We value your feedback.  Saralyn Pilar, DO [facility], [place]

## 2022-09-22 ENCOUNTER — Telehealth: Payer: Self-pay | Admitting: Cardiovascular Disease

## 2022-09-22 NOTE — Telephone Encounter (Signed)
Patient's wife states she is returning a call but she does not know who called or what it was regarding.

## 2022-09-22 NOTE — Telephone Encounter (Signed)
Spoke with patients wife and after chart review there was no notes for anyone who may have called. She verbalized understanding with no further questions.

## 2022-10-13 ENCOUNTER — Ambulatory Visit
Admission: EM | Admit: 2022-10-13 | Discharge: 2022-10-13 | Disposition: A | Discharge status: Home / Self Care | Payer: Medicare HMO | Attending: Nurse Practitioner | Admitting: Nurse Practitioner

## 2022-10-13 DIAGNOSIS — H6122 Impacted cerumen, left ear: Secondary | ICD-10-CM

## 2022-10-13 DIAGNOSIS — J069 Acute upper respiratory infection, unspecified: Secondary | ICD-10-CM | POA: Diagnosis not present
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
## 2022-10-13 NOTE — ED Triage Notes (Signed)
Patient here today with c/o left ear clogged X 1 week. Patient states that it happens about every 2 years.   He is also c/o cough, itchy eyes, runny nose, and nasal congestion X 1 week. He has been taking Tylenol for the pain that he is having on the left side of hit face. His wife is also being seen here for the same symptoms.

## 2022-10-13 NOTE — Discharge Instructions (Signed)
Follow-up with your PCP in 3 to 4 days for recheck.  Please return to clinic if you have worsening respiratory symptoms such as fever, shortness of breath I hope you feel better soon!

## 2022-10-18 ENCOUNTER — Emergency Department: Payer: Medicare HMO

## 2022-10-18 ENCOUNTER — Inpatient Hospital Stay
Admission: EM | Admit: 2022-10-18 | Discharge: 2022-10-20 | DRG: 310 | Disposition: A | Discharge status: Other Acute Inpatient Hospital | Payer: Medicare HMO | Attending: Internal Medicine | Admitting: Internal Medicine

## 2022-10-18 ENCOUNTER — Other Ambulatory Visit: Payer: Self-pay

## 2022-10-18 ENCOUNTER — Emergency Department (HOSPITAL_COMMUNITY)
Admit: 2022-10-18 | Discharge: 2022-10-18 | Disposition: A | Discharge status: Home / Self Care | Payer: Medicare HMO | Attending: Internal Medicine | Admitting: Internal Medicine

## 2022-10-18 DIAGNOSIS — Z83438 Family history of other disorder of lipoprotein metabolism and other lipidemia: Secondary | ICD-10-CM

## 2022-10-18 DIAGNOSIS — I10 Essential (primary) hypertension: Secondary | ICD-10-CM | POA: Diagnosis not present

## 2022-10-18 DIAGNOSIS — I480 Paroxysmal atrial fibrillation: Secondary | ICD-10-CM | POA: Diagnosis present

## 2022-10-18 DIAGNOSIS — Z833 Family history of diabetes mellitus: Secondary | ICD-10-CM

## 2022-10-18 DIAGNOSIS — Z823 Family history of stroke: Secondary | ICD-10-CM

## 2022-10-18 DIAGNOSIS — N400 Enlarged prostate without lower urinary tract symptoms: Secondary | ICD-10-CM | POA: Diagnosis not present

## 2022-10-18 DIAGNOSIS — R55 Syncope and collapse: Secondary | ICD-10-CM | POA: Diagnosis not present

## 2022-10-18 DIAGNOSIS — I4430 Unspecified atrioventricular block: Secondary | ICD-10-CM | POA: Diagnosis present

## 2022-10-18 DIAGNOSIS — I493 Ventricular premature depolarization: Secondary | ICD-10-CM | POA: Diagnosis present

## 2022-10-18 DIAGNOSIS — Z888 Allergy status to other drugs, medicaments and biological substances status: Secondary | ICD-10-CM

## 2022-10-18 DIAGNOSIS — G4700 Insomnia, unspecified: Secondary | ICD-10-CM | POA: Diagnosis present

## 2022-10-18 DIAGNOSIS — Z7983 Long term (current) use of bisphosphonates: Secondary | ICD-10-CM | POA: Diagnosis not present

## 2022-10-18 DIAGNOSIS — Z880 Allergy status to penicillin: Secondary | ICD-10-CM | POA: Diagnosis not present

## 2022-10-18 DIAGNOSIS — E785 Hyperlipidemia, unspecified: Secondary | ICD-10-CM | POA: Diagnosis not present

## 2022-10-18 DIAGNOSIS — I459 Conduction disorder, unspecified: Secondary | ICD-10-CM | POA: Diagnosis not present

## 2022-10-18 DIAGNOSIS — Z87891 Personal history of nicotine dependence: Secondary | ICD-10-CM | POA: Diagnosis not present

## 2022-10-18 DIAGNOSIS — I482 Chronic atrial fibrillation, unspecified: Secondary | ICD-10-CM

## 2022-10-18 DIAGNOSIS — R001 Bradycardia, unspecified: Secondary | ICD-10-CM | POA: Diagnosis not present

## 2022-10-18 DIAGNOSIS — R7989 Other specified abnormal findings of blood chemistry: Secondary | ICD-10-CM

## 2022-10-18 DIAGNOSIS — F419 Anxiety disorder, unspecified: Secondary | ICD-10-CM | POA: Diagnosis present

## 2022-10-18 DIAGNOSIS — I441 Atrioventricular block, second degree: Principal | ICD-10-CM

## 2022-10-18 DIAGNOSIS — Z8249 Family history of ischemic heart disease and other diseases of the circulatory system: Secondary | ICD-10-CM

## 2022-10-18 DIAGNOSIS — N401 Enlarged prostate with lower urinary tract symptoms: Secondary | ICD-10-CM | POA: Diagnosis present

## 2022-10-18 DIAGNOSIS — Z7901 Long term (current) use of anticoagulants: Secondary | ICD-10-CM

## 2022-10-18 DIAGNOSIS — Z79899 Other long term (current) drug therapy: Secondary | ICD-10-CM | POA: Diagnosis not present

## 2022-10-18 LAB — URINALYSIS, ROUTINE W REFLEX MICROSCOPIC
Bilirubin Urine: NEGATIVE
Glucose, UA: NEGATIVE mg/dL
Hgb urine dipstick: NEGATIVE
Ketones, ur: NEGATIVE mg/dL
Leukocytes,Ua: NEGATIVE
Nitrite: NEGATIVE
Protein, ur: NEGATIVE mg/dL
Specific Gravity, Urine: 1.019 (ref 1.005–1.030)
pH: 5 (ref 5.0–8.0)

## 2022-10-18 LAB — HEPATIC FUNCTION PANEL
ALT: 15 U/L (ref 0–44)
AST: 19 U/L (ref 15–41)
Albumin: 3.2 g/dL — ABNORMAL LOW (ref 3.5–5.0)
Alkaline Phosphatase: 43 U/L (ref 38–126)
Bilirubin, Direct: 0.1 mg/dL (ref 0.0–0.2)
Indirect Bilirubin: 0.9 mg/dL (ref 0.3–0.9)
Total Bilirubin: 1 mg/dL (ref 0.3–1.2)
Total Protein: 7.6 g/dL (ref 6.5–8.1)

## 2022-10-18 LAB — PROTIME-INR
INR: 1.4 — ABNORMAL HIGH (ref 0.8–1.2)
Prothrombin Time: 16.9 seconds — ABNORMAL HIGH (ref 11.4–15.2)

## 2022-10-18 LAB — HEPARIN LEVEL (UNFRACTIONATED): Heparin Unfractionated: >1.1 IU/mL — ABNORMAL HIGH (ref 0.30–0.70)

## 2022-10-18 LAB — CBC
HCT: 39.3 % (ref 39.0–52.0)
Hemoglobin: 13.4 g/dL (ref 13.0–17.0)
MCH: 32.7 pg (ref 26.0–34.0)
MCHC: 34.1 g/dL (ref 30.0–36.0)
MCV: 95.9 fL (ref 80.0–100.0)
Platelets: 205 10*3/uL (ref 150–400)
RBC: 4.1 MIL/uL — ABNORMAL LOW (ref 4.22–5.81)
RDW: 13.9 % (ref 11.5–15.5)
WBC: 6.2 10*3/uL (ref 4.0–10.5)
nRBC: 0 % (ref 0.0–0.2)

## 2022-10-18 LAB — TROPONIN I (HIGH SENSITIVITY)
Troponin I (High Sensitivity): 26 ng/L — ABNORMAL HIGH (ref <18)
Troponin I (High Sensitivity): 25 ng/L — ABNORMAL HIGH (ref <18)

## 2022-10-18 LAB — BASIC METABOLIC PANEL
Anion gap: 12 (ref 5–15)
BUN: 24 mg/dL — ABNORMAL HIGH (ref 8–23)
CO2: 22 mmol/L (ref 22–32)
Calcium: 8.7 mg/dL — ABNORMAL LOW (ref 8.9–10.3)
Chloride: 102 mmol/L (ref 98–111)
Creatinine, Ser: 1.09 mg/dL (ref 0.61–1.24)
GFR, Estimated: >60 mL/min (ref >60)
Glucose, Bld: 97 mg/dL (ref 70–99)
Potassium: 4.3 mmol/L (ref 3.5–5.1)
Sodium: 133 mmol/L — ABNORMAL LOW (ref 135–145)

## 2022-10-18 LAB — MAGNESIUM: Magnesium: 2.3 mg/dL (ref 1.7–2.4)

## 2022-10-18 LAB — TSH: TSH: 1.443 u[IU]/mL (ref 0.350–4.500)

## 2022-10-18 LAB — APTT: aPTT: 47 seconds — ABNORMAL HIGH (ref 24–36)

## 2022-10-18 MED ORDER — ROSUVASTATIN CALCIUM 5 MG PO TABS
5.0000 mg | ORAL_TABLET | ORAL | Status: DC
  Administered 2022-10-19: 5 mg via ORAL
  Filled 2022-10-18: qty 1

## 2022-10-18 MED ORDER — LACTATED RINGERS IV SOLN: INTRAVENOUS | Status: AC

## 2022-10-18 MED ORDER — ASPIRIN 81 MG PO CHEW
324.0000 mg | CHEWABLE_TABLET | Freq: Once | ORAL | Status: AC
  Administered 2022-10-18: 324 mg via ORAL
  Filled 2022-10-18: qty 4

## 2022-10-18 MED ORDER — ACETAMINOPHEN 325 MG PO TABS: 650.0000 mg | ORAL_TABLET | Freq: Four times a day (QID) | ORAL | Status: DC | PRN

## 2022-10-18 MED ORDER — SODIUM CHLORIDE 0.9% FLUSH
3.0000 mL | Freq: Two times a day (BID) | INTRAVENOUS | Status: DC
  Administered 2022-10-18 – 2022-10-20 (×4): 3 mL via INTRAVENOUS

## 2022-10-18 MED ORDER — ACETAMINOPHEN 650 MG RE SUPP: 650.0000 mg | Freq: Four times a day (QID) | RECTAL | Status: DC | PRN

## 2022-10-18 MED ORDER — POLYETHYLENE GLYCOL 3350 17 G PO PACK: 17.0000 g | PACK | Freq: Every day | ORAL | Status: DC | PRN

## 2022-10-18 MED ORDER — HYDRALAZINE HCL 20 MG/ML IJ SOLN: 10.0000 mg | INTRAMUSCULAR | Status: DC | PRN

## 2022-10-18 MED ORDER — HEPARIN (PORCINE) 25000 UT/250ML-% IV SOLN
1000.0000 [IU]/h | INTRAVENOUS | Status: DC
  Administered 2022-10-18: 1100 [IU]/h via INTRAVENOUS
  Administered 2022-10-19: 1000 [IU]/h via INTRAVENOUS
  Filled 2022-10-18 (×2): qty 250
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
## 2022-10-18 NOTE — ED Provider Notes (Signed)
[facility] Provider Note    Event Date/Time   First MD Initiated Contact with Patient 10/18/22 1624     (approximate)   History   Bradycardia   HPI  Thomas [name] is a 77 y.o. male presenting to the emergency department for evaluation of low heart rate.  About a month ago, patient had a syncopal episode and was seen in our ER.  Workup reassuring at that time and was discharged home.  Had a second syncopal episode around Mother's Day, did not seek care at that time.  They spoke with her primary care doctor who noted that passing out could be related to low heart rates.  Family noted that patient's heart rate was low yesterday and today so they came into the ER.  Initial heart rate in triage was 64, but did drop down to 29 which time patient was brought back to her room for further evaluation.  Denies chest pain, shortness of breath.  He does take atenolol and Eliquis.  Reports a history of A-fib.  He does have Cardizem on his medication list, but does not think that he takes this.  No nausea, vomiting, abdominal pain.  Currently denies lightheadedness, but has felt this intermittently over the past couple days.     Physical Exam   Triage Vital Signs: ED Triage Vitals [10/18/22 1606]  Enc Vitals Group     BP (!) 178/72     Pulse Rate 64     Resp 17     Temp 98.3 F (36.8 C)     Temp Source Oral     SpO2 93 %     Weight 172 lb (78 kg)     Height 5' 9.5" (1.765 m)     Head Circumference      Peak Flow      Pain Score 0     Pain Loc      Pain Edu?      Excl. in GC?     Most recent vital signs: Vitals:   10/18/22 2055 10/18/22 2303  BP: (!) 186/63 (!) 134/57  Pulse: (!) 35 (!) 34  Resp: 15 18  Temp: 97.6 F (36.4 C) 98.2 F (36.8 C)  SpO2: 100% 98%     General: Awake, interactive  CV:  Bradycardic with regular rhythm, initially in the 30s, did spontaneously improved to the 50s, normal peripheral perfusion Resp:  Lungs clear, unlabored  respirations.  Abd:  Soft, nondistended.  Neuro:  Symmetric facial movement, fluid speech   ED Results / Procedures / Treatments   Labs (all labs ordered are listed, but only abnormal results are displayed) Labs Reviewed  BASIC METABOLIC PANEL - Abnormal; Notable for the following components:      Result Value   Sodium 133 (*)    BUN 24 (*)    Calcium 8.7 (*)    All other components within normal limits  CBC - Abnormal; Notable for the following components:   RBC 4.10 (*)    All other components within normal limits  URINALYSIS, ROUTINE W REFLEX MICROSCOPIC - Abnormal; Notable for the following components:   Color, Urine YELLOW (*)    APPearance CLEAR (*)    All other components within normal limits  HEPATIC FUNCTION PANEL - Abnormal; Notable for the following components:   Albumin 3.2 (*)    All other components within normal limits  HEPARIN LEVEL (UNFRACTIONATED) - Abnormal; Notable for the following components:   Heparin Unfractionated >1.10 (*)  All other components within normal limits  PROTIME-INR - Abnormal; Notable for the following components:   Prothrombin Time 16.9 (*)    INR 1.4 (*)    All other components within normal limits  APTT - Abnormal; Notable for the following components:   aPTT 47 (*)    All other components within normal limits  TROPONIN I (HIGH SENSITIVITY) - Abnormal; Notable for the following components:   Troponin I (High Sensitivity) 26 (*)    All other components within normal limits  TROPONIN I (HIGH SENSITIVITY) - Abnormal; Notable for the following components:   Troponin I (High Sensitivity) 25 (*)    All other components within normal limits  MAGNESIUM  TSH  LYME DISEASE SEROLOGY W/REFLEX  BASIC METABOLIC PANEL  APTT  CBC     EKG EKG independently reviewed interpreted by myself (ER attending) demonstrates:  EKG from 1617 demonstrates sinus bradycardia at a rate of 29 with heart block present, differential including a Mobitz type  I or type II block or possibly third-degree heart block though less likely.  Right bundle branch block and left anterior fascicular block noted.  RADIOLOGY Imaging independently reviewed and interpreted by myself demonstrates:    PROCEDURES:  Critical Care performed: No  Procedures   MEDICATIONS ORDERED IN ED: Medications  lactated ringers infusion ( Intravenous New Bag/Given 10/18/22 2056)  rosuvastatin (CRESTOR) tablet 5 mg (has no administration in time range)  sodium chloride flush (NS) 0.9 % injection 3 mL (3 mLs Intravenous Given 10/18/22 2057)  hydrALAZINE (APRESOLINE) injection 10 mg (has no administration in time range)  acetaminophen (TYLENOL) tablet 650 mg (has no administration in time range)    Or  acetaminophen (TYLENOL) suppository 650 mg (has no administration in time range)  polyethylene glycol (MIRALAX / GLYCOLAX) packet 17 g (has no administration in time range)  heparin ADULT infusion 100 units/mL (25000 units/234mL) (1,100 Units/hr Intravenous Infusion Verify 10/18/22 2200)  aspirin chewable tablet 324 mg (324 mg Oral Given 10/18/22 1954)     IMPRESSION / MDM / ASSESSMENT AND PLAN / ED COURSE  I reviewed the triage vital signs and the nursing notes.  Differential diagnosis includes, but is not limited to, heart block secondary to medication, thyroid dysfunction, intrinsic pathology of the heart  Patient's presentation is most consistent with acute presentation with potential threat to life or bodily function.  77 year old male presenting with bradycardia with EKG demonstrating likely second-degree heart block, 2-1 conduction making it difficult to determine if this is Mobitz type I or type II.  Currently stable, but has had syncopal episodes recently.  Case was reviewed with Dr. Okey Dupre.  He did recommend holding the patient's atenolol and diltiazem and admitted patient to the hospitalist for further evaluation.  If patient's heart rate does not improve, may require  pacemaker.  He will also evaluate the patient for further recommendations.  Clinical Course as of 10/18/22 2318  Tue Oct 18, 2022  1308 Lab work demonstrates mild troponin elevation at 26.  TSH within normal limits.  CBC, CMP without severe derangement.  Will reach out to hospitalist team to discuss admission. [NR]    Clinical Course User Index [NR] Trinna Post, MD   Case reviewed with hospitalist team.  They will come evaluate the patient for anticipated admission.  FINAL CLINICAL IMPRESSION(S) / ED DIAGNOSES   Final diagnoses:  Bradycardia  Heart block     Rx / DC Orders   ED Discharge Orders     None  Note:  This document was prepared using Dragon voice recognition software and may include unintentional dictation errors.   Trinna Post, MD 10/18/22 [phone]

## 2022-10-18 NOTE — Assessment & Plan Note (Addendum)
With 2-1 conduction.  Thyroid cascade within normal limits.  Troponins are flat.  Given some T  wave peaking in EKG, I will give aspirin 325 now.  Patient's atenolol will be held, diltiazem will be held.  I will check Lyme serology with reflex.  Pending echo. Asymptomatic.  Hope is that once that atenolol is stopped, this will resolved.
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
## 2022-10-18 NOTE — Assessment & Plan Note (Signed)
This is likely due to the conduction block itself and not suggestive of acute coronary syndrome at this time.  Aspirin has been ordered

## 2022-10-18 NOTE — Assessment & Plan Note (Signed)
>>  ASSESSMENT AND PLAN FOR PAROXYSMAL ATRIAL FIBRILLATION (HCC) WRITTEN ON 10/18/2022  7:28 PM BY MOODY ALTO, MD  This is a chronic diagnosis.  At this time apixaban  will be held given possibility of patient requiring invasive procedures.  Rate controlled medications are being held as above.  Cardiology would like to start heparin  bridging.  I will put in request for pharmacy to help in this regard

## 2022-10-18 NOTE — Assessment & Plan Note (Signed)
This is a chronic diagnosis.  At this time apixaban will be held given possibility of patient requiring invasive procedures.  Rate controlled medications are being held as above.  Cardiology would like to start heparin bridging.  I will put in request for pharmacy to help in this regard

## 2022-10-18 NOTE — ED Triage Notes (Signed)
Pt to er, sig other states that pt has been passing out off and on for the past month or so, states that he has a low heart rate, states that he has been evaluated a few times and everything seems to be ok, pt has heart rate of 40 in triage.

## 2022-10-19 DIAGNOSIS — I441 Atrioventricular block, second degree: Secondary | ICD-10-CM | POA: Diagnosis not present

## 2022-10-19 DIAGNOSIS — R001 Bradycardia, unspecified: Secondary | ICD-10-CM

## 2022-10-19 DIAGNOSIS — R55 Syncope and collapse: Secondary | ICD-10-CM

## 2022-10-19 LAB — ECHOCARDIOGRAM COMPLETE
Area-P 1/2: 2.36 cm2
Calc EF: 62.1 %
Height: 69.5 in
S' Lateral: 3.1 cm
Single Plane A2C EF: 67.5 %
Single Plane A4C EF: 57.3 %
Weight: 2752 oz

## 2022-10-19 LAB — BASIC METABOLIC PANEL
Anion gap: 9 (ref 5–15)
BUN: 18 mg/dL (ref 8–23)
CO2: 23 mmol/L (ref 22–32)
Calcium: 8.3 mg/dL — ABNORMAL LOW (ref 8.9–10.3)
Chloride: 102 mmol/L (ref 98–111)
Creatinine, Ser: 0.86 mg/dL (ref 0.61–1.24)
GFR, Estimated: >60 mL/min (ref >60)
Glucose, Bld: 97 mg/dL (ref 70–99)
Potassium: 4.1 mmol/L (ref 3.5–5.1)
Sodium: 134 mmol/L — ABNORMAL LOW (ref 135–145)

## 2022-10-19 LAB — APTT
aPTT: 103 seconds — ABNORMAL HIGH (ref 24–36)
aPTT: 74 seconds — ABNORMAL HIGH (ref 24–36)

## 2022-10-19 LAB — CBC
HCT: 35.4 % — ABNORMAL LOW (ref 39.0–52.0)
Hemoglobin: 12.2 g/dL — ABNORMAL LOW (ref 13.0–17.0)
MCH: 32.8 pg (ref 26.0–34.0)
MCHC: 34.5 g/dL (ref 30.0–36.0)
MCV: 95.2 fL (ref 80.0–100.0)
Platelets: 182 10*3/uL (ref 150–400)
RBC: 3.72 MIL/uL — ABNORMAL LOW (ref 4.22–5.81)
RDW: 13.7 % (ref 11.5–15.5)
WBC: 4.5 10*3/uL (ref 4.0–10.5)
nRBC: 0 % (ref 0.0–0.2)

## 2022-10-19 LAB — HEPARIN LEVEL (UNFRACTIONATED): Heparin Unfractionated: 0.82 IU/mL — ABNORMAL HIGH (ref 0.30–0.70)

## 2022-10-19 MED ORDER — ATROPINE SULFATE 1 MG/10ML IJ SOSY
0.5000 mg | PREFILLED_SYRINGE | Freq: Once | INTRAMUSCULAR | Status: AC
  Administered 2022-10-19: 0.5 mg via INTRAVENOUS
  Filled 2022-10-19: qty 10

## 2022-10-19 MED ORDER — ATROPINE SULFATE 1 MG/10ML IJ SOSY
PREFILLED_SYRINGE | INTRAMUSCULAR | Status: AC
  Filled 2022-10-19: qty 10

## 2022-10-19 MED ORDER — ATROPINE SULFATE 0.4 MG/ML IV SOLN
0.5000 mg | Freq: Once | INTRAVENOUS | Status: DC
  Filled 2022-10-19: qty 1.25

## 2022-10-19 NOTE — Progress Notes (Signed)
Transition of Care [facility]) - Inpatient Brief Assessment   Patient Details  Name: Thomas [name] MRN: [id number] Date of Birth: Oct 05, 1945  Transition of Care [facility]) CM/SW Contact:    Garret Reddish, RN Phone Number: 10/19/2022, 12:37 PM   Clinical Narrative:  Chart reviewed.  Noted that patient was admitted with AV block and syncope.  I have spoken with patient.  He informs me that prior to admission he lived at home with his wife.  He was able to bath and dress himself.  Patient reports that he does not use any assistive devices to ambulate.  Patient reports that his PCP is [facility] and he sees Dr. Saralyn Pilar.  Patient reports that medications are affordable.  He uses Psych[facility], [facility] for prescription medications.  Patient plans on returning home with wife on discharge.  No TOC needs identified at this time.   TOC will follow for any discharge needs.     Transition of Care Assessment: Insurance and Status: Insurance coverage has been reviewed Patient has primary care physician: Yes Saralyn Pilar [facility] Medical )) Home environment has been reviewed: Yes Prior level of function:: Independent prior to admission Prior/Current Home Services: No current home services Social Determinants of Health Reivew: SDOH reviewed no interventions necessary Readmission risk has been reviewed: Yes Transition of care needs: no transition of care needs at this time

## 2022-10-19 NOTE — Hospital Course (Addendum)
Thomas [name] is a 77 y.o. male with medical history significant of paroxysmal atrial fibrillation.  Patient is on chronic atenolol therapy.  Patient was in his usual state of health till about a month ago when 2 episodes LOC (last about 5 days prior to ED arrival) without any symptoms, and w/ prompt return to neurologic baseline. Notes HR 20s on occasion w/ self-monitoring. Sent to ED.  06/18: to ED. Cardiology consult, stopping beta blocker  06/19: remains bradycardic, additional 24 to 36-hour washout before making any decision on pacemaker. No concerns on Echo.   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  Troponins are flat.   Telemetry Atropine if symptomatic bradycardia  atenolol will be held, diltiazem will be held.  additional 24 to 36-hour washout before making any decision on pacemaker  Echo no concerns If not improving will need EP to see tomorrow per Dr End Lyme serology with reflex.   Troponin I above reference range likely supply-demand mismatch due to the bradycardia itself and not suggestive of acute coronary syndrome at this time.  Echo - Normal ejection fraction on echo, no plans at this time for ischemic workup   Atrial fibrillation, chronic, paroxysmal (HCC) apixaban will be held given possibility of patient requiring invasive procedures.   Rate controlled medications are being held as above.   Heparin per pharmacy   HLD Statin    DVT prophylaxis: heparin  Pertinent IV fluids/nutrition: no continuous IV fluids  Central lines / invasive devices: none   Code  Status: FULL CODE ACP documentation reviewed: 10/19/22 none on file  Current Admission Status: inpatient  TOC needs / Dispo plan: anticiapte d/c home, no TOC needs at this time  Barriers to discharge / significant pending items: beta blocker washout as above, may need pacemaker depending on HR, anticipate will be here another 2-4 days

## 2022-10-19 NOTE — Plan of Care (Signed)
Patient AOX4, VSS throughout shift.  Remains in 2nd degree heart block with some A.fib and bradycardia.  HR remained in 30s, as low as 29.  Provider aware.  Pt remained asymptomatic.  Pt got 0.5mg  atropine which did not resolved bradycardia.  Diminished lungs, IS encouraged.  Pt voided in urinal.  All meds given on time as ordered.  POC maintained, will continue to monitor.  Problem: Education: Goal: Knowledge of General Education information will improve Description: Including pain rating scale, medication(s)/side effects and non-pharmacologic comfort measures 10/19/2022 0643 by Burnett Corrente, RN Outcome: Progressing 10/19/2022 0643 by Burnett Corrente, RN Outcome: Progressing   Problem: Health Behavior/Discharge Planning: Goal: Ability to manage health-related needs will improve 10/19/2022 0643 by Burnett Corrente, RN Outcome: Progressing 10/19/2022 0643 by Burnett Corrente, RN Outcome: Progressing   Problem: Clinical Measurements: Goal: Ability to maintain clinical measurements within normal limits will improve 10/19/2022 0643 by Burnett Corrente, RN Outcome: Progressing 10/19/2022 0643 by Burnett Corrente, RN Outcome: Progressing Goal: Will remain free from infection 10/19/2022 0643 by Burnett Corrente, RN Outcome: Progressing 10/19/2022 0643 by Burnett Corrente, RN Outcome: Progressing Goal: Diagnostic test results will improve 10/19/2022 0643 by Burnett Corrente, RN Outcome: Progressing 10/19/2022 0643 by Burnett Corrente, RN Outcome: Progressing Goal: Respiratory complications will improve 10/19/2022 0643 by Burnett Corrente, RN Outcome: Progressing 10/19/2022 0643 by Burnett Corrente, RN Outcome: Progressing Goal: Cardiovascular complication will be avoided 10/19/2022 0643 by Burnett Corrente, RN Outcome: Progressing 10/19/2022 0643 by Burnett Corrente, RN Outcome: Progressing   Problem: Activity: Goal: Risk for activity intolerance will decrease 10/19/2022 0643 by Burnett Corrente, RN Outcome: Progressing 10/19/2022  0643 by Burnett Corrente, RN Outcome: Progressing   Problem: Nutrition: Goal: Adequate nutrition will be maintained 10/19/2022 0643 by Burnett Corrente, RN Outcome: Progressing 10/19/2022 0643 by Burnett Corrente, RN Outcome: Progressing   Problem: Coping: Goal: Level of anxiety will decrease 10/19/2022 0643 by Burnett Corrente, RN Outcome: Progressing 10/19/2022 0643 by Burnett Corrente, RN Outcome: Progressing   Problem: Elimination: Goal: Will not experience complications related to bowel motility 10/19/2022 0643 by Burnett Corrente, RN Outcome: Progressing 10/19/2022 0643 by Burnett Corrente, RN Outcome: Progressing Goal: Will not experience complications related to urinary retention 10/19/2022 0643 by Burnett Corrente, RN Outcome: Progressing 10/19/2022 0643 by Burnett Corrente, RN Outcome: Progressing   Problem: Pain Managment: Goal: General experience of comfort will improve 10/19/2022 0643 by Burnett Corrente, RN Outcome: Progressing 10/19/2022 0643 by Burnett Corrente, RN Outcome: Progressing   Problem: Safety: Goal: Ability to remain free from injury will improve 10/19/2022 0643 by Burnett Corrente, RN Outcome: Progressing 10/19/2022 0643 by Burnett Corrente, RN Outcome: Progressing   Problem: Skin Integrity: Goal: Risk for impaired skin integrity will decrease 10/19/2022 0643 by Burnett Corrente, RN Outcome: Progressing 10/19/2022 0643 by Burnett Corrente, RN Outcome: Progressing

## 2022-10-19 NOTE — Progress Notes (Signed)
Rounding Note    Patient Name: Thomas [name] Date of Encounter: 10/19/2022  [facility] Cardiologist: Julien Nordmann, MD   Subjective   Reports relatively asymptomatic Discussed recent syncope episode, was out at restaurant with family for Mother's Day, eating when he developed syncope at the table Last took his atenolol yesterday morning Telemetry showing normal sinus rhythm with frequent episodes of 2-1 AV block rates in the 30s  Inpatient Medications    Scheduled Meds:  rosuvastatin  5 mg Oral Q48H   sodium chloride flush  3 mL Intravenous Q12H   Continuous Infusions:  heparin 1,000 Units/hr (10/19/22 0701)   PRN Meds: acetaminophen **OR** acetaminophen, hydrALAZINE, polyethylene glycol   Vital Signs    Vitals:   10/19/22 0055 10/19/22 0101 10/19/22 0256 10/19/22 0737  BP: (!) 123/50 (!) 152/71 (!) 153/60 (!) 142/69  Pulse: (!) 32 (!) 45 (!) 32 (!) 33  Resp:   18 18  Temp:   97.9 F (36.6 C) 98.1 F (36.7 C)  TempSrc:      SpO2: 96% 98% 99% 96%  Weight:   78.2 kg   Height:        Intake/Output Summary (Last 24 hours) at 10/19/2022 1057 Last data filed at 10/19/2022 1041 Gross per 24 hour  Intake 300.83 ml  Output 625 ml  Net -324.17 ml      10/19/2022    2:56 AM 10/18/2022    4:06 PM 10/13/2022    8:29 AM  Last 3 Weights  Weight (lbs) 172 lb 6.4 oz 172 lb 179 lb  Weight (kg) 78.2 kg 78.019 kg 81.194 kg      Telemetry     Telemetry showing normal sinus rhythm with frequent episodes of 2-1 AV block rates in the 30s- Personally Reviewed  ECG     - Personally Reviewed  Physical Exam   GEN: No acute distress.   Neck: No JVD Cardiac: RRR, no murmurs, rubs, or gallops.  Respiratory: Clear to auscultation bilaterally. GI: Soft, nontender, non-distended  MS: No edema; No deformity. Neuro:  Nonfocal  Psych: Normal affect   Labs    High Sensitivity Troponin:   Recent Labs  Lab 10/18/22 1631 10/18/22 1848  TROPONINIHS 26* 25*      Chemistry Recent Labs  Lab 10/18/22 1624 10/19/22 0607  NA 133* 134*  K 4.3 4.1  CL 102 102  CO2 22 23  GLUCOSE 97 97  BUN 24* 18  CREATININE 1.09 0.86  CALCIUM 8.7* 8.3*  MG 2.3  --   PROT 7.6  --   ALBUMIN 3.2*  --   AST 19  --   ALT 15  --   ALKPHOS 43  --   BILITOT 1.0  --   GFRNONAA >60 >60  ANIONGAP 12 9    Lipids No results for input(s): "CHOL", "TRIG", "HDL", "LABVLDL", "LDLCALC", "CHOLHDL" in the last 168 hours.  Hematology Recent Labs  Lab 10/18/22 1624 10/19/22 0607  WBC 6.2 4.5  RBC 4.10* 3.72*  HGB 13.4 12.2*  HCT 39.3 35.4*  MCV 95.9 95.2  MCH 32.7 32.8  MCHC 34.1 34.5  RDW 13.9 13.7  PLT 205 182   Thyroid  Recent Labs  Lab 10/18/22 1624  TSH 1.443    BNPNo results for input(s): "BNP", "PROBNP" in the last 168 hours.  DDimer No results for input(s): "DDIMER" in the last 168 hours.   Radiology    ECHOCARDIOGRAM COMPLETE  Result Date: 10/19/2022    ECHOCARDIOGRAM  REPORT   Patient Name:   Thomas [name] Date of Exam: 10/18/2022 Medical Rec #:  [id number]     Height:       69.5 in Accession #:    [id number]    Weight:       172.0 lb Date of Birth:  19-Jun-1945     BSA:          1.947 m Patient Age:    77 years      BP:           178/72 mmHg Patient Gender: M             HR:           49 bpm. Exam Location:  [facility] Procedure: 2D Echo, Cardiac Doppler and Color Doppler Indications:     R55 Syncope  History:         Patient has prior history of Echocardiogram examinations, most                  recent 11/04/2015. Arrythmias:Paroxysmal Atrial Fibrillation;                  Risk Factors:Hypertension and Dyslipidemia. Palpitations.  Sonographer:     Daphine Deutscher RDCS Referring Phys:  [id number] CHRISTOPHER END Diagnosing Phys: Yvonne Kendall MD IMPRESSIONS  1. Left ventricular ejection fraction, by estimation, is 55 to 60%. The left ventricle has normal function. The left ventricle demonstrates regional wall motion abnormalities (see scoring diagram/findings for  description). Left ventricular diastolic parameters are indeterminate. There is mild hypokinesis of the left ventricular, basal inferior segment.  2. Pulmonary artery pressure is at least mildly elevated (RVSP 30-35 mmHg plus central venous/right atrial pressure). Right ventricular systolic function is normal. The right ventricular size is normal.  3. Left atrial size was moderately dilated.  4. The mitral valve is normal in structure. Mild to moderate mitral valve regurgitation.  5. Tricuspid valve regurgitation is moderate.  6. The aortic valve is tricuspid. There is mild thickening of the aortic valve. Aortic valve regurgitation is mild. Aortic valve sclerosis is present, with no evidence of aortic valve stenosis. FINDINGS  Left Ventricle: Left ventricular ejection fraction, by estimation, is 55 to 60%. The left ventricle has normal function. The left ventricle demonstrates regional wall motion abnormalities. Mild hypokinesis of the left ventricular, basal inferior segment. The left ventricular internal cavity size was normal in size. There is borderline left ventricular hypertrophy. Left ventricular diastolic parameters are indeterminate. Right Ventricle: Pulmonary artery pressure is at least mildly elevated (RVSP 30-35 mmHg plus central venous/right atrial pressure). The right ventricular size is normal. No increase in right ventricular wall thickness. Right ventricular systolic function  is normal. Left Atrium: Left atrial size was moderately dilated. Right Atrium: Right atrial size was normal in size. Pericardium: There is no evidence of pericardial effusion. Mitral Valve: The mitral valve is normal in structure. Mild mitral annular calcification. Mild to moderate mitral valve regurgitation. Tricuspid Valve: The tricuspid valve is normal in structure. Tricuspid valve regurgitation is moderate. Aortic Valve: The aortic valve is tricuspid. There is mild thickening of the aortic valve. Aortic valve regurgitation  is mild. Aortic valve sclerosis is present, with no evidence of aortic valve stenosis. Pulmonic Valve: The pulmonic valve was not well visualized. Pulmonic valve regurgitation is trivial. No evidence of pulmonic stenosis. Aorta: The aortic root is normal in size and structure. Pulmonary Artery: The pulmonary artery is not well seen. Venous: The inferior vena cava was not  well visualized. IAS/Shunts: The interatrial septum was not well visualized.  LEFT VENTRICLE PLAX 2D LVIDd:         5.10 cm      Diastology LVIDs:         3.10 cm      LV e' medial:    6.40 cm/s LV PW:         1.00 cm      LV E/e' medial:  11.7 LV IVS:        1.10 cm      LV e' lateral:   7.53 cm/s LVOT diam:     2.20 cm      LV E/e' lateral: 9.9 LV SV:         74 LV SV Index:   38 LVOT Area:     3.80 cm  LV Volumes (MOD) LV vol d, MOD A2C: 120.4 ml LV vol d, MOD A4C: 84.4 ml LV vol s, MOD A2C: 39.1 ml LV vol s, MOD A4C: 36.1 ml LV SV MOD A2C:     81.3 ml LV SV MOD A4C:     84.4 ml LV SV MOD BP:      63.1 ml RIGHT VENTRICLE RV Basal diam:  4.10 cm RV S prime:     12.70 cm/s TAPSE (M-mode): 3.2 cm LEFT ATRIUM             Index        RIGHT ATRIUM           Index LA diam:        5.00 cm 2.57 cm/m   RA Area:     13.90 cm LA Vol (A2C):   72.5 ml 37.23 ml/m  RA Volume:   29.60 ml  15.20 ml/m LA Vol (A4C):   87.8 ml 45.09 ml/m LA Biplane Vol: 79.1 ml 40.62 ml/m  AORTIC VALVE LVOT Vmax:   68.30 cm/s LVOT Vmean:  50.050 cm/s LVOT VTI:    0.196 m  AORTA Ao Root diam: 3.70 cm MITRAL VALVE               TRICUSPID VALVE MV Area (PHT): 2.36 cm    TR Peak grad:   32.7 mmHg MV Decel Time: 322 msec    TR Vmax:        286.00 cm/s MV E velocity: 74.90 cm/s MV A velocity: 79.70 cm/s  SHUNTS MV E/A ratio:  0.94        Systemic VTI:  0.20 m                            Systemic Diam: 2.20 cm Yvonne Kendall MD Electronically signed by Yvonne Kendall MD Signature Date/Time: 10/19/2022/7:20:36 AM    Final    DG Chest Port 1 View  Result Date: 10/18/2022 CLINICAL  DATA:  Passing out on and off for the past month or so. Bradycardia EXAM: PORTABLE CHEST 1 VIEW COMPARISON:  None FINDINGS: Borderline cardiomegaly. Aortic atherosclerotic calcification. Defibrillator pads overlie the left chest. Left basilar atelectasis/scarring. Otherwise no focal consolidation, pleural effusion, or pneumothorax. No displaced rib fractures. IMPRESSION: No active disease. Electronically Signed   By: Minerva Fester M.D.   On: 10/18/2022 19:58    Cardiac Studies   Echo   1. Left ventricular ejection fraction, by estimation, is 55 to 60%. The  left ventricle has normal function. The left ventricle demonstrates  regional wall motion abnormalities (see scoring diagram/findings for  description). Left ventricular diastolic  parameters are indeterminate. There is mild hypokinesis of the left  ventricular, basal inferior segment.   2. Pulmonary artery pressure is at least mildly elevated (RVSP 30-35 mmHg  plus central venous/right atrial pressure). Right ventricular systolic  function is normal. The right ventricular size is normal.   3. Left atrial size was moderately dilated.   4. The mitral valve is normal in structure. Mild to moderate mitral valve  regurgitation.   5. Tricuspid valve regurgitation is moderate.   6. The aortic valve is tricuspid. There is mild thickening of the aortic  valve. Aortic valve regurgitation is mild. Aortic valve sclerosis is  present, with no evidence of aortic valve stenosis.   Patient Profile     Thomas [name] is a 76 y.o. male with a hx of paroxysmal atrial fibrillation, PACs/PVCs, hypertension, and hyperlipidemia, who is being seen 10/18/2022 for the evaluation of heart block and syncope   Assessment & Plan    AV block and syncope: Reports 2 episodes of syncope, 1 remote, most recent 1 was 6 weeks ago Presenting with near syncope and syncope symptoms noted to have sinus bradycardia and 2:1 AV block with ventricular rates into the 20s.  In  the ER  Echocardiogram with normal LV function --atenolol and diltiazem held  -Atropine as needed for symptomatic bradycardia  -Need additional 24 to 36-hour washout before making any decision on pacemaker   Hypertension: Atenolol and diltiazem held Other blood pressure medications held Blood pressure somewhat labile 110 systolic up to 150, will continue to monitor   Paroxysmal atrial fibrillation: No evidence of atrial fibrillation on monitoring -Atenolol and diltiazem on hold For recurrent atrial fibrillation, would need to involve EP   Troponin elevation: Most likely due to supply-demand mismatch in the setting of marked bradycardia.  -Normal ejection fraction on echo, no plans at this time for ischemic workup  Long discussion with him concerning recent events, medication side effects, plan  Total encounter time more than 50 minutes  Greater than 50% was spent in counseling and coordination of care with the patient   For questions or updates, please contact [facility] Please consult [email or web address] for contact info under        Signed, Julien Nordmann, MD  10/19/2022, 10:57 AM
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
## 2022-10-19 NOTE — Progress Notes (Addendum)
PROGRESS NOTE    Thomas [name]   [name] DOB: Apr 24, 1946  DOA: 10/18/2022 Date of Service: 10/19/22 PCP: Smitty Cords, DO     Brief Narrative / Hospital Course:  Thomas [name] is a 77 y.o. male with medical history significant of paroxysmal atrial fibrillation.  Patient is on chronic atenolol therapy.  Patient was in his usual state of health till about a month ago when 2 episodes LOC (last about 5 days prior to ED arrival) without any symptoms, and w/ prompt return to neurologic baseline. Notes HR 20s on occasion w/ self-monitoring. Sent to ED.  06/18: to ED. Cardiology consult, stopping beta blocker  06/19: remains bradycardic, additional 24 to 36-hour washout before making any decision on pacemaker. No concerns on Echo.   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  Troponins are flat.   Telemetry Atropine if symptomatic bradycardia  atenolol will be held, diltiazem will be held.  additional 24 to 36-hour washout before making any decision on pacemaker  Echo no concerns If not improving will need EP to see tomorrow per Dr End Lyme serology with reflex.   Troponin I above reference range likely supply-demand mismatch due to the bradycardia itself and not suggestive of acute coronary syndrome at this time.  Echo - Normal ejection fraction on echo, no plans at this time for ischemic workup   Atrial fibrillation, chronic, paroxysmal (HCC) apixaban will be held given possibility of patient requiring invasive procedures.   Rate controlled medications are being held  as above.   Heparin per pharmacy   HLD Statin    DVT prophylaxis: heparin  Pertinent IV fluids/nutrition: no continuous IV fluids  Central lines / invasive devices: none   Code Status: FULL CODE ACP documentation reviewed: 10/19/22 none on file  Current Admission Status: inpatient  TOC needs / Dispo plan: anticiapte d/c home, no TOC needs at this time  Barriers to discharge / significant pending items: beta blocker washout as above, may need pacemaker depending on HR, anticipate will be here another 2-4 days              Subjective / Brief ROS:  Patient reports no concerns  Denies CP/SOB.  Pain controlled.  Denies new weakness.  Tolerating diet.  Reports no concerns w/ urination/defecation.   Family Communication: wife at bedside on rounds     Objective Findings:  Vitals:   10/19/22 0256 10/19/22 0737 10/19/22 1144 10/19/22 1534  BP: (!) 153/60 (!) 142/69 (!) 157/71 (!) 147/81  Pulse: (!) 32 (!) 33 (!) 50 (!) 42  Resp: 18 18 18 18   Temp: 97.9 F (36.6 C) 98.1 F (36.7 C) 98.2 F (36.8 C) 98.3 F (36.8 C)  TempSrc:      SpO2: 99% 96% 95% 98%  Weight: 78.2 kg     Height:        Intake/Output Summary (Last 24 hours) at 10/19/2022 1544 Last data filed at 10/19/2022 1058 Gross per 24 hour  Intake 760.83 ml  Output 625 ml  Net 135.83 ml   [employer]   10/18/22  1606 10/19/22 0256  Weight: 78 kg 78.2 kg    Examination:  Physical Exam Constitutional:      General: He is not in acute distress. Cardiovascular:     Rate and Rhythm: Regular rhythm. Bradycardia present.  Pulmonary:     Effort: Pulmonary effort is normal. No respiratory distress.     Breath sounds: Normal breath sounds.  Abdominal:     General: Abdomen is flat.     Palpations: Abdomen is soft.  Skin:    General: Skin is warm and dry.  Neurological:     General: No focal deficit present.     Mental Status: He is alert and oriented to person, place, and time. Mental status is at  baseline.  Psychiatric:        Mood and Affect: Mood normal.        Behavior: Behavior normal.          Scheduled Medications:   rosuvastatin  5 mg Oral Q48H   sodium chloride flush  3 mL Intravenous Q12H    Continuous Infusions:  heparin 1,000 Units/hr (10/19/22 0701)    PRN Medications:  acetaminophen **OR** acetaminophen, hydrALAZINE, polyethylene glycol  Antimicrobials from admission:  Anti-infectives (From admission, onward)    None           Data Reviewed:  I have personally reviewed the following...  CBC: Recent Labs  Lab 10/18/22 1624 10/19/22 0607  WBC 6.2 4.5  HGB 13.4 12.2*  HCT 39.3 35.4*  MCV 95.9 95.2  PLT 205 182   Basic Metabolic Panel: Recent Labs  Lab 10/18/22 1624 10/19/22 0607  NA 133* 134*  K 4.3 4.1  CL 102 102  CO2 22 23  GLUCOSE 97 97  BUN 24* 18  CREATININE 1.09 0.86  CALCIUM 8.7* 8.3*  MG 2.3  --    GFR: Estimated Creatinine Clearance: 72 mL/min (by C-G formula based on SCr of 0.86 mg/dL). Liver Function Tests: Recent Labs  Lab 10/18/22 1624  AST 19  ALT 15  ALKPHOS 43  BILITOT 1.0  PROT 7.6  ALBUMIN 3.2*   No results for input(s): "LIPASE", "AMYLASE" in the last 168 hours. No results for input(s): "AMMONIA" in the last 168 hours. Coagulation Profile: Recent Labs  Lab 10/18/22 2051  INR 1.4*   Cardiac Enzymes: No results for input(s): "CKTOTAL", "CKMB", "CKMBINDEX", "TROPONINI" in the last 168 hours. BNP (last 3 results) No results for input(s): "PROBNP" in the last 8760 hours. HbA1C: No results for input(s): "HGBA1C" in the last 72 hours. CBG: No results for input(s): "GLUCAP" in the last 168 hours. Lipid Profile: No results for input(s): "CHOL", "HDL", "LDLCALC", "TRIG", "CHOLHDL", "LDLDIRECT" in the last 72 hours. Thyroid Function Tests: Recent Labs    10/18/22 1624  TSH 1.443   Anemia Panel: No results for input(s): "VITAMINB12", "FOLATE", "FERRITIN", "TIBC", "IRON", "RETICCTPCT" in  the last 72 hours. Most Recent Urinalysis On File:     Component Value Date/Time   COLORURINE YELLOW (A) 10/18/2022 1605   APPEARANCEUR CLEAR (A) 10/18/2022 1605   LABSPEC 1.019 10/18/2022 1605   PHURINE 5.0 10/18/2022 1605   GLUCOSEU NEGATIVE 10/18/2022 1605   HGBUR NEGATIVE 10/18/2022 1605   BILIRUBINUR NEGATIVE 10/18/2022 1605   KETONESUR NEGATIVE 10/18/2022 1605   PROTEINUR NEGATIVE 10/18/2022 1605   NITRITE NEGATIVE 10/18/2022 1605   LEUKOCYTESUR NEGATIVE 10/18/2022 1605   Sepsis Labs: @LABRCNTIP (procalcitonin:4,lacticidven:4) Microbiology: No results found for this or any previous visit (from the past 240 hour(s)).    Radiology  Studies last 3 days: ECHOCARDIOGRAM COMPLETE  Result Date: 10/19/2022    ECHOCARDIOGRAM REPORT   Patient Name:   Thomas [name] [place] Date of Exam: 10/18/2022 Medical Rec #:  [id number]     Height:       69.5 in Accession #:    [id number]    Weight:       172.0 lb Date of Birth:  08/10/1945     BSA:          1.947 m Patient Age:    27 years      BP:           178/72 mmHg Patient Gender: M             HR:           49 bpm. Exam Location:  [facility] Procedure: 2D Echo, Cardiac Doppler and Color Doppler Indications:     R55 Syncope  History:         Patient has prior history of Echocardiogram examinations, most                  recent 11/04/2015. Arrythmias:Paroxysmal Atrial Fibrillation;                  Risk Factors:Hypertension and Dyslipidemia. Palpitations.  Sonographer:     Daphine Deutscher RDCS Referring Phys:  [id number] CHRISTOPHER END Diagnosing Phys: Yvonne Kendall MD IMPRESSIONS  1. Left ventricular ejection fraction, by estimation, is 55 to 60%. The left ventricle has normal function. The left ventricle demonstrates regional wall motion abnormalities (see scoring diagram/findings for description). Left ventricular diastolic parameters are indeterminate. There is mild hypokinesis of the left ventricular, basal inferior segment.  2. Pulmonary artery pressure is at  least mildly elevated (RVSP 30-35 mmHg plus central venous/right atrial pressure). Right ventricular systolic function is normal. The right ventricular size is normal.  3. Left atrial size was moderately dilated.  4. The mitral valve is normal in structure. Mild to moderate mitral valve regurgitation.  5. Tricuspid valve regurgitation is moderate.  6. The aortic valve is tricuspid. There is mild thickening of the aortic valve. Aortic valve regurgitation is mild. Aortic valve sclerosis is present, with no evidence of aortic valve stenosis. FINDINGS  Left Ventricle: Left ventricular ejection fraction, by estimation, is 55 to 60%. The left ventricle has normal function. The left ventricle demonstrates regional wall motion abnormalities. Mild hypokinesis of the left ventricular, basal inferior segment. The left ventricular internal cavity size was normal in size. There is borderline left ventricular hypertrophy. Left ventricular diastolic parameters are indeterminate. Right Ventricle: Pulmonary artery pressure is at least mildly elevated (RVSP 30-35 mmHg plus central venous/right atrial pressure). The right ventricular size is normal. No increase in right ventricular wall thickness. Right ventricular systolic function  is normal. Left Atrium: Left atrial size was moderately dilated. Right Atrium: Right atrial size was normal in size. Pericardium: There is no evidence of pericardial effusion. Mitral Valve: The mitral valve is normal in structure. Mild mitral annular calcification. Mild to moderate mitral valve regurgitation. Tricuspid Valve: The tricuspid valve is normal in structure. Tricuspid valve regurgitation is moderate. Aortic Valve: The aortic valve is tricuspid. There is mild thickening of the aortic valve. Aortic valve regurgitation is mild. Aortic valve sclerosis is present, with no evidence of aortic valve stenosis. Pulmonic Valve: The pulmonic valve was not well visualized. Pulmonic valve regurgitation is  trivial. No evidence of pulmonic stenosis. Aorta: The aortic root is normal in size and structure. Pulmonary Artery:  The pulmonary artery is not well seen. Venous: The inferior vena cava was not well visualized. IAS/Shunts: The interatrial septum was not well visualized.  LEFT VENTRICLE PLAX 2D LVIDd:         5.10 cm      Diastology LVIDs:         3.10 cm      LV e' medial:    6.40 cm/s LV PW:         1.00 cm      LV E/e' medial:  11.7 LV IVS:        1.10 cm      LV e' lateral:   7.53 cm/s LVOT diam:     2.20 cm      LV E/e' lateral: 9.9 LV SV:         74 LV SV Index:   38 LVOT Area:     3.80 cm  LV Volumes (MOD) LV vol d, MOD A2C: 120.4 ml LV vol d, MOD A4C: 84.4 ml LV vol s, MOD A2C: 39.1 ml LV vol s, MOD A4C: 36.1 ml LV SV MOD A2C:     81.3 ml LV SV MOD A4C:     84.4 ml LV SV MOD BP:      63.1 ml RIGHT VENTRICLE RV Basal diam:  4.10 cm RV S prime:     12.70 cm/s TAPSE (M-mode): 3.2 cm LEFT ATRIUM             Index        RIGHT ATRIUM           Index LA diam:        5.00 cm 2.57 cm/m   RA Area:     13.90 cm LA Vol (A2C):   72.5 ml 37.23 ml/m  RA Volume:   29.60 ml  15.20 ml/m LA Vol (A4C):   87.8 ml 45.09 ml/m LA Biplane Vol: 79.1 ml 40.62 ml/m  AORTIC VALVE LVOT Vmax:   68.30 cm/s LVOT Vmean:  50.050 cm/s LVOT VTI:    0.196 m  AORTA Ao Root diam: 3.70 cm MITRAL VALVE               TRICUSPID VALVE MV Area (PHT): 2.36 cm    TR Peak grad:   32.7 mmHg MV Decel Time: 322 msec    TR Vmax:        286.00 cm/s MV E velocity: 74.90 cm/s MV A velocity: 79.70 cm/s  SHUNTS MV E/A ratio:  0.94        Systemic VTI:  0.20 m                            Systemic Diam: 2.20 cm Yvonne Kendall MD Electronically signed by Yvonne Kendall MD Signature Date/Time: 10/19/2022/7:20:36 AM    Final    DG Chest Port 1 View  Result Date: 10/18/2022 CLINICAL DATA:  Passing out on and off for the past month or so. Bradycardia EXAM: PORTABLE CHEST 1 VIEW COMPARISON:  None FINDINGS: Borderline cardiomegaly. Aortic atherosclerotic  calcification. Defibrillator pads overlie the left chest. Left basilar atelectasis/scarring. Otherwise no focal consolidation, pleural effusion, or pneumothorax. No displaced rib fractures. IMPRESSION: No active disease. Electronically Signed   By: Minerva Fester M.D.   On: 10/18/2022 19:58             LOS: 1 day      Sunnie Nielsen, DO [facility] 10/19/2022, 3:44 PM  Dictation software may have been used to generate the above note. Typos may occur and escape review in typed/dictated notes. Please contact Dr Lyn Hollingshead directly for clarity if needed.  Staff may message me via secure chat in Epic  but this may not receive an immediate response,  please page me for urgent matters!  If 7PM-7AM, please contact night coverage [email or web address]

## 2022-10-19 NOTE — Progress Notes (Signed)
Patient revaluated after HR reported in 30 by RN. Patient is alert, not having any presyncope, chest pain, sob. Appears to be fully coherent. HR is consistetn with that noted in the ER earlier. Patient ordered for a single dose of atropine, will see if HR responds to same. C.w  BP monitoring.
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
## 2022-10-20 ENCOUNTER — Inpatient Hospital Stay (HOSPITAL_COMMUNITY)
Admit: 2022-10-20 | Discharge: 2022-10-21 | DRG: 243 | Disposition: A | Discharge status: Home / Self Care | Payer: Medicare HMO | Source: Other Acute Inpatient Hospital | Attending: Cardiology | Admitting: Cardiology

## 2022-10-20 ENCOUNTER — Other Ambulatory Visit: Payer: Self-pay

## 2022-10-20 ENCOUNTER — Encounter (HOSPITAL_COMMUNITY): Payer: Self-pay

## 2022-10-20 DIAGNOSIS — N400 Enlarged prostate without lower urinary tract symptoms: Secondary | ICD-10-CM | POA: Diagnosis present

## 2022-10-20 DIAGNOSIS — Z888 Allergy status to other drugs, medicaments and biological substances status: Secondary | ICD-10-CM | POA: Diagnosis not present

## 2022-10-20 DIAGNOSIS — R0609 Other forms of dyspnea: Secondary | ICD-10-CM | POA: Diagnosis not present

## 2022-10-20 DIAGNOSIS — Z79899 Other long term (current) drug therapy: Secondary | ICD-10-CM

## 2022-10-20 DIAGNOSIS — Z823 Family history of stroke: Secondary | ICD-10-CM

## 2022-10-20 DIAGNOSIS — R55 Syncope and collapse: Secondary | ICD-10-CM | POA: Diagnosis present

## 2022-10-20 DIAGNOSIS — Z7983 Long term (current) use of bisphosphonates: Secondary | ICD-10-CM | POA: Diagnosis not present

## 2022-10-20 DIAGNOSIS — Z833 Family history of diabetes mellitus: Secondary | ICD-10-CM

## 2022-10-20 DIAGNOSIS — Z880 Allergy status to penicillin: Secondary | ICD-10-CM

## 2022-10-20 DIAGNOSIS — N138 Other obstructive and reflux uropathy: Secondary | ICD-10-CM | POA: Diagnosis not present

## 2022-10-20 DIAGNOSIS — R001 Bradycardia, unspecified: Secondary | ICD-10-CM | POA: Diagnosis not present

## 2022-10-20 DIAGNOSIS — Z87891 Personal history of nicotine dependence: Secondary | ICD-10-CM

## 2022-10-20 DIAGNOSIS — I480 Paroxysmal atrial fibrillation: Secondary | ICD-10-CM | POA: Diagnosis not present

## 2022-10-20 DIAGNOSIS — I10 Essential (primary) hypertension: Secondary | ICD-10-CM | POA: Diagnosis present

## 2022-10-20 DIAGNOSIS — Z8249 Family history of ischemic heart disease and other diseases of the circulatory system: Secondary | ICD-10-CM | POA: Diagnosis not present

## 2022-10-20 DIAGNOSIS — N401 Enlarged prostate with lower urinary tract symptoms: Secondary | ICD-10-CM | POA: Diagnosis not present

## 2022-10-20 DIAGNOSIS — E785 Hyperlipidemia, unspecified: Secondary | ICD-10-CM | POA: Diagnosis not present

## 2022-10-20 DIAGNOSIS — Z83438 Family history of other disorder of lipoprotein metabolism and other lipidemia: Secondary | ICD-10-CM | POA: Diagnosis not present

## 2022-10-20 DIAGNOSIS — I441 Atrioventricular block, second degree: Secondary | ICD-10-CM | POA: Diagnosis not present

## 2022-10-20 DIAGNOSIS — Z7901 Long term (current) use of anticoagulants: Secondary | ICD-10-CM

## 2022-10-20 LAB — CBC
HCT: 35.5 % — ABNORMAL LOW (ref 39.0–52.0)
Hemoglobin: 12.2 g/dL — ABNORMAL LOW (ref 13.0–17.0)
MCH: 32.8 pg (ref 26.0–34.0)
MCHC: 34.4 g/dL (ref 30.0–36.0)
MCV: 95.4 fL (ref 80.0–100.0)
Platelets: 195 10*3/uL (ref 150–400)
RBC: 3.72 MIL/uL — ABNORMAL LOW (ref 4.22–5.81)
RDW: 13.5 % (ref 11.5–15.5)
WBC: 6.1 10*3/uL (ref 4.0–10.5)
nRBC: 0 % (ref 0.0–0.2)

## 2022-10-20 LAB — SURGICAL PCR SCREEN
MRSA, PCR: NEGATIVE
Staphylococcus aureus: NEGATIVE

## 2022-10-20 LAB — HEPARIN LEVEL (UNFRACTIONATED): Heparin Unfractionated: 0.52 IU/mL (ref 0.30–0.70)

## 2022-10-20 LAB — APTT: aPTT: 73 seconds — ABNORMAL HIGH (ref 24–36)

## 2022-10-20 LAB — LYME DISEASE SEROLOGY W/REFLEX: Lyme Total Antibody EIA: NEGATIVE

## 2022-10-20 MED ORDER — ACETAMINOPHEN 325 MG PO TABS: 650.0000 mg | ORAL_TABLET | ORAL | Status: DC | PRN

## 2022-10-20 MED ORDER — SODIUM CHLORIDE 0.9 % IV SOLN
80.0000 mg | INTRAVENOUS | Status: AC
  Administered 2022-10-21: 80 mg
  Filled 2022-10-20: qty 2

## 2022-10-20 MED ORDER — ONDANSETRON HCL 4 MG/2ML IJ SOLN: 4.0000 mg | Freq: Four times a day (QID) | INTRAMUSCULAR | Status: DC | PRN

## 2022-10-20 MED ORDER — HYDRALAZINE HCL 20 MG/ML IJ SOLN: 5.0000 mg | Freq: Four times a day (QID) | INTRAMUSCULAR | Status: DC | PRN

## 2022-10-20 MED ORDER — IRBESARTAN 150 MG PO TABS
150.0000 mg | ORAL_TABLET | Freq: Every day | ORAL | Status: DC
  Administered 2022-10-20: 150 mg via ORAL
  Filled 2022-10-20: qty 1

## 2022-10-20 MED ORDER — SODIUM CHLORIDE 0.9 % IV SOLN: INTRAVENOUS | Status: DC

## 2022-10-20 MED ORDER — IRBESARTAN 300 MG PO TABS
150.0000 mg | ORAL_TABLET | Freq: Every day | ORAL | Status: DC
  Administered 2022-10-21: 150 mg via ORAL
  Filled 2022-10-20: qty 1

## 2022-10-20 MED ORDER — ATROPINE SULFATE 1 MG/10ML IJ SOSY: 0.5000 mg | PREFILLED_SYRINGE | INTRAMUSCULAR | Status: DC | PRN

## 2022-10-20 MED ORDER — ROSUVASTATIN CALCIUM 5 MG PO TABS: 5.0000 mg | ORAL_TABLET | ORAL | Status: DC

## 2022-10-20 MED ORDER — CEFAZOLIN SODIUM-DEXTROSE 2-4 GM/100ML-% IV SOLN
2.0000 g | INTRAVENOUS | Status: DC
  Filled 2022-10-20: qty 100

## 2022-10-20 MED ORDER — HEPARIN (PORCINE) 25000 UT/250ML-% IV SOLN
1000.0000 [IU]/h | INTRAVENOUS | Status: DC
  Administered 2022-10-20: 1000 [IU]/h via INTRAVENOUS
  Filled 2022-10-20: qty 250

## 2022-10-20 NOTE — Plan of Care (Signed)
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
## 2022-10-20 NOTE — Progress Notes (Addendum)
Patient Name: Thomas [name] Date of Encounter: 10/21/2022  Primary Cardiologist: Julien Nordmann, MD Electrophysiologist: None  Interval Summary   The patient is doing well today.  At this time, the patient denies chest pain, shortness of breath, or any new concerns.  Inpatient Medications    Scheduled Meds:  gentamicin (GARAMYCIN) 80 mg in sodium chloride 0.9 % 500 mL irrigation  80 mg Irrigation On Call   irbesartan  150 mg Oral Daily   rosuvastatin  5 mg Oral Q48H   Continuous Infusions:  sodium chloride 50 mL/hr at 10/21/22 0640   sodium chloride 50 mL/hr at 10/21/22 0640    ceFAZolin (ANCEF) IV     PRN Meds: acetaminophen, atropine, hydrALAZINE, ondansetron (ZOFRAN) IV   Vital Signs    Vitals:   10/20/22 1700 10/20/22 1934 10/21/22 0016 10/21/22 0600  BP: (!) 180/68 (!) 171/67 (!) 169/65 119/60  Pulse: 77 (!) 39 (!) 40   Resp: 20 18 17 18   Temp: 98 F (36.7 C) (!) 97.4 F (36.3 C) 97.9 F (36.6 C) 97.6 F (36.4 C)  TempSrc: Oral Oral Oral Oral  SpO2: 97% 98% 98% 96%  Weight:  78.2 kg    Height:  5\' 9"  (1.753 m)      Intake/Output Summary (Last 24 hours) at 10/21/2022 0711 Last data filed at 10/21/2022 0640 Gross per 24 hour  Intake 361.59 ml  Output 700 ml  Net -338.41 ml   Filed Weights   10/20/22 1934  Weight: 78.2 kg    Physical Exam    GEN- The patient is well appearing, alert and oriented x 3 today.   Lungs- Clear to ausculation bilaterally, normal work of breathing Cardiac- SLow, regular rate and rhythm, no murmurs, rubs or gallops GI- soft, NT, ND, + BS Extremities- no clubbing or cyanosis. No edema  Telemetry    2:1 AV block 30s (personally reviewed)  Hospital Course    Thomas [name] is an 77 year old man who is being admitted as a transfer from [facility] for syncope and bradycardia. He has a medical history that includes atrial fibrillation on Eliquis, frequent PACs and PVCs. He presented to the hospital June 18 with bradycardia.  The patient reports syncopal episode 2 times prior to hospital admission. Each time he would return to baseline quickly. He was previously on atenolol. In the ER he was found to be bradycardic with second-degree AV block. He was admitted for pacemaker. His last dose of atenolol was June 18.   Assessment & Plan    Symptomatic bradycardia 2:1 AV block Symptomatic with recurrent syncope. Continues to have 2:1 AV block Echo 6/18 LVEF 55-60% Explained risks, benefits, and alternatives to PPM implantation, including but not limited to bleeding, infection, pneumothorax, pericardial effusion, lead dislodgement, heart attack, stroke, or death.  Pt verbalized understanding and agrees to proceed.   For questions or updates, please contact [facility] Please consult [email or web address] for contact info under Cardiology/STEMI.  Signed, Graciella Freer, PA-C  10/21/2022, 7:11 AM     2 AVB   Syncope-recurrent  RBBB LAD  HTN  DOE  The patient has persistent heart block following the weaning of atenolol with prior history of syncope b in the context of underlying bifascicular block.  Pacing is indicated for the relief of symptoms.  Over the last months he has had increasing exercise intolerance and lassitude.  This should not be explained by intermittent heart block, he will require further evaluation; he has normal LV function, he was noted  on x-ray to have aortic atherosclerosis, CT angio would be appropriate  Blood pressure is poorly controlled, we will anticipate more aggressive medications following device implantation  The benefits and risks were reviewed including but not limited to death,  perforation, infection, lead dislodgement and device malfunction.  The patient understands agrees and is willing to proceed.

## 2022-10-20 NOTE — Progress Notes (Addendum)
Rounding Note    Patient Name: Thomas [name] Date of Encounter: 10/20/2022   [facility] Cardiologist: Julien Nordmann, MD   Subjective   Ambulating on the unit today, asymptomatic Denies near syncope, dizziness, orthostasis symptoms Review of telemetry shows 2-1 AV block heart rates in the 30s, Alternating with sinus rhythm rate in the high 50s Episode of atrial fibrillation rate in the high 50s from 2:15 PM until 6:30 PM yesterday June 19, he was unaware  Inpatient Medications    Scheduled Meds:  rosuvastatin  5 mg Oral Q48H   sodium chloride flush  3 mL Intravenous Q12H   Continuous Infusions:  heparin 1,000 Units/hr (10/19/22 2018)   PRN Meds: acetaminophen **OR** acetaminophen, hydrALAZINE, polyethylene glycol   Vital Signs    Vitals:   10/20/22 0026 10/20/22 0413 10/20/22 0731 10/20/22 1151  BP: (!) 121/57 (!) 153/60 (!) 152/59 (!) 156/71  Pulse: (!) 32 (!) 32 (!) 34 (!) 54  Resp: 16 20 16 16   Temp: 97.8 F (36.6 C) 97.8 F (36.6 C) 98.3 F (36.8 C) 98.4 F (36.9 C)  TempSrc:  Oral    SpO2: 97% 98% 98% 98%  Weight:      Height:        Intake/Output Summary (Last 24 hours) at 10/20/2022 1227 Last data filed at 10/20/2022 0731 Gross per 24 hour  Intake 0 ml  Output 850 ml  Net -850 ml      10/19/2022    2:56 AM 10/18/2022    4:06 PM 10/13/2022    8:29 AM  Last 3 Weights  Weight (lbs) 172 lb 6.4 oz 172 lb 179 lb  Weight (kg) 78.2 kg 78.019 kg 81.194 kg      Telemetry     Telemetry showing normal sinus rhythm with frequent episodes of 2-1 AV block rates in the 30s- Personally Reviewed  ECG     - Personally Reviewed  Physical Exam   Constitutional:  oriented to person, place, and time. No distress.  HENT:  Head: Grossly normal Eyes:  no discharge. No scleral icterus.  Neck: No JVD, no carotid bruits  Cardiovascular: Regular rate and rhythm, no murmurs appreciated Pulmonary/Chest: Clear to auscultation bilaterally, no wheezes or  rails Abdominal: Soft.  no distension.  no tenderness.  Musculoskeletal: Normal range of motion Neurological:  normal muscle tone. Coordination normal. No atrophy Skin: Skin warm and dry Psychiatric: normal affect, pleasant  Labs    High Sensitivity Troponin:   Recent Labs  Lab 10/18/22 1631 10/18/22 1848  TROPONINIHS 26* 25*     Chemistry Recent Labs  Lab 10/18/22 1624 10/19/22 0607  NA 133* 134*  K 4.3 4.1  CL 102 102  CO2 22 23  GLUCOSE 97 97  BUN 24* 18  CREATININE 1.09 0.86  CALCIUM 8.7* 8.3*  MG 2.3  --   PROT 7.6  --   ALBUMIN 3.2*  --   AST 19  --   ALT 15  --   ALKPHOS 43  --   BILITOT 1.0  --   GFRNONAA >60 >60  ANIONGAP 12 9    Lipids No results for input(s): "CHOL", "TRIG", "HDL", "LABVLDL", "LDLCALC", "CHOLHDL" in the last 168 hours.  Hematology Recent Labs  Lab 10/18/22 1624 10/19/22 0607 10/20/22 0351  WBC 6.2 4.5 6.1  RBC 4.10* 3.72* 3.72*  HGB 13.4 12.2* 12.2*  HCT 39.3 35.4* 35.5*  MCV 95.9 95.2 95.4  MCH 32.7 32.8 32.8  MCHC 34.1 34.5 34.4  RDW 13.9 13.7 13.5  PLT 205 182 195   Thyroid  Recent Labs  Lab 10/18/22 1624  TSH 1.443    BNPNo results for input(s): "BNP", "PROBNP" in the last 168 hours.  DDimer No results for input(s): "DDIMER" in the last 168 hours.   Radiology    ECHOCARDIOGRAM COMPLETE  Result Date: 10/19/2022    ECHOCARDIOGRAM REPORT   Patient Name:   Thomas [name] Date of Exam: 10/18/2022 Medical Rec #:  [id number]     Height:       69.5 in Accession #:    [id number]    Weight:       172.0 lb Date of Birth:  [birth year]     BSA:          1.947 m Patient Age:    77 years      BP:           178/72 mmHg Patient Gender: M             HR:           49 bpm. Exam Location:  [facility] Procedure: 2D Echo, Cardiac Doppler and Color Doppler Indications:     R55 Syncope  History:         Patient has prior history of Echocardiogram examinations, most                  recent 11/04/2015. Arrythmias:Paroxysmal Atrial Fibrillation;                   Risk Factors:Hypertension and Dyslipidemia. Palpitations.  Sonographer:     Daphine Deutscher RDCS Referring Phys:  [id number] CHRISTOPHER END Diagnosing Phys: Yvonne Kendall MD IMPRESSIONS  1. Left ventricular ejection fraction, by estimation, is 55 to 60%. The left ventricle has normal function. The left ventricle demonstrates regional wall motion abnormalities (see scoring diagram/findings for description). Left ventricular diastolic parameters are indeterminate. There is mild hypokinesis of the left ventricular, basal inferior segment.  2. Pulmonary artery pressure is at least mildly elevated (RVSP 30-35 mmHg plus central venous/right atrial pressure). Right ventricular systolic function is normal. The right ventricular size is normal.  3. Left atrial size was moderately dilated.  4. The mitral valve is normal in structure. Mild to moderate mitral valve regurgitation.  5. Tricuspid valve regurgitation is moderate.  6. The aortic valve is tricuspid. There is mild thickening of the aortic valve. Aortic valve regurgitation is mild. Aortic valve sclerosis is present, with no evidence of aortic valve stenosis. FINDINGS  Left Ventricle: Left ventricular ejection fraction, by estimation, is 55 to 60%. The left ventricle has normal function. The left ventricle demonstrates regional wall motion abnormalities. Mild hypokinesis of the left ventricular, basal inferior segment. The left ventricular internal cavity size was normal in size. There is borderline left ventricular hypertrophy. Left ventricular diastolic parameters are indeterminate. Right Ventricle: Pulmonary artery pressure is at least mildly elevated (RVSP 30-35 mmHg plus central venous/right atrial pressure). The right ventricular size is normal. No increase in right ventricular wall thickness. Right ventricular systolic function  is normal. Left Atrium: Left atrial size was moderately dilated. Right Atrium: Right atrial size was normal in size.  Pericardium: There is no evidence of pericardial effusion. Mitral Valve: The mitral valve is normal in structure. Mild mitral annular calcification. Mild to moderate mitral valve regurgitation. Tricuspid Valve: The tricuspid valve is normal in structure. Tricuspid valve regurgitation is moderate. Aortic Valve: The aortic valve is tricuspid. There is mild thickening of the  aortic valve. Aortic valve regurgitation is mild. Aortic valve sclerosis is present, with no evidence of aortic valve stenosis. Pulmonic Valve: The pulmonic valve was not well visualized. Pulmonic valve regurgitation is trivial. No evidence of pulmonic stenosis. Aorta: The aortic root is normal in size and structure. Pulmonary Artery: The pulmonary artery is not well seen. Venous: The inferior vena cava was not well visualized. IAS/Shunts: The interatrial septum was not well visualized.  LEFT VENTRICLE PLAX 2D LVIDd:         5.10 cm      Diastology LVIDs:         3.10 cm      LV e' medial:    6.40 cm/s LV PW:         1.00 cm      LV E/e' medial:  11.7 LV IVS:        1.10 cm      LV e' lateral:   7.53 cm/s LVOT diam:     2.20 cm      LV E/e' lateral: 9.9 LV SV:         74 LV SV Index:   38 LVOT Area:     3.80 cm  LV Volumes (MOD) LV vol d, MOD A2C: 120.4 ml LV vol d, MOD A4C: 84.4 ml LV vol s, MOD A2C: 39.1 ml LV vol s, MOD A4C: 36.1 ml LV SV MOD A2C:     81.3 ml LV SV MOD A4C:     84.4 ml LV SV MOD BP:      63.1 ml RIGHT VENTRICLE RV Basal diam:  4.10 cm RV S prime:     12.70 cm/s TAPSE (M-mode): 3.2 cm LEFT ATRIUM             Index        RIGHT ATRIUM           Index LA diam:        5.00 cm 2.57 cm/m   RA Area:     13.90 cm LA Vol (A2C):   72.5 ml 37.23 ml/m  RA Volume:   29.60 ml  15.20 ml/m LA Vol (A4C):   87.8 ml 45.09 ml/m LA Biplane Vol: 79.1 ml 40.62 ml/m  AORTIC VALVE LVOT Vmax:   68.30 cm/s LVOT Vmean:  50.050 cm/s LVOT VTI:    0.196 m  AORTA Ao Root diam: 3.70 cm MITRAL VALVE               TRICUSPID VALVE MV Area (PHT): 2.36 cm     TR Peak grad:   32.7 mmHg MV Decel Time: 322 msec    TR Vmax:        286.00 cm/s MV E velocity: 74.90 cm/s MV A velocity: 79.70 cm/s  SHUNTS MV E/A ratio:  0.94        Systemic VTI:  0.20 m                            Systemic Diam: 2.20 cm Yvonne Kendall MD Electronically signed by Yvonne Kendall MD Signature Date/Time: 10/19/2022/7:20:36 AM    Final    DG Chest Port 1 View  Result Date: 10/18/2022 CLINICAL DATA:  Passing out on and off for the past month or so. Bradycardia EXAM: PORTABLE CHEST 1 VIEW COMPARISON:  None FINDINGS: Borderline cardiomegaly. Aortic atherosclerotic calcification. Defibrillator pads overlie the left chest. Left basilar atelectasis/scarring. Otherwise no focal consolidation, pleural effusion, or pneumothorax.  No displaced rib fractures. IMPRESSION: No active disease. Electronically Signed   By: Minerva Fester M.D.   On: 10/18/2022 19:58    Cardiac Studies   Echo   1. Left ventricular ejection fraction, by estimation, is 55 to 60%. The  left ventricle has normal function. The left ventricle demonstrates  regional wall motion abnormalities (see scoring diagram/findings for  description). Left ventricular diastolic  parameters are indeterminate. There is mild hypokinesis of the left  ventricular, basal inferior segment.   2. Pulmonary artery pressure is at least mildly elevated (RVSP 30-35 mmHg  plus central venous/right atrial pressure). Right ventricular systolic  function is normal. The right ventricular size is normal.   3. Left atrial size was moderately dilated.   4. The mitral valve is normal in structure. Mild to moderate mitral valve  regurgitation.   5. Tricuspid valve regurgitation is moderate.   6. The aortic valve is tricuspid. There is mild thickening of the aortic  valve. Aortic valve regurgitation is mild. Aortic valve sclerosis is  present, with no evidence of aortic valve stenosis.   Patient Profile     Thomas [name] is a 77 y.o. male with a hx  of paroxysmal atrial fibrillation, PACs/PVCs, hypertension, and hyperlipidemia, who is being seen 10/18/2022 for the evaluation of heart block and syncope   Assessment & Plan    AV block and syncope: Reports 2 episodes of syncope, 1 remote, most recent one was 6 weeks ago "passing out off and on for the past month or so " Presenting with near syncope and syncope symptoms noted to have sinus bradycardia and 2:1 AV block with ventricular rates into the 20-30s.   Echocardiogram with normal LV function --atenolol 25 mg held, last dose June 18 in the a.m. (reports he was not taking diltiazem as outpatient) -Atropine as needed for symptomatic bradycardia, currently not having symptoms -EP contacted for further discussion of options   Hypertension: Atenolol and diltiazem held Will start irbesartan 150 mg daily for blood pressure   Paroxysmal atrial fibrillation: -Atenolol and diltiazem on hold Episode of atrial fibrillation yesterday afternoon, 2:15 PM breaking back to normal sinus rhythm 6:30 PM, was asymptomatic On Eliquis 5 twice daily as outpatient, currently on heparin in place of Eliquis   Troponin elevation: Most likely due to supply-demand mismatch in the setting of marked bradycardia.  -Normal ejection fraction on echo, no plans at this time for ischemic workup   Total encounter time more than 35 minutes  Greater than 50% was spent in counseling and coordination of care with the patient    For questions or updates, please contact [facility] Please consult [email or web address] for contact info under        Signed, Julien Nordmann, MD  10/20/2022, 12:27 PM

## 2022-10-20 NOTE — Progress Notes (Addendum)
PROGRESS NOTE    Thomas [name]   Thomas [name]:[id number] DOB: 1946-02-23  DOA: 10/18/2022 Date of Service: 10/20/22 PCP: Thomas Cords, DO     Brief Narrative / Hospital Course:  Thomas [name] is a 77 y.o. male with medical history significant of paroxysmal atrial fibrillation.  Patient is on chronic atenolol therapy.  Patient was in his usual state of health till about a month ago when 2 episodes LOC (last about 5 days prior to ED arrival) without any symptoms, and w/ prompt return to neurologic baseline. Notes HR 20s on occasion w/ self-monitoring. Sent to ED.  06/18: to ED. Cardiology consult, stopping beta blocker  06/19: remains bradycardic, additional 24 to 36-hour washout before making any decision on pacemaker. No concerns on Echo.   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  Troponins are flat.   Telemetry Atropine if symptomatic bradycardia  atenolol will be held, diltiazem will be held.  additional 24 to 36-hour washout before making any decision on pacemaker  Echo no concerns If not improving will need EP to see tomorrow per Thomas [name] serology with reflex.   Troponin I above reference range likely supply-demand mismatch due to the bradycardia itself and not suggestive of acute coronary syndrome at this time.  Echo - Normal ejection fraction on echo, no plans at this time for ischemic workup   Atrial fibrillation, chronic, paroxysmal (HCC) apixaban will be held given possibility of patient requiring invasive procedures.   Rate controlled medications are being held  as above.   Heparin per pharmacy   HLD Statin    DVT prophylaxis: heparin  Pertinent IV fluids/nutrition: no continuous IV fluids  Central lines / invasive devices: none   Code Status: FULL CODE ACP documentation reviewed: 10/19/22 none on file  Current Admission Status: inpatient  TOC needs / Dispo plan: anticiapte d/c home, no TOC needs at this time  Barriers to discharge / significant pending items: beta blocker washout as above, may need pacemaker depending on HR, anticipate will be here another 2-4 days              Subjective / Brief ROS:  Reports no concern except for constipation, no bowel movement in days.  Plan to transfer to [facility] on 10/20/22 for electrophysiology evaluation and possible pacemaker placement.   Family Communication: None at bedside.    Objective Findings:  Vitals:   10/20/22 0731 10/20/22 1151 10/20/22 1526 10/20/22 1549  BP: (!) 152/59 (!) 156/71 (!) 162/65 (!) 162/65  Pulse: (!) 34 (!) 54 (!) 35   Resp: 16 16 20    Temp: 98.3 F (36.8 C) 98.4 F (36.9 C) 98.5 F (36.9 C) 98.5 F (36.9 C)  TempSrc:   Oral Oral  SpO2: 98% 98% 97% 97%  Weight:      Height:        Intake/Output Summary (Last 24 hours) at 10/20/2022 1648 Last data filed at 10/20/2022 0731 Gross per 24 hour  Intake 0 ml  Output 850 ml  Net -850 ml    [employer]  10/18/22 1606 10/19/22 0256  Weight: 78 kg 78.2 kg    Examination:  Physical Exam Constitutional:      General: He is not in acute distress. Cardiovascular:     Rate and Rhythm: Regular rhythm. Bradycardia present.  Pulmonary:     Effort: Pulmonary effort is normal. No respiratory distress.     Breath sounds: Normal breath sounds.  Abdominal:     General: Abdomen is flat.     Palpations: Abdomen is soft.  Skin:    General: Skin is warm and dry.  Neurological:     General: No focal deficit present.     Mental Status: He is alert and oriented to person, place, and time.  Mental status is at baseline.  Psychiatric:        Mood and Affect: Mood normal.        Behavior: Behavior normal.          Scheduled Medications:     Continuous Infusions:    PRN Medications:    Antimicrobials from admission:  Anti-infectives (From admission, onward)    None           Data Reviewed:  I have personally reviewed the following...  CBC: Recent Labs  Lab 10/18/22 1624 10/19/22 0607 10/20/22 0351  WBC 6.2 4.5 6.1  HGB 13.4 12.2* 12.2*  HCT 39.3 35.4* 35.5*  MCV 95.9 95.2 95.4  PLT 205 182 195    Basic Metabolic Panel: Recent Labs  Lab 10/18/22 1624 10/19/22 0607  NA 133* 134*  K 4.3 4.1  CL 102 102  CO2 22 23  GLUCOSE 97 97  BUN 24* 18  CREATININE 1.09 0.86  CALCIUM 8.7* 8.3*  MG 2.3  --     GFR: Estimated Creatinine Clearance: 72 mL/min (by C-G formula based on SCr of 0.86 mg/dL). Liver Function Tests: Recent Labs  Lab 10/18/22 1624  AST 19  ALT 15  ALKPHOS 43  BILITOT 1.0  PROT 7.6  ALBUMIN 3.2*    No results for input(s): "LIPASE", "AMYLASE" in the last 168 hours. No results for input(s): "AMMONIA" in the last 168 hours. Coagulation Profile: Recent Labs  Lab 10/18/22 2051  INR 1.4*    Cardiac Enzymes: No results for input(s): "CKTOTAL", "CKMB", "CKMBINDEX", "TROPONINI" in the last 168 hours. BNP (last 3 results) No results for input(s): "PROBNP" in the last 8760 hours. HbA1C: No results for input(s): "HGBA1C" in the last 72 hours. CBG: No results for input(s): "GLUCAP" in the last 168 hours. Lipid Profile: No results for input(s): "CHOL", "HDL", "LDLCALC", "TRIG", "CHOLHDL", "LDLDIRECT" in the last 72 hours. Thyroid Function Tests: Recent Labs    10/18/22 1624  TSH 1.443    Anemia Panel: No results for input(s): "VITAMINB12", "FOLATE", "FERRITIN", "TIBC", "IRON", "RETICCTPCT" in the last 72 hours. Most Recent Urinalysis On File:     Component Value Date/Time   COLORURINE YELLOW (A)  10/18/2022 1605   APPEARANCEUR CLEAR (A) 10/18/2022 1605   LABSPEC 1.019 10/18/2022 1605   PHURINE 5.0 10/18/2022 1605   GLUCOSEU NEGATIVE 10/18/2022 1605   HGBUR NEGATIVE 10/18/2022 1605   BILIRUBINUR NEGATIVE 10/18/2022 1605   KETONESUR NEGATIVE 10/18/2022 1605   PROTEINUR NEGATIVE 10/18/2022 1605   NITRITE NEGATIVE 10/18/2022 1605   LEUKOCYTESUR NEGATIVE 10/18/2022 1605   Sepsis Labs: @LABRCNTIP (procalcitonin:4,lacticidven:4) Microbiology: No results found for this or any previous visit (from the past 240 hour(s)).    Radiology Studies last 3 days: ECHOCARDIOGRAM COMPLETE  Result Date: 10/19/2022    ECHOCARDIOGRAM REPORT  Patient Name:   Thomas [name] Date of Exam: 10/18/2022 Medical Rec #:  [id number]     Height:       69.5 in Accession #:    [id number]    Weight:       172.0 lb Date of Birth:  07-Mar-1946     BSA:          1.947 m Patient Age:    61 years      BP:           178/72 mmHg Patient Gender: M             HR:           49 bpm. Exam Location:  [facility] Procedure: 2D Echo, Cardiac Doppler and Color Doppler Indications:     R55 Syncope  History:         Patient has prior history of Echocardiogram examinations, most                  recent 11/04/2015. Arrythmias:Paroxysmal Atrial Fibrillation;                  Risk Factors:Hypertension and Dyslipidemia. Palpitations.  Sonographer:     Daphine Deutscher RDCS Referring Phys:  [id number] CHRISTOPHER END Diagnosing Phys: Yvonne Kendall MD IMPRESSIONS  1. Left ventricular ejection fraction, by estimation, is 55 to 60%. The left ventricle has normal function. The left ventricle demonstrates regional wall motion abnormalities (see scoring diagram/findings for description). Left ventricular diastolic parameters are indeterminate. There is mild hypokinesis of the left ventricular, basal inferior segment.  2. Pulmonary artery pressure is at least mildly elevated (RVSP 30-35 mmHg plus central venous/right atrial pressure). Right ventricular systolic  function is normal. The right ventricular size is normal.  3. Left atrial size was moderately dilated.  4. The mitral valve is normal in structure. Mild to moderate mitral valve regurgitation.  5. Tricuspid valve regurgitation is moderate.  6. The aortic valve is tricuspid. There is mild thickening of the aortic valve. Aortic valve regurgitation is mild. Aortic valve sclerosis is present, with no evidence of aortic valve stenosis. FINDINGS  Left Ventricle: Left ventricular ejection fraction, by estimation, is 55 to 60%. The left ventricle has normal function. The left ventricle demonstrates regional wall motion abnormalities. Mild hypokinesis of the left ventricular, basal inferior segment. The left ventricular internal cavity size was normal in size. There is borderline left ventricular hypertrophy. Left ventricular diastolic parameters are indeterminate. Right Ventricle: Pulmonary artery pressure is at least mildly elevated (RVSP 30-35 mmHg plus central venous/right atrial pressure). The right ventricular size is normal. No increase in right ventricular wall thickness. Right ventricular systolic function  is normal. Left Atrium: Left atrial size was moderately dilated. Right Atrium: Right atrial size was normal in size. Pericardium: There is no evidence of pericardial effusion. Mitral Valve: The mitral valve is normal in structure. Mild mitral annular calcification. Mild to moderate mitral valve regurgitation. Tricuspid Valve: The tricuspid valve is normal in structure. Tricuspid valve regurgitation is moderate. Aortic Valve: The aortic valve is tricuspid. There is mild thickening of the aortic valve. Aortic valve regurgitation is mild. Aortic valve sclerosis is present, with no evidence of aortic valve stenosis. Pulmonic Valve: The pulmonic valve was not well visualized. Pulmonic valve regurgitation is trivial. No evidence of pulmonic stenosis. Aorta: The aortic root is normal in size and structure. Pulmonary  Artery: The pulmonary artery is not well seen. Venous: The inferior vena cava was not well visualized. IAS/Shunts:  The interatrial septum was not well visualized.  LEFT VENTRICLE PLAX 2D LVIDd:         5.10 cm      Diastology LVIDs:         3.10 cm      LV e' medial:    6.40 cm/s LV PW:         1.00 cm      LV E/e' medial:  11.7 LV IVS:        1.10 cm      LV e' lateral:   7.53 cm/s LVOT diam:     2.20 cm      LV E/e' lateral: 9.9 LV SV:         74 LV SV Index:   38 LVOT Area:     3.80 cm  LV Volumes (MOD) LV vol d, MOD A2C: 120.4 ml LV vol d, MOD A4C: 84.4 ml LV vol s, MOD A2C: 39.1 ml LV vol s, MOD A4C: 36.1 ml LV SV MOD A2C:     81.3 ml LV SV MOD A4C:     84.4 ml LV SV MOD BP:      63.1 ml RIGHT VENTRICLE RV Basal diam:  4.10 cm RV S prime:     12.70 cm/s TAPSE (M-mode): 3.2 cm LEFT ATRIUM             Index        RIGHT ATRIUM           Index LA diam:        5.00 cm 2.57 cm/m   RA Area:     13.90 cm LA Vol (A2C):   72.5 ml 37.23 ml/m  RA Volume:   29.60 ml  15.20 ml/m LA Vol (A4C):   87.8 ml 45.09 ml/m LA Biplane Vol: 79.1 ml 40.62 ml/m  AORTIC VALVE LVOT Vmax:   68.30 cm/s LVOT Vmean:  50.050 cm/s LVOT VTI:    0.196 m  AORTA Ao Root diam: 3.70 cm MITRAL VALVE               TRICUSPID VALVE MV Area (PHT): 2.36 cm    TR Peak grad:   32.7 mmHg MV Decel Time: 322 msec    TR Vmax:        286.00 cm/s MV E velocity: 74.90 cm/s MV A velocity: 79.70 cm/s  SHUNTS MV E/A ratio:  0.94        Systemic VTI:  0.20 m                            Systemic Diam: 2.20 cm Yvonne Kendall MD Electronically signed by Yvonne Kendall MD Signature Date/Time: 10/19/2022/7:20:36 AM    Final    DG Chest Port 1 View  Result Date: 10/18/2022 CLINICAL DATA:  Passing out on and off for the past month or so. Bradycardia EXAM: PORTABLE CHEST 1 VIEW COMPARISON:  None FINDINGS: Borderline cardiomegaly. Aortic atherosclerotic calcification. Defibrillator pads overlie the left chest. Left basilar atelectasis/scarring. Otherwise no focal  consolidation, pleural effusion, or pneumothorax. No displaced rib fractures. IMPRESSION: No active disease. Electronically Signed   By: Minerva Fester M.D.   On: 10/18/2022 19:58             LOS: 2 days      Darlin Drop, DO [facility] 10/20/2022, 4:48 PM    Dictation software may have been used to generate the above note. Typos may  occur and escape review in typed/dictated notes. Please contact Thomas Sheppard Co[facility] directly for clarity if needed.  Staff may message me via secure chat in [place]  but this may not receive an immediate response,  please page me for urgent matters!  If 7PM-7AM, please contact night coverage [email or web address]

## 2022-10-20 NOTE — Progress Notes (Signed)
Mobility Specialist - Progress Note  10/20/22 1025  Mobility  Activity Dangled on edge of bed;Stood at bedside  Level of Assistance Standby assist, set-up cues, supervision of patient - no hands on  Assistive Device None  Range of Motion/Exercises Active  Activity Response Tolerated well  $Mobility charge 1 Mobility  Mobility Specialist Start Time (ACUTE ONLY) 1019  Mobility Specialist Stop Time (ACUTE ONLY) 1024  Mobility Specialist Time Calculation (min) (ACUTE ONLY) 5 min   MS responding to bed alarm. Pt left seated EOB with alarm set and need within reach.   Zetta Bills Mobility Specialist 10/20/22 10:26 AM
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
## 2022-10-20 NOTE — Plan of Care (Signed)

## 2022-10-20 NOTE — Progress Notes (Signed)
Patient arrived to floor hemodynamically stable and asymptomatic. Received sign out from sending cardiology team. Plan PPM tomorrow for bradycardia. HR presently 50s-70s, NSR with occasional PACs/PVCs but also intermittent high grade AVB with HR ~40s on telemetry. He has no symptoms. Blood pressure remains elevated, 180/68. Home atenolol (and PRN diltiazem) are on hold for bradycardia. Has irbesartan ordered for AM. Has AM BMET ordered. Per d/w Dr. Anne Fu, would not be overly aggressive with BP control but OK for hydralazine 5mg  IV PRN SBP >160, will enter order as q6hr PRN in Epic. Nurse also inquired about threshold for notification of HR since patient is likely expected to have intermittent bradycardia until PPM. Per d/w Dr. Anne Fu, OK to lower notification threshold for HR <40bpm. The staff on 3e will continue to reassess and monitor him closely. Patient is aware to notify for any new symptoms.

## 2022-10-20 NOTE — Discharge Summary (Signed)
Discharge Summary  Thomas [name] JYN:[id number] DOB: [birth year]  PCP: Smitty Cords, DO  Admit date: 10/18/2022 Discharge date: 10/20/2022  Time spent: 35 minutes   Recommendations for Outpatient Follow-up:  Transferred to [facility] for possible pacemaker placement.  Discharge Diagnoses:  Active Hospital Problems   Diagnosis Date Noted   Second degree heart block 10/18/2022   Bradycardia 10/19/2022   Syncope and collapse 10/19/2022   Troponin I above reference range 10/18/2022   Atrial fibrillation, chronic (HCC) 10/18/2022   Heart block 10/18/2022    Resolved Hospital Problems  No resolved problems to display.    Discharge Condition: Stable   Vitals:   10/20/22 1526 10/20/22 1549  BP: (!) 162/65 (!) 162/65  Pulse: (!) 35   Resp: 20   Temp: 98.5 F (36.9 C) 98.5 F (36.9 C)  SpO2: 97% 97%    History of present illness:   Thomas [name] is a 77 y.o. male with medical history significant of paroxysmal atrial fibrillation.  Patient is on chronic atenolol therapy.  Patient was in his usual state of health till about a month ago when 2 episodes LOC (last about 5 days prior to ED arrival) without any symptoms, and w/ prompt return to neurologic baseline. Notes HR 20s on occasion w/ self-monitoring. Sent to ED.  06/18: to ED. Cardiology consult, stopping beta blocker  06/19: remains bradycardic, additional 24 to 36-hour washout before making any decision on pacemaker. No concerns on Echo.    Consultants:  Cardiology    Procedures: none    Subjective / Brief ROS:  Reports no concern except for constipation, no bowel movement in days.  Plan to transfer to [facility] on 10/20/22 for electrophysiology evaluation and possible pacemaker placement.     Family Communication: None at bedside.       Hospital Course:  Principal Problem:   Second degree heart block Active Problems:   Troponin I above reference range   Atrial fibrillation,  chronic (HCC)   Heart block   Bradycardia   Syncope and collapse  Second degree heart block AV block w/ syncope Intermittent bradycardia, 2:1 AV block  New onset atrial fibrillation w/ slow ventricular response and PVC (see EKG 10/19/22) Thyroid cascade within normal limits.  Troponins are flat.   Telemetry Atropine if symptomatic bradycardia  atenolol will be held, diltiazem will be held.  additional 24 to 36-hour washout before making any decision on pacemaker  Echo no concerns If not improving will need EP to see tomorrow per Dr End Lyme serology with reflex.    Troponin I above reference range likely supply-demand mismatch due to the bradycardia itself and not suggestive of acute coronary syndrome at this time.  Echo - Normal ejection fraction on echo, no plans at this time for ischemic workup    Atrial fibrillation, chronic, paroxysmal (HCC) apixaban will be held given possibility of patient requiring invasive procedures.   Rate controlled medications are being held as above.   Heparin per pharmacy    HLD Statin       DVT prophylaxis: heparin  Pertinent IV fluids/nutrition: no continuous IV fluids  Central lines / invasive devices: none    Code Status: FULL CODE ACP documentation reviewed: 10/19/22 none on file   Current Admission Status: inpatient  TOC needs / Dispo plan: anticiapte d/c home, no TOC needs at this time  Barriers to discharge / significant pending items: beta blocker washout as above, may need pacemaker depending on HR, anticipate  will be here another 2-4 days      Discharge Exam: BP (!) 162/65   Pulse (!) 35   Temp 98.5 F (36.9 C) (Oral)   Resp 20   Ht 5' 9.5" (1.765 m)   Wt 78.2 kg   SpO2 97%   BMI 25.09 kg/m  General: 77 y.o. year-old male well developed well nourished in no acute distress.  Alert and oriented x3. Cardiovascular: Regular rate and rhythm with no rubs or gallops.  No thyromegaly or JVD noted.   Respiratory: Clear to  auscultation with no wheezes or rales. Good inspiratory effort. Abdomen: Soft nontender nondistended with normal bowel sounds x4 quadrants. Musculoskeletal: No lower extremity edema. 2/4 pulses in all 4 extremities. Skin: No ulcerative lesions noted or rashes, Psychiatry: Mood is appropriate for condition and setting  Discharge Instructions You were cared for by a hospitalist during your hospital stay. If you have any questions about your discharge medications or the care you received while you were in the hospital after you are discharged, you can call the unit and asked to speak with the hospitalist on call if the hospitalist that took care of you is not available. Once you are discharged, your primary care physician will handle any further medical issues. Please note that NO REFILLS for any discharge medications will be authorized once you are discharged, as it is imperative that you return to your primary care physician (or establish a relationship with a primary care physician if you do not have one) for your aftercare needs so that they can reassess your need for medications and monitor your lab values.   Allergies as of 10/20/2022       Reactions   Penicillins Itching, Swelling   Flexeril [cyclobenzaprine] Itching, Swelling   Proscar [finasteride] Other (See Comments)   dizziness   Diazepam    Metronidazole    Other reaction(s): Abdominal bloating, Stomach cramps, Abdominal bloating, Stomach cramps        Medication List     ASK your doctor about these medications    alendronate 70 MG tablet Commonly known as: FOSAMAX TAKE ONE TABLET BY MOUTH ONCE WEEKLY TAKE FIRST THING IN MORNING WITH 8OZ WATER 30 MINUTES BEFORE FOOD OR DRINK.REMAIN UPRIGHT FOR 30 MINUTES.   apixaban 5 MG Tabs tablet Commonly known as: Eliquis Take 1 tablet (5 mg total) by mouth 2 (two) times daily.   atenolol 25 MG tablet Commonly known as: TENORMIN Take 1 tablet (25 mg total) by mouth daily.    calcium carbonate 1500 (600 Ca) MG Tabs tablet Commonly known as: OSCAL Take 1 tablet by mouth daily.   Cholecalciferol 25 MCG (1000 UT) tablet Take 1 tablet by mouth daily.   diltiazem 30 MG tablet Commonly known as: Cardizem Take 1 tablet (30 mg total) by mouth every 8 (eight) hours as needed (for heart rate greater than 100 beats per minute).   fluticasone 50 MCG/ACT nasal spray Commonly known as: FLONASE Place 2 sprays into both nostrils daily.   ketotifen 0.025 % ophthalmic solution Commonly known as: ZADITOR Place 1 drop into both eyes 2 (two) times daily.   loratadine 10 MG tablet Commonly known as: CLARITIN Take 10 mg by mouth daily.   multivitamin capsule Take 1 capsule by mouth daily.   RA FLAX SEED OIL 1000 PO Take 1 tablet by mouth daily.   rosuvastatin 5 MG tablet Commonly known as: Crestor Take 1 tablet (5 mg total) by mouth every other day. At bedtime.  Saw Palmetto 450 MG Caps Take 3 capsules by mouth daily.   Shingrix injection Generic drug: Zoster Vaccine Adjuvanted Inject 0.5 mL into muscle for shingles vaccine. Repeat dose in 2-6 months.   traMADol 50 MG tablet Commonly known as: ULTRAM Take 1 tablet (50 mg total) by mouth 2 (two) times daily as needed for moderate pain.   triamcinolone cream 0.1 % Commonly known as: KENALOG APPLY CREAM EXTERNALLY TO AFFECTED AREA TWICE DAILY       Allergies  Allergen Reactions   Penicillins Itching and Swelling   Flexeril [Cyclobenzaprine] Itching and Swelling   Proscar [Finasteride] Other (See Comments)    dizziness   Diazepam    Metronidazole     Other reaction(s): Abdominal bloating, Stomach cramps, Abdominal bloating, Stomach cramps      The results of significant diagnostics from this hospitalization (including imaging, microbiology, ancillary and laboratory) are listed below for reference.    Significant Diagnostic Studies: ECHOCARDIOGRAM COMPLETE  Result Date: 10/19/2022     ECHOCARDIOGRAM REPORT   Patient Name:   ABDUALLAH DOOLITTLE Quigg Date of Exam: 10/18/2022 Medical Rec #:  [id number]     Height:       69.5 in Accession #:    [id number]    Weight:       172.0 lb Date of Birth:  [birth year]     BSA:          1.947 m Patient Age:    50 years      BP:           178/72 mmHg Patient Gender: M             HR:           49 bpm. Exam Location:  [facility] Procedure: 2D Echo, Cardiac Doppler and Color Doppler Indications:     R55 Syncope  History:         Patient has prior history of Echocardiogram examinations, most                  recent 11/04/2015. Arrythmias:Paroxysmal Atrial Fibrillation;                  Risk Factors:Hypertension and Dyslipidemia. Palpitations.  Sonographer:     Daphine Deutscher RDCS Referring Phys:  [id number] CHRISTOPHER END Diagnosing Phys: Yvonne Kendall MD IMPRESSIONS  1. Left ventricular ejection fraction, by estimation, is 55 to 60%. The left ventricle has normal function. The left ventricle demonstrates regional wall motion abnormalities (see scoring diagram/findings for description). Left ventricular diastolic parameters are indeterminate. There is mild hypokinesis of the left ventricular, basal inferior segment.  2. Pulmonary artery pressure is at least mildly elevated (RVSP 30-35 mmHg plus central venous/right atrial pressure). Right ventricular systolic function is normal. The right ventricular size is normal.  3. Left atrial size was moderately dilated.  4. The mitral valve is normal in structure. Mild to moderate mitral valve regurgitation.  5. Tricuspid valve regurgitation is moderate.  6. The aortic valve is tricuspid. There is mild thickening of the aortic valve. Aortic valve regurgitation is mild. Aortic valve sclerosis is present, with no evidence of aortic valve stenosis. FINDINGS  Left Ventricle: Left ventricular ejection fraction, by estimation, is 55 to 60%. The left ventricle has normal function. The left ventricle demonstrates regional wall motion abnormalities.  Mild hypokinesis of the left ventricular, basal inferior segment. The left ventricular internal cavity size was normal in size. There is borderline left ventricular hypertrophy. Left ventricular diastolic parameters are indeterminate.  Right Ventricle: Pulmonary artery pressure is at least mildly elevated (RVSP 30-35 mmHg plus central venous/right atrial pressure). The right ventricular size is normal. No increase in right ventricular wall thickness. Right ventricular systolic function  is normal. Left Atrium: Left atrial size was moderately dilated. Right Atrium: Right atrial size was normal in size. Pericardium: There is no evidence of pericardial effusion. Mitral Valve: The mitral valve is normal in structure. Mild mitral annular calcification. Mild to moderate mitral valve regurgitation. Tricuspid Valve: The tricuspid valve is normal in structure. Tricuspid valve regurgitation is moderate. Aortic Valve: The aortic valve is tricuspid. There is mild thickening of the aortic valve. Aortic valve regurgitation is mild. Aortic valve sclerosis is present, with no evidence of aortic valve stenosis. Pulmonic Valve: The pulmonic valve was not well visualized. Pulmonic valve regurgitation is trivial. No evidence of pulmonic stenosis. Aorta: The aortic root is normal in size and structure. Pulmonary Artery: The pulmonary artery is not well seen. Venous: The inferior vena cava was not well visualized. IAS/Shunts: The interatrial septum was not well visualized.  LEFT VENTRICLE PLAX 2D LVIDd:         5.10 cm      Diastology LVIDs:         3.10 cm      LV e' medial:    6.40 cm/s LV PW:         1.00 cm      LV E/e' medial:  11.7 LV IVS:        1.10 cm      LV e' lateral:   7.53 cm/s LVOT diam:     2.20 cm      LV E/e' lateral: 9.9 LV SV:         74 LV SV Index:   38 LVOT Area:     3.80 cm  LV Volumes (MOD) LV vol d, MOD A2C: 120.4 ml LV vol d, MOD A4C: 84.4 ml LV vol s, MOD A2C: 39.1 ml LV vol s, MOD A4C: 36.1 ml LV SV MOD A2C:      81.3 ml LV SV MOD A4C:     84.4 ml LV SV MOD BP:      63.1 ml RIGHT VENTRICLE RV Basal diam:  4.10 cm RV S prime:     12.70 cm/s TAPSE (M-mode): 3.2 cm LEFT ATRIUM             Index        RIGHT ATRIUM           Index LA diam:        5.00 cm 2.57 cm/m   RA Area:     13.90 cm LA Vol (A2C):   72.5 ml 37.23 ml/m  RA Volume:   29.60 ml  15.20 ml/m LA Vol (A4C):   87.8 ml 45.09 ml/m LA Biplane Vol: 79.1 ml 40.62 ml/m  AORTIC VALVE LVOT Vmax:   68.30 cm/s LVOT Vmean:  50.050 cm/s LVOT VTI:    0.196 m  AORTA Ao Root diam: 3.70 cm MITRAL VALVE               TRICUSPID VALVE MV Area (PHT): 2.36 cm    TR Peak grad:   32.7 mmHg MV Decel Time: 322 msec    TR Vmax:        286.00 cm/s MV E velocity: 74.90 cm/s MV A velocity: 79.70 cm/s  SHUNTS MV E/A ratio:  0.94        Systemic VTI:  0.20 m                            Systemic Diam: 2.20 cm Yvonne Kendall MD Electronically signed by Yvonne Kendall MD Signature Date/Time: 10/19/2022/7:20:36 AM    Final    DG Chest Port 1 View  Result Date: 10/18/2022 CLINICAL DATA:  Passing out on and off for the past month or so. Bradycardia EXAM: PORTABLE CHEST 1 VIEW COMPARISON:  None FINDINGS: Borderline cardiomegaly. Aortic atherosclerotic calcification. Defibrillator pads overlie the left chest. Left basilar atelectasis/scarring. Otherwise no focal consolidation, pleural effusion, or pneumothorax. No displaced rib fractures. IMPRESSION: No active disease. Electronically Signed   By: Minerva Fester M.D.   On: 10/18/2022 19:58    Microbiology: No results found for this or any previous visit (from the past 240 hour(s)).   Labs: Basic Metabolic Panel: Recent Labs  Lab 10/18/22 1624 10/19/22 0607  NA 133* 134*  K 4.3 4.1  CL 102 102  CO2 22 23  GLUCOSE 97 97  BUN 24* 18  CREATININE 1.09 0.86  CALCIUM 8.7* 8.3*  MG 2.3  --    Liver Function Tests: Recent Labs  Lab 10/18/22 1624  AST 19  ALT 15  ALKPHOS 43  BILITOT 1.0  PROT 7.6  ALBUMIN 3.2*   No  results for input(s): "LIPASE", "AMYLASE" in the last 168 hours. No results for input(s): "AMMONIA" in the last 168 hours. CBC: Recent Labs  Lab 10/18/22 1624 10/19/22 0607 10/20/22 0351  WBC 6.2 4.5 6.1  HGB 13.4 12.2* 12.2*  HCT 39.3 35.4* 35.5*  MCV 95.9 95.2 95.4  PLT 205 182 195   Cardiac Enzymes: No results for input(s): "CKTOTAL", "CKMB", "CKMBINDEX", "TROPONINI" in the last 168 hours. BNP: BNP (last 3 results) No results for input(s): "BNP" in the last 8760 hours.  ProBNP (last 3 results) No results for input(s): "PROBNP" in the last 8760 hours.  CBG: No results for input(s): "GLUCAP" in the last 168 hours.     Signed:  Darlin Drop, MD [facility] 10/20/2022, 6:20 PM
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
## 2022-10-21 ENCOUNTER — Ambulatory Visit (HOSPITAL_COMMUNITY): Admission: RE | Admit: 2022-10-21 | Payer: Medicare HMO | Source: Home / Self Care | Admitting: Internal Medicine

## 2022-10-21 ENCOUNTER — Inpatient Hospital Stay (HOSPITAL_COMMUNITY): Payer: Medicare HMO

## 2022-10-21 ENCOUNTER — Encounter (HOSPITAL_COMMUNITY)
Disposition: A | Discharge status: Home / Self Care | Payer: Self-pay | Source: Other Acute Inpatient Hospital | Attending: Cardiology

## 2022-10-21 DIAGNOSIS — Z833 Family history of diabetes mellitus: Secondary | ICD-10-CM | POA: Diagnosis not present

## 2022-10-21 DIAGNOSIS — E785 Hyperlipidemia, unspecified: Secondary | ICD-10-CM | POA: Diagnosis not present

## 2022-10-21 DIAGNOSIS — Z7983 Long term (current) use of bisphosphonates: Secondary | ICD-10-CM | POA: Diagnosis not present

## 2022-10-21 DIAGNOSIS — Z79899 Other long term (current) drug therapy: Secondary | ICD-10-CM | POA: Diagnosis not present

## 2022-10-21 DIAGNOSIS — Z823 Family history of stroke: Secondary | ICD-10-CM | POA: Diagnosis not present

## 2022-10-21 DIAGNOSIS — R55 Syncope and collapse: Secondary | ICD-10-CM | POA: Diagnosis not present

## 2022-10-21 DIAGNOSIS — Z83438 Family history of other disorder of lipoprotein metabolism and other lipidemia: Secondary | ICD-10-CM | POA: Diagnosis not present

## 2022-10-21 DIAGNOSIS — I441 Atrioventricular block, second degree: Secondary | ICD-10-CM | POA: Diagnosis not present

## 2022-10-21 DIAGNOSIS — N401 Enlarged prostate with lower urinary tract symptoms: Secondary | ICD-10-CM | POA: Diagnosis not present

## 2022-10-21 DIAGNOSIS — Z8249 Family history of ischemic heart disease and other diseases of the circulatory system: Secondary | ICD-10-CM | POA: Diagnosis not present

## 2022-10-21 DIAGNOSIS — Z7901 Long term (current) use of anticoagulants: Secondary | ICD-10-CM | POA: Diagnosis not present

## 2022-10-21 DIAGNOSIS — N400 Enlarged prostate without lower urinary tract symptoms: Secondary | ICD-10-CM | POA: Diagnosis not present

## 2022-10-21 DIAGNOSIS — I480 Paroxysmal atrial fibrillation: Secondary | ICD-10-CM | POA: Diagnosis not present

## 2022-10-21 DIAGNOSIS — Z880 Allergy status to penicillin: Secondary | ICD-10-CM | POA: Diagnosis not present

## 2022-10-21 DIAGNOSIS — Z95810 Presence of automatic (implantable) cardiac defibrillator: Secondary | ICD-10-CM | POA: Diagnosis not present

## 2022-10-21 DIAGNOSIS — Z888 Allergy status to other drugs, medicaments and biological substances status: Secondary | ICD-10-CM | POA: Diagnosis not present

## 2022-10-21 DIAGNOSIS — N138 Other obstructive and reflux uropathy: Secondary | ICD-10-CM | POA: Diagnosis not present

## 2022-10-21 DIAGNOSIS — I10 Essential (primary) hypertension: Secondary | ICD-10-CM | POA: Diagnosis not present

## 2022-10-21 DIAGNOSIS — Z87891 Personal history of nicotine dependence: Secondary | ICD-10-CM | POA: Diagnosis not present

## 2022-10-21 DIAGNOSIS — R0609 Other forms of dyspnea: Secondary | ICD-10-CM | POA: Diagnosis not present

## 2022-10-21 HISTORY — PX: PACEMAKER IMPLANT: EP1218

## 2022-10-21 LAB — GLUCOSE, CAPILLARY
Glucose-Capillary: 96 mg/dL (ref 70–99)
Glucose-Capillary: 98 mg/dL (ref 70–99)

## 2022-10-21 LAB — CBC
HCT: 37.7 % — ABNORMAL LOW (ref 39.0–52.0)
Hemoglobin: 12.8 g/dL — ABNORMAL LOW (ref 13.0–17.0)
MCH: 32.7 pg (ref 26.0–34.0)
MCHC: 34 g/dL (ref 30.0–36.0)
MCV: 96.2 fL (ref 80.0–100.0)
Platelets: 196 10*3/uL (ref 150–400)
RBC: 3.92 MIL/uL — ABNORMAL LOW (ref 4.22–5.81)
RDW: 13.6 % (ref 11.5–15.5)
WBC: 5.8 10*3/uL (ref 4.0–10.5)
nRBC: 0 % (ref 0.0–0.2)

## 2022-10-21 LAB — BASIC METABOLIC PANEL
Anion gap: 11 (ref 5–15)
BUN: 19 mg/dL (ref 8–23)
CO2: 24 mmol/L (ref 22–32)
Calcium: 8.9 mg/dL (ref 8.9–10.3)
Chloride: 99 mmol/L (ref 98–111)
Creatinine, Ser: 1.16 mg/dL (ref 0.61–1.24)
GFR, Estimated: >60 mL/min (ref >60)
Glucose, Bld: 104 mg/dL — ABNORMAL HIGH (ref 70–99)
Potassium: 4.7 mmol/L (ref 3.5–5.1)
Sodium: 134 mmol/L — ABNORMAL LOW (ref 135–145)

## 2022-10-21 LAB — PROTIME-INR
INR: 1.2 (ref 0.8–1.2)
Prothrombin Time: 15.2 seconds (ref 11.4–15.2)

## 2022-10-21 SURGERY — PACEMAKER IMPLANT

## 2022-10-21 MED ORDER — CHLORHEXIDINE GLUCONATE 4 % EX SOLN
60.0000 mL | Freq: Once | CUTANEOUS | Status: AC
  Administered 2022-10-21: 4 via TOPICAL
  Filled 2022-10-21: qty 15

## 2022-10-21 MED ORDER — MIDAZOLAM HCL 5 MG/5ML IJ SOLN
INTRAMUSCULAR | Status: AC
  Filled 2022-10-21: qty 5

## 2022-10-21 MED ORDER — FENTANYL CITRATE (PF) 100 MCG/2ML IJ SOLN
INTRAMUSCULAR | Status: AC
  Filled 2022-10-21: qty 2

## 2022-10-21 MED ORDER — LIDOCAINE HCL 1 % IJ SOLN
INTRAMUSCULAR | Status: AC
  Filled 2022-10-21: qty 20

## 2022-10-21 MED ORDER — CEFAZOLIN SODIUM-DEXTROSE 1-4 GM/50ML-% IV SOLN
1.0000 g | Freq: Four times a day (QID) | INTRAVENOUS | Status: DC
  Administered 2022-10-21: 1 g via INTRAVENOUS
  Filled 2022-10-21 (×3): qty 50

## 2022-10-21 MED ORDER — ONDANSETRON HCL 4 MG/2ML IJ SOLN: 4.0000 mg | Freq: Four times a day (QID) | INTRAMUSCULAR | Status: DC | PRN

## 2022-10-21 MED ORDER — VANCOMYCIN HCL 1000 MG IV SOLR
INTRAVENOUS | Status: AC | PRN
  Administered 2022-10-21: 1000 mg via INTRAVENOUS

## 2022-10-21 MED ORDER — ACETAMINOPHEN 325 MG PO TABS
325.0000 mg | ORAL_TABLET | ORAL | Status: DC | PRN
  Administered 2022-10-21: 650 mg via ORAL
  Filled 2022-10-21: qty 2

## 2022-10-21 MED ORDER — SODIUM CHLORIDE 0.9 % IV SOLN
INTRAVENOUS | Status: AC
  Filled 2022-10-21: qty 2

## 2022-10-21 MED ORDER — APIXABAN 5 MG PO TABS: 5.0000 mg | ORAL_TABLET | Freq: Two times a day (BID) | ORAL | 3 refills | Status: AC

## 2022-10-21 MED ORDER — ACETAMINOPHEN 325 MG PO TABS: 325.0000 mg | ORAL_TABLET | ORAL | Status: DC | PRN

## 2022-10-21 MED ORDER — MIDAZOLAM HCL 5 MG/5ML IJ SOLN
INTRAMUSCULAR | Status: DC | PRN
  Administered 2022-10-21 (×2): 1 mg via INTRAVENOUS

## 2022-10-21 MED ORDER — LIDOCAINE HCL (PF) 1 % IJ SOLN
INTRAMUSCULAR | Status: AC
  Filled 2022-10-21: qty 30

## 2022-10-21 MED ORDER — LIDOCAINE HCL (PF) 1 % IJ SOLN
INTRAMUSCULAR | Status: DC | PRN
  Administered 2022-10-21: 50 mL

## 2022-10-21 MED ORDER — CHLORHEXIDINE GLUCONATE 4 % EX SOLN
60.0000 mL | Freq: Once | CUTANEOUS | Status: AC
  Administered 2022-10-21: 4 via TOPICAL

## 2022-10-21 MED ORDER — VANCOMYCIN HCL IN DEXTROSE 1-5 GM/200ML-% IV SOLN
INTRAVENOUS | Status: AC
  Filled 2022-10-21: qty 200

## 2022-10-21 MED ORDER — FENTANYL CITRATE (PF) 100 MCG/2ML IJ SOLN
INTRAMUSCULAR | Status: DC | PRN
  Administered 2022-10-21 (×2): 12.5 ug via INTRAVENOUS

## 2022-10-21 MED ORDER — LIDOCAINE HCL (PF) 1 % IJ SOLN
INTRAMUSCULAR | Status: AC
  Filled 2022-10-21: qty 60

## 2022-10-21 MED ORDER — HEPARIN (PORCINE) IN NACL 1000-0.9 UT/500ML-% IV SOLN
INTRAVENOUS | Status: DC | PRN
  Administered 2022-10-21: 500 mL

## 2022-10-21 SURGICAL SUPPLY — 11 items
CABLE SURGICAL S-101-97-12 (CABLE) ×1 IMPLANT
CATH RIGHTSITE C315HIS02 (CATHETERS) IMPLANT
LEAD SELECT SECURE 3830 383069 (Lead) IMPLANT
LEAD ULTIPACE 58 LPA1231/58 (Lead) IMPLANT
PACEMAKER ASSURITY DR-RF (Pacemaker) IMPLANT
PAD DEFIB RADIO PHYSIO CONN (PAD) ×1 IMPLANT
SELECT SECURE 3830 383069 (Lead) ×1 IMPLANT
SHEATH 7FR PRELUDE SNAP 13 (SHEATH) IMPLANT
SLITTER 6232ADJ (MISCELLANEOUS) IMPLANT
TRAY PACEMAKER INSERTION (PACKS) ×1 IMPLANT
WIRE HI TORQ VERSACORE-J 145CM (WIRE) IMPLANT

## 2022-10-21 NOTE — Discharge Summary (Signed)
ELECTROPHYSIOLOGY PROCEDURE DISCHARGE SUMMARY    Patient ID: Thomas [name],  MRN: [id number], DOB/AGE: [birth year] 79 y.o.  Admit date: 10/20/2022 Discharge date: 10/21/2022  Primary Care Physician: Smitty Cords, DO  Primary Cardiologist: Julien Nordmann, MD  Electrophysiologist: Dr. 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  Implantation of a Abbott Dual Chamber PPM on 10/21/2022 by Dr. Graciela Husbands. The patient received a Abbott Assurity [id number] with a Abbott Ultipace 1231-58 right atrial lead and a Medtronic SelectSure 3830 right ventricular lead.  There were no immediate post procedure complications.   2.  CXR on 10/21/2022 demonstrated no pneumothorax status post device implantation.    Brief HPI: Thomas [name] is a 77 y.o. male was admitted to [facility] with recurrent syncope and found to have 2:1 AVB. Electrophysiology team asked to see for consideration of PPM implantation.  Past medical history includes above.  The patient has had AV block without reversible causes identified.  Risks, benefits, and alternatives to PPM implantation were reviewed with the patient who wished to proceed.   Hospital Course:  The patient was admitted and underwent implantation of a Abbott dual chamber PPM with details as outlined above.  He was monitored on telemetry which demonstrated appropriate pacing.  Left chest was without hematoma or ecchymosis.  The device was interrogated and found to be functioning normally.  CXR was obtained and demonstrated no  pneumothorax status post device implantation.  Wound care, arm mobility, and restrictions were reviewed with the patient.  The patient was examined and considered stable for discharge to home.    Anticoagulation resumption This patient should resume their Eliquis on Wednesday, June 26/2024    Physical Exam: Vitals:   10/21/22 0945 10/21/22 0950 10/21/22 1016 10/21/22 1618  BP: (!) 145/78  (!) 152/68 (!) 168/74  Pulse: (!) 0  60 65  Resp:   18 18  Temp:   97.6 F (36.4 C) 97.6 F (36.4 C)  TempSrc:   Oral Oral  SpO2: 99% 96% 95% 95%  Weight:      Height:        Discharge Medications:  Allergies as of 10/21/2022       Reactions   Penicillins Itching, Swelling   Flexeril [cyclobenzaprine] Itching, Swelling   Proscar [finasteride] Other (See Comments)   dizziness   Diazepam    Metronidazole    Other reaction(s): Abdominal bloating, Stomach cramps, Abdominal bloating, Stomach cramps        Medication List     STOP taking these medications    traMADol 50 MG tablet Commonly known as: ULTRAM       TAKE these medications    acetaminophen 325 MG tablet Commonly known as: TYLENOL Take 1-2 tablets (325-650 mg total) by mouth every 4 (four) hours as needed for mild pain.   apixaban 5 MG Tabs tablet Commonly known as: Eliquis Take 1 tablet (5 mg total) by mouth 2 (two) times daily. Start taking on: October 26, 2022 What changed: These instructions start on October 26, 2022. If you are unsure what to  do until then, ask your doctor or other care provider.   atenolol 25 MG tablet Commonly known as: TENORMIN Take 1 tablet (25 mg total) by mouth daily.   diltiazem 30 MG tablet Commonly known as: Cardizem Take 1 tablet (30 mg total) by mouth every 8 (eight) hours as needed (for heart rate greater than 100 beats per minute).   rosuvastatin 5 MG tablet Commonly known as: Crestor Take 1 tablet (5 mg total) by mouth every other day. At bedtime.   Saw Palmetto 450 MG Caps Take  3 capsules by mouth daily.   Shingrix injection Generic drug: Zoster Vaccine Adjuvanted Inject 0.5 mL into muscle for shingles vaccine. Repeat dose in 2-6 months.        Disposition:  Discharge Instructions     Amb referral to AFIB Clinic   Complete by: As directed        Follow-up Information     Smitty Cords, DO Follow up.   Specialty: Family Medicine Contact information: [address] [place] [address] [phone]         [facility] Follow up.   Specialty: Cardiology Why: on 7/3 at 4 pm for post hospital follow up Contact information: [address], [address] [id number] [address] [address] [phone]                Duration of Discharge Encounter: Greater than 30 minutes including physician time.  Dustin Flock, PA-C  10/21/2022 4:35 PM

## 2022-10-21 NOTE — TOC Initial Note (Signed)
Transition of Care [facility]) - Initial/Assessment Note    Patient Details  Name: Thomas [name] MRN: [id number] Date of Birth: 08-Mar-1946  Transition of Care [facility]) CM/SW Contact:    Leone Haven, RN Phone Number: 10/21/2022, 4:01 PM  Clinical Narrative:                 From home with wife, indep, he has PCP and insurance on file.  He has no DME that he is currently using at home.  He states his insurance has a Nurse they send out once a year to check on him, but he does not have any HH services at this time.  His wife will transport him home at [employer], he gets his medications from [place] on [address]    Expected Discharge Plan: Home/Self Care Barriers to Discharge: Continued Medical Work up   Patient Goals and CMS Choice Patient states their goals for this hospitalization and ongoing recovery are:: return home with wife   Choice offered to / list presented to : NA      Expected Discharge Plan and Services In-house Referral: NA Discharge Planning Services: CM Consult Post Acute Care Choice: NA Living arrangements for the past 2 months: Single Family Home                 DME Arranged: N/A DME Agency: NA       HH Arranged: NA          Prior Living Arrangements/Services Living arrangements for the past 2 months: Single Family Home Lives with:: Spouse Patient language and need for interpreter reviewed:: Yes Do you feel safe going back to the place where you live?: Yes      Need for Family Participation in Patient Care: Yes (Comment) Care giver support system in place?: Yes (comment)   Criminal Activity/Legal Involvement Pertinent to Current Situation/Hospitalization: No - Comment as needed  Activities of Daily Living Home Assistive Devices/Equipment: None ADL Screening (condition at time of admission) Patient's cognitive ability adequate to safely complete daily activities?: Yes Is the patient deaf or have difficulty hearing?: No Does the patient have  difficulty seeing, even when wearing glasses/contacts?: No Does the patient have difficulty concentrating, remembering, or making decisions?: No Patient able to express need for assistance with ADLs?: Yes Does the patient have difficulty dressing or bathing?: No Independently performs ADLs?: Yes (appropriate for developmental age) Does the patient have difficulty walking or climbing stairs?: No Weakness of Legs: None Weakness of Arms/Hands: None  Permission Sought/Granted                  Emotional Assessment Appearance:: Appears stated age Attitude/Demeanor/Rapport: Engaged Affect (typically observed): Appropriate Orientation: : Oriented to Self, Oriented to Place, Oriented to  Time, Oriented to Situation Alcohol / Substance Use: Not Applicable Psych Involvement: No (comment)  Admission diagnosis:  Syncope [R55] Patient Active Problem List   Diagnosis Date Noted   Syncope 10/20/2022   Bradycardia 10/19/2022   Syncope and collapse 10/19/2022   Second degree heart block 10/18/2022   Troponin I above reference range 10/18/2022   Atrial fibrillation, chronic (HCC) 10/18/2022   Heart block 10/18/2022   Allergic conjunctivitis and rhinitis 06/09/2017   Paroxysmal atrial fibrillation (HCC) 02/12/2017   Colon cancer screening 11/30/2016   Dysphagia 11/25/2015   Elevated hemoglobin A1c 11/25/2015   Arrhythmia 09/23/2015   Medication monitoring encounter 01/20/2015   Anxiety 11/20/2014   Essential hypertension    Hyperlipidemia    Arthritis  Insomnia    BPH (benign prostatic hyperplasia)    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 ([address]), [place] - [address] [place] ([place]) [place] [address] Phone: [phone] Fax: [phone]     Social Determinants of Health (SDOH) Social History: SDOH Screenings   Food Insecurity: No Food Insecurity  (10/20/2022)  Housing: Low Risk  (10/20/2022)  Transportation Needs: No Transportation Needs (10/20/2022)  Utilities: Not At Risk (10/20/2022)  Alcohol Screen: Low Risk  (09/19/2022)  Depression (PHQ2-9): Low Risk  (09/19/2022)  Financial Resource Strain: Low Risk  (02/11/2022)  Physical Activity: Sufficiently Active (02/11/2022)  Stress: No Stress Concern Present (02/11/2022)  Tobacco Use: Medium Risk (10/18/2022)   SDOH Interventions:     Readmission Risk Interventions     No data to display

## 2022-10-21 NOTE — TOC Transition Note (Signed)
Transition of Care [facility]) - CM/SW Discharge Note   Patient Details  Name: Thomas [name] MRN: [id number] Date of Birth: Jul 09, 1945  Transition of Care [facility]) CM/SW Contact:  Leone Haven, RN Phone Number: 10/21/2022, 4:36 PM   Clinical Narrative:    Patient is for dc today, has no needs.     Barriers to Discharge: Continued Medical Work up   Patient Goals and CMS Choice   Choice offered to / list presented to : NA  Discharge Placement                         Discharge Plan and Services Additional resources added to the After Visit Summary for   In-house Referral: NA Discharge Planning Services: CM Consult Post Acute Care Choice: NA          DME Arranged: N/A DME Agency: NA       HH Arranged: NA          Social Determinants of Health (SDOH) Interventions SDOH Screenings   Food Insecurity: No Food Insecurity (10/20/2022)  Housing: Low Risk  (10/20/2022)  Transportation Needs: No Transportation Needs (10/20/2022)  Utilities: Not At Risk (10/20/2022)  Alcohol Screen: Low Risk  (09/19/2022)  Depression (PHQ2-9): Low Risk  (09/19/2022)  Financial Resource Strain: Low Risk  (02/11/2022)  Physical Activity: Sufficiently Active (02/11/2022)  Stress: No Stress Concern Present (02/11/2022)  Tobacco Use: Medium Risk (10/18/2022)     Readmission Risk Interventions     No data to display

## 2022-10-21 NOTE — Consult Note (Signed)
   [facility] Inpatient Consult   10/21/2022  AYODEJI KEIMIG 10-03-1945 [id number]   [facility] [[facility]]  Accountable Care Organization [ACO] Patient: Monia Pouch Medicare   Primary Care Provider:  Smitty Cords, DO with [facility]   Patient screened for less than 7 days readmission hospitalization with noted low risk score for unplanned readmission risk  and to assess for potential [facility]  [[facility]] Care Management service needs for post hospital transition for care coordination.  Review of patient's electronic medical record reveals patient is for a procedure today.  Brief review for post hospital community needs. Patient back to room post op Pacemaker.  Explained reason for rounding visit on behalf of [facility] for any post hospital follow up needs.  Patient endorses PCP and states feeling ok but a little pain and felt like he only needed Tylenol.  Spoke with unit RN about patient request.  No other needs requested.  Patient given an appointment reminder card with a 24 hour nurse advise line and was gracious and accepting of information.  Verbalized understanding.   Plan:  Continue to follow progress and disposition to assess for post hospital community care coordination/management needs.  Referral request for community care coordination:  None currently, continue to follow.  Of note, [facility] Care Management/Population Health does not replace or interfere with any arrangements made by the Inpatient Transition of Care team.  For questions contact:   Charlesetta Shanks, RN BSN CCM [facility]  [phone] business mobile phone Toll free office [phone]  *Concierge Line  [phone] Fax number: [phone] [email or web address]

## 2022-10-21 NOTE — Discharge Instructions (Signed)
After Your Pacemaker   You have a Abbott Pacemaker  ACTIVITY Do not lift your arm above shoulder height for 1 week after your procedure. After 7 days, you may progress as below.  You should remove your sling 24 hours after your procedure, unless otherwise instructed by your provider.     Friday October 28, 2022  Saturday October 29, 2022 Sunday October 30, 2022 Monday October 31, 2022   Do not lift, push, pull, or carry anything over 10 pounds with the affected arm until 6 weeks (Friday December 02, 2022 ) after your procedure.   You may drive AFTER your wound check, unless you have been told otherwise by your provider.   Ask your healthcare provider when you can go back to work   INCISION/Dressing Resume your Eliquis Wednesday, 10/26/2022  If large square, outer bandage is left in place, this can be removed after 24 hours from your procedure. Do not remove steri-strips or glue as below.   If a PRESSURE DRESSING (a bulky dressing that usually goes up over your shoulder) was applied or left in place, please follow instructions given by your provider on when to return to have this removed.   Monitor your Pacemaker site for redness, swelling, and drainage. Call the device clinic at [phone] if you experience these symptoms or fever/chills.  If your incision is closed with Dermabond/Surgical glue. You may shower 1 day after your pacemaker implant and wash around the site with soap and water.    If you were discharged in a sling, please do not wear this during the day more than 48 hours after your surgery unless otherwise instructed. This may increase the risk of stiffness and soreness in your shoulder.   Avoid lotions, ointments, or perfumes over your incision until it is well-healed.  You may use a hot tub or a pool AFTER your wound check appointment if the incision is completely closed.  Pacemaker Alerts:  Some alerts are vibratory and others beep. These are NOT emergencies. Please call our  office to let us know. If this occurs at night or on weekends, it can wait until the next business day. Send a remote transmission.  If your device is capable of reading fluid status (for heart failure), you will be offered monthly monitoring to review this with you.   DEVICE MANAGEMENT Remote monitoring is used to monitor your pacemaker from home. This monitoring is scheduled every 91 days by our office. It allows [place] to keep an eye on the functioning of your device to ensure it is working properly. You will routinely see your Electrophysiologist annually (more often if necessary).   You should receive your ID card for your new device in 4-8 weeks. Keep this card with you at all times once received. Consider wearing a medical alert bracelet or necklace.  Your device is NOT MRI compatible at [employer] not use amateur (ham) radio equipment or electric (arc) welding torches. MP3 player headphones with magnets should not be used. Some devices are safe to use if held at least 12 inches (30 cm) from your Pacemaker. These include power tools, lawn mowers, and speakers. If you are unsure if something is safe to use, ask your health care provider.  When using your cell phone, hold it to the ear that is on the opposite side from the Pacemaker. Do not leave your cell phone in a pocket over the Pacemaker.  You may safely use electric blankets, heating pads, computers, and  microwave ovens.  Call the office right away if: You have chest pain. You feel more short of breath than you have felt before. You feel more light-headed than you have felt before. Your incision starts to open up.  This information is not intended to replace advice given to you by your health care provider. Make sure you discuss any questions you have with your health care provider.

## 2022-10-21 NOTE — Consult Note (Signed)
See other note Consult at request of dr Knute Neu /CE for syncope and persistent bradycardia

## 2022-10-24 ENCOUNTER — Telehealth: Payer: Self-pay | Admitting: *Deleted

## 2022-10-24 ENCOUNTER — Encounter (HOSPITAL_COMMUNITY): Payer: Self-pay | Admitting: Internal Medicine

## 2022-10-24 NOTE — Transitions of Care (Post Inpatient/ED Visit) (Signed)
10/24/2022  Name: Thomas [name] MRN: [id number] DOB: [birth year]  Today's TOC FU Call Status: Today's TOC FU Call Status:: Successful TOC FU Call Competed TOC FU Call Complete Date: 10/24/22  Transition Care Management Follow-up Telephone Call Date of Discharge: 10/21/22 Discharge Facility: [facility]) Type of Discharge: Inpatient Admission Primary Inpatient Discharge Diagnosis:: Syncope How have you been since you were released from the hospital?: Better Any questions or concerns?: No  Items Reviewed: Did you receive and understand the discharge instructions provided?: Yes Medications obtained,verified, and reconciled?: Yes (Medications Reviewed) Any new allergies since your discharge?: No Dietary orders reviewed?: No Do you have support at home?: Yes People in Home: spouse Name of Support/Comfort Primary Source: [name]  Medications Reviewed Today: Medications Reviewed Today     Reviewed by Luella Cook, RN (Case Manager) on 10/24/22 at 1130  Med List Status: <None>   Medication Order Taking? Sig Documenting Provider Last Dose Status Informant  acetaminophen (TYLENOL) 325 MG tablet [id number] Yes Take 1-2 tablets (325-650 mg total) by mouth every 4 (four) hours as needed for mild pain. Graciella Freer, PA-C Taking Active   apixaban (ELIQUIS) 5 MG TABS tablet [id number] Yes Take 1 tablet (5 mg total) by mouth 2 (two) times daily. Graciella Freer, PA-C Taking Active   atenolol (TENORMIN) 25 MG tablet [id number] Yes Take 1 tablet (25 mg total) by mouth daily. Smitty Cords, DO Taking Active Spouse/Significant Other  diltiazem (CARDIZEM) 30 MG tablet [id number] Yes Take 1 tablet (30 mg total) by mouth every 8 (eight) hours as needed (for heart rate greater than 100 beats per minute). Smitty Cords, DO Taking Active Spouse/Significant Other  rosuvastatin (CRESTOR) 5 MG tablet [id number] Yes Take 1 tablet (5 mg total) by mouth every other day.  At bedtime. Smitty Cords, DO Taking Active Spouse/Significant Other  Saw Palmetto 450 MG CAPS [id number] Yes Take 3 capsules by mouth daily. [provider] Taking Active Spouse/Significant Other  SHINGRIX injection [id number] Yes Inject 0.5 mL into muscle for shingles vaccine. Repeat dose in 2-6 months. Smitty Cords, DO Taking Active Spouse/Significant Other            Home Care and Equipment/Supplies: Were Home Health Services Ordered?: No Any new equipment or medical supplies ordered?: No  Functional Questionnaire: Do you need assistance with bathing/showering or dressing?: No Do you need assistance with meal preparation?: Yes Do you need assistance with eating?: No Do you have difficulty maintaining continence: No Do you need assistance with getting out of bed/getting out of a chair/moving?: No Do you have difficulty managing or taking your medications?: No  Follow up appointments reviewed: PCP Follow-up appointment confirmed?: Yes Date of PCP follow-up appointment?: 10/31/22 Follow-up Provider: [facility] Follow-up appointment confirmed?: Yes Date of Specialist follow-up appointment?: 11/02/22 Follow-Up Specialty Provider:: Ulyses Southward [id number],  [id number] Dorette Grate Do you need transportation to your follow-up appointment?: No Do you understand care options if your condition(s) worsen?: Yes-patient verbalized understanding  SDOH Interventions Today    Flowsheet Row Most Recent Value  SDOH Interventions   Food Insecurity Interventions Intervention Not Indicated  Housing Interventions Intervention Not Indicated  Transportation Interventions Intervention Not Indicated, Patient Resources (Friends/Family)      Interventions Today    Flowsheet Row Most Recent Value  General Interventions   General Interventions Discussed/Reviewed General Interventions Discussed, General Interventions Reviewed  Pharmacy  Interventions   Pharmacy Dicussed/Reviewed Pharmacy Topics Discussed, Pharmacy Topics Reviewed  TOC Interventions Today    Flowsheet Row Most Recent Value  TOC Interventions   TOC Interventions Discussed/Reviewed TOC Interventions Discussed, TOC Interventions Reviewed, Arranged PCP follow up within 7 days/Care Guide scheduled       Gean Maidens BSN RN [facility] [phone]

## 2022-10-24 NOTE — Transitions of Care (Post Inpatient/ED Visit) (Signed)
   10/24/2022  Name: TREVAUGHN SCHEAR MRN: [id number] DOB: 1945/07/16  Today's TOC FU Call Status: Today's TOC FU Call Status:: Unsuccessul Call (1st Attempt) Unsuccessful Call (1st Attempt) Date: 10/24/22  Attempted to reach the patient regarding the most recent Inpatient/ED visit.  Follow Up Plan: Additional outreach attempts will be made to reach the patient to complete the Transitions of Care (Post Inpatient/ED visit) call.   Gean Maidens BSN RN [facility] [phone]

## 2022-10-31 ENCOUNTER — Encounter: Payer: Self-pay | Admitting: Family Medicine

## 2022-10-31 ENCOUNTER — Ambulatory Visit (INDEPENDENT_AMBULATORY_CARE_PROVIDER_SITE_OTHER): Payer: Medicare HMO | Admitting: Family Medicine

## 2022-10-31 VITALS — BP 144/70 | HR 58 | Temp 99.1°F | Resp 18 | Ht 69.0 in | Wt 168.2 lb

## 2022-10-31 DIAGNOSIS — I10 Essential (primary) hypertension: Secondary | ICD-10-CM

## 2022-10-31 DIAGNOSIS — R001 Bradycardia, unspecified: Secondary | ICD-10-CM | POA: Diagnosis not present

## 2022-10-31 NOTE — Progress Notes (Signed)
Subjective:    Patient ID: Thomas [name], male    DOB: 10/03/1945, 77 y.o.   MRN: [id number]  Thomas [name] is a 77 y.o. male presenting on 10/31/2022 for Hospitalization Follow-up (Pt was seen in the hospital for syncope and collapse. Also persistent bradycardia , pacemaker implanted 10/21/2022. Pt comes in complaining of elevated blood pressure reading over the past 5 days )   HPI  HOSPITAL FOLLOW-UP VISIT  Hospital/Location: [facility] > Transfer to [facility] Date of Admission: 10/18/22 > Date of Transfer 10/20/22 to [facility] Date of Discharge: 10/21/22 Transitions of care telephone call: Completed on 10/24/22 by Gean Maidens RN  Reason for Admission: Bradycardia, symptomatic  - Hospital H&P and Discharge Summary have been reviewed - Patient presents today 10 days after recent hospitalization. Brief summary of recent course, patient had symptoms of admitted to [facility] with recurrent syncope and found to have 2:1 Atrial Ventricular Block and symptomatic bradycardia. Cardiology was involved and they decided to proceed w/ Pacemaker placement. Thomas [name] was transferred to [facility] to EP team and they proceeded with PPM pacemaker 10/21/22 by Dr Graciela Husbands. Thomas [name] was resumed on Eliquis on 6/26  - Today reports overall has done well after discharge. Symptoms of syncope have resolved, and his heart rate has been managed on device at 58-60 HR. They are able to monitor it.  Thomas [name] has an upcoming apt with Cardiology 7/3 for pacer check and 7/10 w/ Ward Givens NP and then again October w/ Dr Graciela Husbands EP  Thomas [name] has Diltiazem on rx list but has never needed it for tachycardia.  - New medications on discharge: None - Changes to current meds on discharge: Atenolol was held during hospital. Then restarted after proven it was not causing or provoking the bradycardia.  Home BP readings  AM before medications 172/91, 181/90, 171/88, 171/85, 189/93, 59-62  8pm approximately repeat reading 136/74, 147/73, 152/80, 148/79, HR 60-62  I have  reviewed the discharge medication list, and have reconciled the current and discharge medications today.   Current Outpatient Medications:    acetaminophen (TYLENOL) 325 MG tablet, Take 1-2 tablets (325-650 mg total) by mouth every 4 (four) hours as needed for mild pain., Disp: , Rfl:    apixaban (ELIQUIS) 5 MG TABS tablet, Take 1 tablet (5 mg total) by mouth 2 (two) times daily., Disp: 180 tablet, Rfl: 3   atenolol (TENORMIN) 25 MG tablet, Take 1 tablet (25 mg total) by mouth daily., Disp: 90 tablet, Rfl: 3   diltiazem (CARDIZEM) 30 MG tablet, Take 1 tablet (30 mg total) by mouth every 8 (eight) hours as needed (for heart rate greater than 100 beats per minute)., Disp: 30 tablet, Rfl: 2   rosuvastatin (CRESTOR) 5 MG tablet, Take 1 tablet (5 mg total) by mouth every other day. At bedtime., Disp: 45 tablet, Rfl: 3   Saw Palmetto 450 MG CAPS, Take 3 capsules by mouth daily., Disp: , Rfl:    SHINGRIX injection, Inject 0.5 mL into muscle for shingles vaccine. Repeat dose in 2-6 months., Disp: 0.5 mL, Rfl: 1  ------------------------------------------------------------------------- Social History   Tobacco Use   Smoking status: Former    Types: Cigarettes    Quit date: 05/02/1970    Years since quitting: 52.5   Smokeless tobacco: Never  Vaping Use   Vaping Use: Never used  Substance Use Topics   Alcohol use: No   Drug use: No    Review of Systems Per HPI unless specifically indicated above     Objective:  BP (!) 144/70 (BP Location: Left Arm, Patient Position: Sitting, Cuff Size: Normal)   Pulse (!) 58   Temp 99.1 F (37.3 C) (Oral)   Resp 18   Ht 5\' 9"  (1.753 m)   Wt 168 lb 3.2 oz (76.3 kg)   SpO2 98%   BMI 24.84 kg/m   Wt Readings from Last 3 Encounters:  10/31/22 168 lb 3.2 oz (76.3 kg)  10/20/22 172 lb 6.4 oz (78.2 kg)  10/19/22 172 lb 6.4 oz (78.2 kg)    Physical Exam Vitals and nursing note reviewed.  Constitutional:      General: Thomas [name] is not in acute distress.     Appearance: Normal appearance. Thomas [name] is well-developed. Thomas [name] is not diaphoretic.     Comments: Well-appearing, comfortable, cooperative  HENT:     Head: Normocephalic and atraumatic.  Eyes:     General:        Right eye: No discharge.        Left eye: No discharge.     Conjunctiva/sclera: Conjunctivae normal.  Cardiovascular:     Rate and Rhythm: Normal rate.  Pulmonary:     Effort: Pulmonary effort is normal.  Skin:    General: Skin is warm and dry.     Findings: No erythema or rash.     Comments: Pacemaker L upper chest post op healing well  Neurological:     Mental Status: Thomas [name] is alert and oriented to person, place, and time.  Psychiatric:        Mood and Affect: Mood normal.        Behavior: Behavior normal.        Thought Content: Thought content normal.     Comments: Well groomed, good eye contact, normal speech and thoughts       Results for orders placed or performed during the hospital encounter of 10/20/22  Surgical PCR screen   Specimen: Nasal Mucosa; Nasal Swab  Result Value Ref Range   MRSA, PCR NEGATIVE NEGATIVE   Staphylococcus aureus NEGATIVE NEGATIVE  Basic metabolic panel  Result Value Ref Range   Sodium 134 (L) 135 - 145 mmol/L   Potassium 4.7 3.5 - 5.1 mmol/L   Chloride 99 98 - 111 mmol/L   CO2 24 22 - 32 mmol/L   Glucose, Bld 104 (H) 70 - 99 mg/dL   BUN 19 8 - 23 mg/dL   Creatinine, Ser 1.61 0.61 - 1.24 mg/dL   Calcium 8.9 8.9 - 09.6 mg/dL   GFR, Estimated >04 >54 mL/min   Anion gap 11 5 - 15  CBC  Result Value Ref Range   WBC 5.8 4.0 - 10.5 K/uL   RBC 3.92 (L) 4.22 - 5.81 MIL/uL   Hemoglobin 12.8 (L) 13.0 - 17.0 g/dL   HCT 09.8 (L) 11.9 - 14.7 %   MCV 96.2 80.0 - 100.0 fL   MCH 32.7 26.0 - 34.0 pg   MCHC 34.0 30.0 - 36.0 g/dL   RDW 82.9 56.2 - 13.0 %   Platelets 196 150 - 400 K/uL   nRBC 0.0 0.0 - 0.2 %  Protime-INR  Result Value Ref Range   Prothrombin Time 15.2 11.4 - 15.2 seconds   INR 1.2 0.8 - 1.2  Glucose, capillary  Result Value  Ref Range   Glucose-Capillary 96 70 - 99 mg/dL  Glucose, capillary  Result Value Ref Range   Glucose-Capillary 98 70 - 99 mg/dL      Assessment & Plan:   Problem List Items Addressed  This Visit     Bradycardia - Primary   Essential hypertension    HFU Symptomatic bradycardia, 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  Thomas [name] was on Atenolol prior to procedure and they tested him off of med in hospital, found there was not a reversible cause of his bradycardia.  Thomas [name] was resumed on Atenolol, we discussed today that this med is not ideal for solo hypertension management, especially in this setting. I suggest Amlodipine or ARB may be a better option, and will review with his Cardiologist prior to upcoming apt 11/09/22  Okay to keep taking current dose Atenolol  Check BP about 10am (after meds), as his BP readings PRIOR to medication in AM are high since Atenolol is not lasting 24 hours  Otherwise, keep up with pacer check on Weds and apt 7/10  And Dr Graciela Husbands in October.  No orders of the defined types were placed in this encounter.   Follow up plan: Return in about 5 months (around 04/02/2023) for 5 month Annual Physical mid morning apt - labs AFTER.  Message Ward Givens NP and ask about switching Atenolol 25mg  over to Amlodipine or ARB for better BP control now with pacemaker, and also has high BP in AM before takes med, seems atenolol is not lasting 24 hours.   Saralyn Pilar, DO [facility] 10/31/2022, 2:00 PM

## 2022-10-31 NOTE — Patient Instructions (Addendum)
Thank you for coming to the office today.  For now keep on Atenolol  Check BP about 10am (after meds)  I will talk to Ward Givens NP about the switch of BP medication from Atenolol maybe to Amlodipine, and get his advice. Someone should contact you by end of week.  Otherwise, keep up with pacer check on Weds and apt 7/10  And Dr Graciela Husbands in October.  DUE for FASTING BLOOD WORK (no food or drink after midnight before the lab appointment, only water or coffee without cream/sugar on the morning of)  SCHEDULE "Lab Only" visit in the morning at the clinic for lab draw in 5 MONTHS   - Make sure Lab Only appointment is at about 1 week before your next appointment, so that results will be available  For Lab Results, once available within 2-3 days of blood draw, you can can log in to MyChart online to view your results and a brief explanation. Also, we can discuss results at next follow-up visit.   Please schedule a Follow-up Appointment to: Return in about 5 months (around 04/02/2023) for 5 month Annual Physical mid morning apt - labs AFTER.  If you have any other questions or concerns, please feel free to call the office or send a message through MyChart. You may also schedule an earlier appointment if necessary.  Additionally, you may be receiving a survey about your experience at our office within a few days to 1 week by e-mail or mail. We value your feedback.  Saralyn Pilar, DO [facility], [place]

## 2022-11-01 ENCOUNTER — Other Ambulatory Visit: Payer: Self-pay | Admitting: Family Medicine

## 2022-11-01 ENCOUNTER — Telehealth: Payer: Self-pay | Admitting: Family Medicine

## 2022-11-01 DIAGNOSIS — I10 Essential (primary) hypertension: Secondary | ICD-10-CM

## 2022-11-01 MED ORDER — CARVEDILOL 12.5 MG PO TABS
12.5000 mg | ORAL_TABLET | Freq: Two times a day (BID) | ORAL | 2 refills | Status: AC
Start: 2022-11-01 — End: ?

## 2022-11-01 NOTE — Telephone Encounter (Signed)
Please notify patient of medication change.  Patient was seen by me on 7/1. We discussed change of BP medication.  I got a response from patient's cardiology team today. They recommended switching Atenolol to Carvedilol for BP.  I have sent new rx to his pharmacy for Carvedilol 12.5mg , take one TWICE per day with meal. Sent #60 pills with refills.  He should STOP Atenolol and START Carvedilol. Once he has the new med.  He can discuss again with Cardiology on 11/09/22 for updates.  Saralyn Pilar, DO [facility] 11/01/2022, 5:57 PM

## 2022-11-02 ENCOUNTER — Telehealth: Payer: Self-pay | Admitting: Family Medicine

## 2022-11-02 ENCOUNTER — Ambulatory Visit: Payer: Medicare HMO | Attending: Internal Medicine

## 2022-11-02 DIAGNOSIS — I442 Atrioventricular block, complete: Secondary | ICD-10-CM

## 2022-11-02 LAB — CUP PACEART INCLINIC DEVICE CHECK
Battery Remaining Longevity: 68 mo
Battery Voltage: 3.04 V
Brady Statistic RA Percent Paced: 65 %
Brady Statistic RV Percent Paced: 87 %
Date Time Interrogation Session: 20240703200654
Implantable Lead Connection Status: 753985
Implantable Lead Connection Status: 753985
Implantable Lead Implant Date: 20240621
Implantable Lead Implant Date: 20240621
Implantable Lead Location: 753859
Implantable Lead Location: 753860
Implantable Lead Model: 3830
Implantable Pulse Generator Implant Date: 20240621
Lead Channel Impedance Value: 462.5 Ohm
Lead Channel Impedance Value: 700 Ohm
Lead Channel Pacing Threshold Amplitude: 0.5 V
Lead Channel Pacing Threshold Amplitude: 0.5 V
Lead Channel Pacing Threshold Amplitude: 0.5 V
Lead Channel Pacing Threshold Amplitude: 0.5 V
Lead Channel Pacing Threshold Pulse Width: 0.5 ms
Lead Channel Pacing Threshold Pulse Width: 0.5 ms
Lead Channel Pacing Threshold Pulse Width: 0.5 ms
Lead Channel Pacing Threshold Pulse Width: 0.5 ms
Lead Channel Sensing Intrinsic Amplitude: 12 mV
Lead Channel Sensing Intrinsic Amplitude: 2.1 mV
Lead Channel Setting Pacing Amplitude: 3.5 V
Lead Channel Setting Pacing Amplitude: 3.5 V
Lead Channel Setting Pacing Pulse Width: 0.5 ms
Lead Channel Setting Sensing Sensitivity: 2 mV
Pulse Gen Model: 2272
Pulse Gen Serial Number: 8179421

## 2022-11-02 NOTE — Telephone Encounter (Signed)
I attempted to contacted the patient, no answer. I left a detail message to return my call.

## 2022-11-02 NOTE — Patient Instructions (Signed)
   After Your Pacemaker   Monitor your pacemaker site for redness, swelling, and drainage. Call the device clinic at [phone] if you experience these symptoms or fever/chills.  Your incision was closed with Dermabond:  You may shower 1 day after your defibrillator implant and wash your incision with soap and water. Avoid lotions, ointments, or perfumes over your incision until it is well-healed.  You may use a hot tub or a pool after your wound check appointment if the incision is completely closed.  Do not lift, push or pull greater than 10 pounds with the affected arm until 6 weeks after your procedure. UNTIL AFTER AUGUST 2ND.  There are no other restrictions in arm movement after your wound check appointment.  You may drive, unless driving has been restricted by your healthcare providers.   Remote monitoring is used to monitor your pacemaker from home. This monitoring is scheduled every 91 days by our office. It allows [place] to keep an eye on the functioning of your device to ensure it is working properly. You will routinely see your Electrophysiologist annually (more often if necessary).

## 2022-11-02 NOTE — Telephone Encounter (Signed)
Pt's wife is calling in returning a call from [place].

## 2022-11-02 NOTE — Progress Notes (Signed)
Wound check appointment. Dermabond removed. Wound without redness or edema. Incision edges approximated, wound well healed. Normal device function. Thresholds, sensing, and impedances consistent with implant measurements. Device programmed at 3.5V/auto capture programmed on for extra safety margin until 3 month visit. Histogram distribution appropriate for patient and level of activity. 32 AMS episodes , all appear AF, longest 2 hours 15 mins, known chronic AF, patient is on OAC.  Ventricular rates are well controlled.  Patient educated about wound care, arm mobility, lifting restrictions. ROV in 3 months with implanting physician.

## 2022-11-07 NOTE — Telephone Encounter (Signed)
I called and spoke with the patient wife Thomas [name] and notified her of the providers recommendation. She verbalize understanding, no question or concern.

## 2022-11-09 ENCOUNTER — Encounter: Payer: Self-pay | Admitting: Nurse Practitioner

## 2022-11-09 ENCOUNTER — Ambulatory Visit: Payer: Medicare HMO | Attending: Nurse Practitioner | Admitting: Nurse Practitioner

## 2022-11-09 VITALS — BP 136/74 | HR 60 | Ht 69.5 in | Wt 169.0 lb

## 2022-11-09 DIAGNOSIS — I10 Essential (primary) hypertension: Secondary | ICD-10-CM | POA: Diagnosis not present

## 2022-11-09 DIAGNOSIS — I442 Atrioventricular block, complete: Secondary | ICD-10-CM

## 2022-11-09 DIAGNOSIS — I480 Paroxysmal atrial fibrillation: Secondary | ICD-10-CM | POA: Diagnosis not present

## 2022-11-09 DIAGNOSIS — E782 Mixed hyperlipidemia: Secondary | ICD-10-CM

## 2022-11-09 DIAGNOSIS — I491 Atrial premature depolarization: Secondary | ICD-10-CM | POA: Diagnosis not present
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
## 2022-11-09 NOTE — Patient Instructions (Signed)
Medication Instructions:  No changes *If you need a refill on your cardiac medications before your next appointment, please call your pharmacy*   Lab Work: None ordered If you have labs (blood work) drawn today and your tests are completely normal, you will receive your results only by: MyChart Message (if you have MyChart) OR A paper copy in the mail If you have any lab test that is abnormal or we need to change your treatment, we will call you to review the results.   Testing/Procedures: None ordered   Follow-Up: At [facility], you and your health needs are our priority.  As part of our continuing mission to provide you with exceptional heart care, we have created designated Provider Care Teams.  These Care Teams include your primary Cardiologist (physician) and Advanced Practice Providers (APPs -  Physician Assistants and Nurse Practitioners) who all work together to provide you with the care you need, when you need it.  We recommend signing up for the patient portal called "MyChart".  Sign up information is provided on this After Visit Summary.  MyChart is used to connect with patients for Virtual Visits (Telemedicine).  Patients are able to view lab/test results, encounter notes, upcoming appointments, etc.  Non-urgent messages can be sent to your provider as well.   To learn more about what you can do with MyChart, go to [email or web address].    Your next appointment:   4 month(s)  Provider:   You may see Julien Nordmann, MD or one of the following Advanced Practice Providers on your designated Care Team:   Nicolasa Ducking, NP Eula Listen, PA-C Cadence Fransico Michael, PA-C Charlsie Quest, NP

## 2023-01-24 ENCOUNTER — Ambulatory Visit: Payer: Medicare HMO

## 2023-01-24 DIAGNOSIS — I442 Atrioventricular block, complete: Secondary | ICD-10-CM | POA: Diagnosis not present

## 2023-01-24 DIAGNOSIS — I480 Paroxysmal atrial fibrillation: Secondary | ICD-10-CM

## 2023-01-24 LAB — CUP PACEART REMOTE DEVICE CHECK
Battery Remaining Longevity: 68 mo
Battery Remaining Percentage: 95.5 %
Battery Voltage: 3.02 V
Brady Statistic AP VP Percent: 32 %
Brady Statistic AP VS Percent: 6 %
Brady Statistic AS VP Percent: 56 %
Brady Statistic AS VS Percent: 5.1 %
Brady Statistic RA Percent Paced: 35 %
Brady Statistic RV Percent Paced: 87 %
Date Time Interrogation Session: 20240924032159
Implantable Lead Connection Status: 753985
Implantable Lead Connection Status: 753985
Implantable Lead Implant Date: 20240621
Implantable Lead Implant Date: 20240621
Implantable Lead Location: 753859
Implantable Lead Location: 753860
Implantable Lead Model: 3830
Implantable Pulse Generator Implant Date: 20240621
Lead Channel Impedance Value: 460 Ohm
Lead Channel Impedance Value: 650 Ohm
Lead Channel Pacing Threshold Amplitude: 0.5 V
Lead Channel Pacing Threshold Amplitude: 0.5 V
Lead Channel Pacing Threshold Pulse Width: 0.5 ms
Lead Channel Pacing Threshold Pulse Width: 0.5 ms
Lead Channel Sensing Intrinsic Amplitude: 12 mV
Lead Channel Sensing Intrinsic Amplitude: 2.2 mV
Lead Channel Setting Pacing Amplitude: 3.5 V
Lead Channel Setting Pacing Amplitude: 3.5 V
Lead Channel Setting Pacing Pulse Width: 0.5 ms
Lead Channel Setting Sensing Sensitivity: 2 mV
Pulse Gen Model: 2272
Pulse Gen Serial Number: 8179421

## 2023-01-31 ENCOUNTER — Other Ambulatory Visit: Payer: Self-pay | Admitting: Family Medicine

## 2023-01-31 ENCOUNTER — Encounter: Payer: Self-pay | Admitting: Internal Medicine

## 2023-01-31 ENCOUNTER — Ambulatory Visit: Payer: Medicare HMO | Attending: Internal Medicine | Admitting: Internal Medicine

## 2023-01-31 VITALS — BP 158/90 | HR 60 | Ht 69.5 in | Wt 170.0 lb

## 2023-01-31 DIAGNOSIS — I480 Paroxysmal atrial fibrillation: Secondary | ICD-10-CM | POA: Diagnosis not present

## 2023-01-31 DIAGNOSIS — I10 Essential (primary) hypertension: Secondary | ICD-10-CM

## 2023-01-31 DIAGNOSIS — I441 Atrioventricular block, second degree: Secondary | ICD-10-CM | POA: Diagnosis not present

## 2023-01-31 LAB — CUP PACEART INCLINIC DEVICE CHECK
Battery Remaining Longevity: 111 mo
Battery Voltage: 3.02 V
Brady Statistic RA Percent Paced: 36 %
Brady Statistic RV Percent Paced: 81 %
Date Time Interrogation Session: 20241001134706
Implantable Lead Connection Status: 753985
Implantable Lead Connection Status: 753985
Implantable Lead Implant Date: 20240621
Implantable Lead Implant Date: 20240621
Implantable Lead Location: 753859
Implantable Lead Location: 753860
Implantable Lead Model: 3830
Implantable Pulse Generator Implant Date: 20240621
Lead Channel Impedance Value: 462.5 Ohm
Lead Channel Impedance Value: 662.5 Ohm
Lead Channel Pacing Threshold Amplitude: 0.5 V
Lead Channel Pacing Threshold Amplitude: 0.5 V
Lead Channel Pacing Threshold Amplitude: 0.75 V
Lead Channel Pacing Threshold Amplitude: 0.75 V
Lead Channel Pacing Threshold Pulse Width: 0.5 ms
Lead Channel Pacing Threshold Pulse Width: 0.5 ms
Lead Channel Pacing Threshold Pulse Width: 0.5 ms
Lead Channel Pacing Threshold Pulse Width: 0.5 ms
Lead Channel Sensing Intrinsic Amplitude: 1.8 mV
Lead Channel Sensing Intrinsic Amplitude: 12 mV
Lead Channel Setting Pacing Amplitude: 2 V
Lead Channel Setting Pacing Amplitude: 2.5 V
Lead Channel Setting Pacing Pulse Width: 0.5 ms
Lead Channel Setting Sensing Sensitivity: 2 mV
Pulse Gen Model: 2272
Pulse Gen Serial Number: 8179421

## 2023-01-31 MED ORDER — AMLODIPINE BESYLATE 5 MG PO TABS: 5.0000 mg | ORAL_TABLET | Freq: Every day | ORAL | 3 refills | Status: DC

## 2023-01-31 NOTE — Patient Instructions (Signed)
Medication Instructions:  START Amlodipine 5 mg daily   *If you need a refill on your cardiac medications before your next appointment, please call your pharmacy*   Follow-Up: At [facility], you and your health needs are our priority.  As part of our continuing mission to provide you with exceptional heart care, we have created designated Provider Care Teams.  These Care Teams include your primary Cardiologist (physician) and Advanced Practice Providers (APPs -  Physician Assistants and Nurse Practitioners) who all work together to provide you with the care you need, when you need it.  We recommend signing up for the patient portal called "MyChart".  Sign up information is provided on this After Visit Summary.  MyChart is used to connect with patients for Virtual Visits (Telemedicine).  Patients are able to view lab/test results, encounter notes, upcoming appointments, etc.  Non-urgent messages can be sent to your provider as well.   To learn more about what you can do with MyChart, go to [email or web address].    Your next appointment:   9 month(s)  Provider:   Sherie Don, NP
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
## 2023-02-08 ENCOUNTER — Telehealth: Payer: Self-pay | Admitting: Family Medicine

## 2023-02-08 NOTE — Telephone Encounter (Signed)
REQ CB 02/13/2023 to sched AWV- 02/08/2023  Verlee Rossetti; Care Guide Ambulatory Clinical Support [place] [facility] Direct Dial: [phone]

## 2023-02-09 NOTE — Progress Notes (Signed)
Remote pacemaker transmission.   

## 2023-02-14 ENCOUNTER — Telehealth: Payer: Self-pay | Admitting: Family Medicine

## 2023-02-14 NOTE — Telephone Encounter (Signed)
Called LVM 02/14/2023 to schedule Annual Wellness Visit  Thomas [name]; Care Guide Ambulatory Clinical Support [place] [facility] Direct Dial: [phone]

## 2023-02-16 ENCOUNTER — Ambulatory Visit (INDEPENDENT_AMBULATORY_CARE_PROVIDER_SITE_OTHER): Payer: Medicare HMO

## 2023-02-16 DIAGNOSIS — Z0000 Encounter for general adult medical examination without abnormal findings: Secondary | ICD-10-CM | POA: Diagnosis not present

## 2023-02-16 NOTE — Patient Instructions (Addendum)
Thomas [name] , Thank you for taking time to come for your Medicare Wellness Visit. I appreciate your ongoing commitment to your health goals. Please review the following plan we discussed and let me know if I can assist you in the future.   Referrals/Orders/Follow-Ups/Clinician Recommendations: none  This is a list of the screening recommended for you and due dates:  Health Maintenance  Topic Date Due   Zoster (Shingles) Vaccine (1 of 2) 12/14/1993   DTaP/Tdap/Td vaccine (5 - Td or Tdap) 06/13/2021   COVID-19 Vaccine (4 - 2023-24 season) 01/01/2023   Medicare Annual Wellness Visit  02/16/2024   Pneumonia Vaccine  Completed   Flu Shot  Completed   Hepatitis C Screening  Completed   HPV Vaccine  Aged Out   Cologuard (Stool DNA test)  Discontinued    Advanced directives: (ACP Link)Information on Advanced Care Planning can be found at [facility] Advance Health Care Directives Advance Health Care Directives ([email or web address]   Next Medicare Annual Wellness Visit scheduled for next year: Yes   02/22/24 @ 8:55 am in person
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
## 2023-03-13 ENCOUNTER — Ambulatory Visit: Payer: Medicare HMO | Attending: Cardiovascular Disease | Admitting: Cardiovascular Disease

## 2023-03-13 ENCOUNTER — Encounter: Payer: Self-pay | Admitting: Cardiovascular Disease

## 2023-03-13 VITALS — BP 120/70 | HR 61 | Ht 69.0 in | Wt 174.2 lb

## 2023-03-13 DIAGNOSIS — I442 Atrioventricular block, complete: Secondary | ICD-10-CM

## 2023-03-13 DIAGNOSIS — R55 Syncope and collapse: Secondary | ICD-10-CM | POA: Diagnosis not present

## 2023-03-13 DIAGNOSIS — I441 Atrioventricular block, second degree: Secondary | ICD-10-CM | POA: Diagnosis not present

## 2023-03-13 DIAGNOSIS — I480 Paroxysmal atrial fibrillation: Secondary | ICD-10-CM | POA: Diagnosis not present

## 2023-03-13 DIAGNOSIS — E782 Mixed hyperlipidemia: Secondary | ICD-10-CM

## 2023-03-13 DIAGNOSIS — I10 Essential (primary) hypertension: Secondary | ICD-10-CM

## 2023-03-13 MED ORDER — AMLODIPINE BESYLATE 2.5 MG PO TABS: 2.5000 mg | ORAL_TABLET | Freq: Every day | ORAL | 3 refills | Status: DC

## 2023-03-13 NOTE — Patient Instructions (Signed)
Medication Instructions:  Please decrease the amlodipine down to 2.5 daily,  Take extra amlodipine 2.5 daily for pressure >150   If you need a refill on your cardiac medications before your next appointment, please call your pharmacy.   Lab work: No new labs needed  Testing/Procedures: No new testing needed  Follow-Up: At [facility], you and your health needs are our priority.  As part of our continuing mission to provide you with exceptional heart care, we have created designated Provider Care Teams.  These Care Teams include your primary Cardiologist (physician) and Advanced Practice Providers (APPs -  Physician Assistants and Nurse Practitioners) who all work together to provide you with the care you need, when you need it.  You will need a follow up appointment in 12 months  Providers on your designated Care Team:   Nicolasa Ducking, NP Eula Listen, PA-C Cadence Fransico Michael, [place]  COVID-19 Vaccine Information can be found at: [email or web address] For questions related to vaccine distribution or appointments, please email [email or web address] or call [phone].

## 2023-04-03 ENCOUNTER — Ambulatory Visit (INDEPENDENT_AMBULATORY_CARE_PROVIDER_SITE_OTHER): Payer: Medicare HMO | Admitting: Family Medicine

## 2023-04-03 ENCOUNTER — Encounter: Payer: Self-pay | Admitting: Family Medicine

## 2023-04-03 VITALS — BP 110/60 | HR 64 | Resp 15 | Ht 69.0 in | Wt 173.4 lb

## 2023-04-03 DIAGNOSIS — I10 Essential (primary) hypertension: Secondary | ICD-10-CM

## 2023-04-03 DIAGNOSIS — N400 Enlarged prostate without lower urinary tract symptoms: Secondary | ICD-10-CM

## 2023-04-03 DIAGNOSIS — R7309 Other abnormal glucose: Secondary | ICD-10-CM | POA: Diagnosis not present

## 2023-04-03 DIAGNOSIS — Z0000 Encounter for general adult medical examination without abnormal findings: Secondary | ICD-10-CM

## 2023-04-03 DIAGNOSIS — E782 Mixed hyperlipidemia: Secondary | ICD-10-CM | POA: Diagnosis not present

## 2023-04-03 MED ORDER — ROSUVASTATIN CALCIUM 5 MG PO TABS: 5.0000 mg | ORAL_TABLET | ORAL | 3 refills | Status: DC

## 2023-04-03 NOTE — Assessment & Plan Note (Signed)
Due for lab A1c Previously controlled

## 2023-04-03 NOTE — Assessment & Plan Note (Signed)
Controlled - Home BP readings reviewed  Paroxysmal AFib. S/p ablation   Plan:  1. Continue current BP regimen Amlodipine 2.5mg  daily, Carvedilol 12.5mg  TWICE A DAY - Note Diltiazem is only PRN if HR elevated 2. Encourage improved lifestyle - low sodium diet, regular exercise 3. Continue monitor BP outside office, bring readings to next visit, if persistently >140/90 or new symptoms notify office sooner

## 2023-04-03 NOTE — Assessment & Plan Note (Signed)
Controlled on current supplement Saw Palmetto Followed by Urology

## 2023-04-03 NOTE — Assessment & Plan Note (Signed)
Continue Taking Rosuvastatin (generic Crestor) 5mg  very low dose, every other night.
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
## 2023-04-03 NOTE — Patient Instructions (Addendum)
Thank you for coming to the office today.  Labs today, stay tuned for letter in the mail.  Refilled Cholesterol medication for 1 year.  BP looks great.   Please schedule a Follow-up Appointment to: Return in about 6 months (around 10/02/2023) for 6 month follow-up updates.  If you have any other questions or concerns, please feel free to call the office or send a message through MyChart. You may also schedule an earlier appointment if necessary.  Additionally, you may be receiving a survey about your experience at our office within a few days to 1 week by e-mail or mail. We value your feedback.  Saralyn Pilar, DO [facility], [place]

## 2023-04-04 LAB — PSA: PSA: 0.56 ng/mL (ref <=4.00)

## 2023-04-04 LAB — COMPLETE METABOLIC PANEL WITH GFR
AG Ratio: 0.9 (calc) — ABNORMAL LOW (ref 1.0–2.5)
ALT: 11 U/L (ref 9–46)
AST: 14 U/L (ref 10–35)
Albumin: 3.5 g/dL — ABNORMAL LOW (ref 3.6–5.1)
Alkaline phosphatase (APISO): 42 U/L (ref 35–144)
BUN: 17 mg/dL (ref 7–25)
CO2: 28 mmol/L (ref 20–32)
Calcium: 8.9 mg/dL (ref 8.6–10.3)
Chloride: 96 mmol/L — ABNORMAL LOW (ref 98–110)
Creat: 0.97 mg/dL (ref 0.70–1.28)
Globulin: 3.7 g/dL (ref 1.9–3.7)
Glucose, Bld: 122 mg/dL (ref 65–139)
Potassium: 4.5 mmol/L (ref 3.5–5.3)
Sodium: 135 mmol/L (ref 135–146)
Total Bilirubin: 1 mg/dL (ref 0.2–1.2)
Total Protein: 7.2 g/dL (ref 6.1–8.1)
eGFR: 79 mL/min/{1.73_m2} (ref >=60)

## 2023-04-04 LAB — LIPID PANEL
Cholesterol: 135 mg/dL (ref <200)
HDL: 35 mg/dL — ABNORMAL LOW (ref >=40)
LDL Cholesterol (Calc): 80 mg/dL
Non-HDL Cholesterol (Calc): 100 mg/dL (ref <130)
Total CHOL/HDL Ratio: 3.9 (calc) (ref <5.0)
Triglycerides: 107 mg/dL (ref <150)

## 2023-04-04 LAB — CBC WITH DIFFERENTIAL/PLATELET
Absolute Lymphocytes: 1292 {cells}/uL (ref 850–3900)
Absolute Monocytes: 261 {cells}/uL (ref 200–950)
Basophils Absolute: 41 {cells}/uL (ref 0–200)
Basophils Relative: 0.9 %
Eosinophils Absolute: 252 {cells}/uL (ref 15–500)
Eosinophils Relative: 5.6 %
HCT: 40.5 % (ref 38.5–50.0)
Hemoglobin: 13.7 g/dL (ref 13.2–17.1)
MCH: 32.9 pg (ref 27.0–33.0)
MCHC: 33.8 g/dL (ref 32.0–36.0)
MCV: 97.4 fL (ref 80.0–100.0)
MPV: 10.2 fL (ref 7.5–12.5)
Monocytes Relative: 5.8 %
Neutro Abs: 2655 {cells}/uL (ref 1500–7800)
Neutrophils Relative %: 59 %
Platelets: 174 10*3/uL (ref 140–400)
RBC: 4.16 10*6/uL — ABNORMAL LOW (ref 4.20–5.80)
RDW: 13.4 % (ref 11.0–15.0)
Total Lymphocyte: 28.7 %
WBC: 4.5 10*3/uL (ref 3.8–10.8)

## 2023-04-04 LAB — HEMOGLOBIN A1C
Hgb A1c MFr Bld: 6.1 %{Hb} — ABNORMAL HIGH (ref <5.7)
Mean Plasma Glucose: 128 mg/dL
eAG (mmol/L): 7.1 mmol/L

## 2023-04-04 LAB — TSH: TSH: 1.26 m[IU]/L (ref 0.40–4.50)

## 2023-04-25 ENCOUNTER — Ambulatory Visit (INDEPENDENT_AMBULATORY_CARE_PROVIDER_SITE_OTHER): Payer: Medicare HMO

## 2023-04-25 DIAGNOSIS — I441 Atrioventricular block, second degree: Secondary | ICD-10-CM

## 2023-04-25 LAB — CUP PACEART REMOTE DEVICE CHECK
Battery Remaining Longevity: 110 mo
Battery Remaining Percentage: 95.5 %
Battery Voltage: 3.02 V
Brady Statistic AP VP Percent: 16 %
Brady Statistic AP VS Percent: 26 %
Brady Statistic AS VP Percent: 28 %
Brady Statistic AS VS Percent: 28 %
Brady Statistic RA Percent Paced: 38 %
Brady Statistic RV Percent Paced: 44 %
Date Time Interrogation Session: 20241224020018
Implantable Lead Connection Status: 753985
Implantable Lead Connection Status: 753985
Implantable Lead Implant Date: 20240621
Implantable Lead Implant Date: 20240621
Implantable Lead Location: 753859
Implantable Lead Location: 753860
Implantable Lead Model: 3830
Implantable Pulse Generator Implant Date: 20240621
Lead Channel Impedance Value: 460 Ohm
Lead Channel Impedance Value: 660 Ohm
Lead Channel Pacing Threshold Amplitude: 0.5 V
Lead Channel Pacing Threshold Amplitude: 0.75 V
Lead Channel Pacing Threshold Pulse Width: 0.5 ms
Lead Channel Pacing Threshold Pulse Width: 0.5 ms
Lead Channel Sensing Intrinsic Amplitude: 12 mV
Lead Channel Sensing Intrinsic Amplitude: 2.1 mV
Lead Channel Setting Pacing Amplitude: 2 V
Lead Channel Setting Pacing Amplitude: 2.5 V
Lead Channel Setting Pacing Pulse Width: 0.5 ms
Lead Channel Setting Sensing Sensitivity: 2 mV
Pulse Gen Model: 2272
Pulse Gen Serial Number: 8179421

## 2023-05-01 ENCOUNTER — Other Ambulatory Visit: Payer: Self-pay | Admitting: Family Medicine

## 2023-05-01 DIAGNOSIS — I10 Essential (primary) hypertension: Secondary | ICD-10-CM

## 2023-05-04 ENCOUNTER — Other Ambulatory Visit: Payer: Self-pay

## 2023-05-04 DIAGNOSIS — I10 Essential (primary) hypertension: Secondary | ICD-10-CM

## 2023-05-04 MED ORDER — CARVEDILOL 12.5 MG PO TABS
12.5000 mg | ORAL_TABLET | Freq: Two times a day (BID) | ORAL | 0 refills | Status: DC
Start: 2023-05-04 — End: 2023-05-31

## 2023-05-05 NOTE — Telephone Encounter (Signed)
 Requested Prescriptions  Refused Prescriptions Disp Refills   carvedilol  (COREG ) 12.5 MG tablet [Pharmacy Med Name: Carvedilol  12.5 MG Oral Tablet] 180 tablet 0    Sig: TAKE 1 TABLET BY MOUTH TWICE DAILY WITH A MEAL, STOP ATENOLOL  AND START CARVEDILOL      Cardiovascular: Beta Blockers 3 Passed - 05/05/2023 12:28 PM      Passed - Cr in normal range and within 360 days    Creat  Date Value Ref Range Status  04/03/2023 0.97 0.70 - 1.28 mg/dL Final         Passed - AST in normal range and within 360 days    AST  Date Value Ref Range Status  04/03/2023 14 10 - 35 U/L Final         Passed - ALT in normal range and within 360 days    ALT  Date Value Ref Range Status  04/03/2023 11 9 - 46 U/L Final         Passed - Last BP in normal range    BP Readings from Last 1 Encounters:  04/03/23 110/60         Passed - Last Heart Rate in normal range    Pulse Readings from Last 1 Encounters:  04/03/23 64         Passed - Valid encounter within last 6 months    Recent Outpatient Visits           1 month ago Annual physical exam   [facility] Marsa PARAS, DO   6 months ago Bradycardia   [facility] Marsa PARAS, DO   7 months ago Vasovagal syncope   [facility] Marsa PARAS, DO   1 year ago Annual physical exam   [facility] Marsa PARAS, DO   1 year ago Syncope, unspecified syncope type   [facility], Marsa PARAS, DO       Future Appointments             In 5 months Edman, Marsa PARAS, DO [place] [facility]

## 2023-05-24 ENCOUNTER — Other Ambulatory Visit: Payer: Self-pay

## 2023-05-24 ENCOUNTER — Emergency Department: Payer: Medicare HMO

## 2023-05-24 ENCOUNTER — Observation Stay
Admission: EM | Admit: 2023-05-24 | Discharge: 2023-05-25 | Payer: Medicare HMO | Attending: Family Medicine | Admitting: Family Medicine

## 2023-05-24 DIAGNOSIS — I6523 Occlusion and stenosis of bilateral carotid arteries: Secondary | ICD-10-CM | POA: Diagnosis not present

## 2023-05-24 DIAGNOSIS — R42 Dizziness and giddiness: Secondary | ICD-10-CM | POA: Diagnosis not present

## 2023-05-24 DIAGNOSIS — Z79899 Other long term (current) drug therapy: Secondary | ICD-10-CM | POA: Diagnosis not present

## 2023-05-24 DIAGNOSIS — R55 Syncope and collapse: Secondary | ICD-10-CM | POA: Diagnosis present

## 2023-05-24 DIAGNOSIS — I10 Essential (primary) hypertension: Secondary | ICD-10-CM | POA: Diagnosis present

## 2023-05-24 DIAGNOSIS — R7989 Other specified abnormal findings of blood chemistry: Secondary | ICD-10-CM

## 2023-05-24 DIAGNOSIS — N400 Enlarged prostate without lower urinary tract symptoms: Secondary | ICD-10-CM | POA: Diagnosis present

## 2023-05-24 DIAGNOSIS — Z950 Presence of cardiac pacemaker: Secondary | ICD-10-CM

## 2023-05-24 DIAGNOSIS — I480 Paroxysmal atrial fibrillation: Secondary | ICD-10-CM | POA: Diagnosis not present

## 2023-05-24 DIAGNOSIS — I4729 Other ventricular tachycardia: Secondary | ICD-10-CM | POA: Insufficient documentation

## 2023-05-24 DIAGNOSIS — J439 Emphysema, unspecified: Secondary | ICD-10-CM | POA: Diagnosis not present

## 2023-05-24 DIAGNOSIS — I442 Atrioventricular block, complete: Secondary | ICD-10-CM | POA: Diagnosis not present

## 2023-05-24 DIAGNOSIS — I4430 Unspecified atrioventricular block: Secondary | ICD-10-CM | POA: Diagnosis present

## 2023-05-24 DIAGNOSIS — Z87891 Personal history of nicotine dependence: Secondary | ICD-10-CM | POA: Diagnosis not present

## 2023-05-24 DIAGNOSIS — I700 Atherosclerosis of aorta: Secondary | ICD-10-CM | POA: Diagnosis not present

## 2023-05-24 DIAGNOSIS — I491 Atrial premature depolarization: Secondary | ICD-10-CM | POA: Diagnosis not present

## 2023-05-24 DIAGNOSIS — I499 Cardiac arrhythmia, unspecified: Secondary | ICD-10-CM | POA: Diagnosis not present

## 2023-05-24 DIAGNOSIS — R404 Transient alteration of awareness: Secondary | ICD-10-CM | POA: Diagnosis not present

## 2023-05-24 DIAGNOSIS — R410 Disorientation, unspecified: Secondary | ICD-10-CM | POA: Diagnosis not present

## 2023-05-24 DIAGNOSIS — Z743 Need for continuous supervision: Secondary | ICD-10-CM | POA: Diagnosis not present

## 2023-05-24 DIAGNOSIS — E785 Hyperlipidemia, unspecified: Secondary | ICD-10-CM | POA: Diagnosis not present

## 2023-05-24 DIAGNOSIS — I472 Ventricular tachycardia, unspecified: Secondary | ICD-10-CM

## 2023-05-24 LAB — URINALYSIS, ROUTINE W REFLEX MICROSCOPIC
Bilirubin Urine: NEGATIVE
Glucose, UA: 50 mg/dL — AB
Ketones, ur: NEGATIVE mg/dL
Leukocytes,Ua: NEGATIVE
Nitrite: NEGATIVE
Protein, ur: 30 mg/dL — AB
Specific Gravity, Urine: 1.012 (ref 1.005–1.030)
Squamous Epithelial / HPF: 0 /[HPF] (ref 0–5)
pH: 7 (ref 5.0–8.0)

## 2023-05-24 LAB — COMPREHENSIVE METABOLIC PANEL
ALT: 14 U/L (ref 0–44)
AST: 18 U/L (ref 15–41)
Albumin: 3.2 g/dL — ABNORMAL LOW (ref 3.5–5.0)
Alkaline Phosphatase: 52 U/L (ref 38–126)
Anion gap: 16 — ABNORMAL HIGH (ref 5–15)
BUN: 22 mg/dL (ref 8–23)
CO2: 25 mmol/L (ref 22–32)
Calcium: 8.7 mg/dL — ABNORMAL LOW (ref 8.9–10.3)
Chloride: 94 mmol/L — ABNORMAL LOW (ref 98–111)
Creatinine, Ser: 1.29 mg/dL — ABNORMAL HIGH (ref 0.61–1.24)
GFR, Estimated: 56 mL/min — ABNORMAL LOW (ref >60)
Glucose, Bld: 116 mg/dL — ABNORMAL HIGH (ref 70–99)
Potassium: 3.9 mmol/L (ref 3.5–5.1)
Sodium: 135 mmol/L (ref 135–145)
Total Bilirubin: 0.9 mg/dL (ref 0.0–1.2)
Total Protein: 7.8 g/dL (ref 6.5–8.1)

## 2023-05-24 LAB — CBC WITH DIFFERENTIAL/PLATELET
Abs Immature Granulocytes: 0.02 10*3/uL (ref 0.00–0.07)
Basophils Absolute: 0 10*3/uL (ref 0.0–0.1)
Basophils Relative: 0 %
Eosinophils Absolute: 0.2 10*3/uL (ref 0.0–0.5)
Eosinophils Relative: 3 %
HCT: 41.8 % (ref 39.0–52.0)
Hemoglobin: 14.2 g/dL (ref 13.0–17.0)
Immature Granulocytes: 0 %
Lymphocytes Relative: 30 %
Lymphs Abs: 2 10*3/uL (ref 0.7–4.0)
MCH: 32.9 pg (ref 26.0–34.0)
MCHC: 34 g/dL (ref 30.0–36.0)
MCV: 96.8 fL (ref 80.0–100.0)
Monocytes Absolute: 0.5 10*3/uL (ref 0.1–1.0)
Monocytes Relative: 7 %
Neutro Abs: 4 10*3/uL (ref 1.7–7.7)
Neutrophils Relative %: 60 %
Platelets: 148 10*3/uL — ABNORMAL LOW (ref 150–400)
RBC: 4.32 MIL/uL (ref 4.22–5.81)
RDW: 13.7 % (ref 11.5–15.5)
WBC: 6.7 10*3/uL (ref 4.0–10.5)
nRBC: 0 % (ref 0.0–0.2)

## 2023-05-24 LAB — CBG MONITORING, ED: Glucose-Capillary: 140 mg/dL — ABNORMAL HIGH (ref 70–99)

## 2023-05-24 LAB — MAGNESIUM: Magnesium: 2.2 mg/dL (ref 1.7–2.4)

## 2023-05-24 LAB — BRAIN NATRIURETIC PEPTIDE: B Natriuretic Peptide: 156.3 pg/mL — ABNORMAL HIGH (ref 0.0–100.0)

## 2023-05-24 LAB — TROPONIN I (HIGH SENSITIVITY): Troponin I (High Sensitivity): 16 ng/L (ref <18)

## 2023-05-24 MED ORDER — ONDANSETRON HCL 4 MG/2ML IJ SOLN
4.0000 mg | Freq: Once | INTRAMUSCULAR | Status: AC
  Administered 2023-05-24: 4 mg via INTRAVENOUS
  Filled 2023-05-24: qty 2

## 2023-05-24 NOTE — ED Provider Notes (Addendum)
[facility] Provider Note    None    (approximate)   History   Loss of Consciousness   HPI  Thomas [name] is a 78 y.o. male who presents to the ED for evaluation of Loss of Consciousness   Reviewed cardiology clinic visit from November.  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  Anticoagulated on Eliquis.  Patient presents to the ED due to a syncopal episode at home this evening.  Around 8 PM, he was watching television with his wife, got up from a seated position to get some ice cream for them and felt dizziness.  Had a syncopal episode with the wife witnessed after this.  He presents to the ED with ongoing dizziness and emesis that improves with Zofran administration.  Subsequently minimally symptomatic and reports feeling better.  No recent illnesses or other syncopal episodes/falls.   Physical Exam   Triage Vital Signs: ED Triage Vitals  Encounter Vitals Group     BP      Systolic BP Percentile      Diastolic BP Percentile      Pulse      Resp      Temp      Temp src      SpO2      Weight      Height      Head Circumference      Peak Flow      Pain Score      Pain Loc      Pain Education      Exclude from Growth Chart     Most recent vital signs: Vitals:   05/24/23 2200 05/24/23 2230  BP: 113/73 134/74  Pulse: 77 77  Resp: 18 (!) 24  Temp:    SpO2: 96% 96%    General: Awake, no distress. Pleasant and conversational CV:  Good peripheral perfusion.  Resp:  Normal effort.  Abd:  No distention.  MSK:  No deformity noted.  Neuro:  No focal deficits appreciated. Cranial nerves II through XII intact 5/5 strength and sensation in all 4 extremities Other:     ED Results / Procedures / Treatments   Labs (all labs ordered are listed, but only abnormal results are displayed) Labs Reviewed  COMPREHENSIVE METABOLIC PANEL - Abnormal; Notable for the following  components:      Result Value   Chloride 94 (*)    Glucose, Bld 116 (*)    Creatinine, Ser 1.29 (*)    Calcium 8.7 (*)    Albumin 3.2 (*)    GFR, Estimated 56 (*)    Anion gap 16 (*)    All other components within normal limits  CBC WITH DIFFERENTIAL/PLATELET - Abnormal; Notable for the following components:   Platelets 148 (*)    All other components within normal limits  BRAIN NATRIURETIC PEPTIDE - Abnormal; Notable for the following components:   B Natriuretic Peptide 156.3 (*)    All other components within normal limits  URINALYSIS, ROUTINE W REFLEX MICROSCOPIC - Abnormal; Notable for the following components:   Color, Urine STRAW (*)    APPearance CLEAR (*)    Glucose, UA 50 (*)    Hgb urine dipstick SMALL (*)    Protein, ur 30 (*)    Bacteria, UA RARE (*)    All other components within normal limits  CBG MONITORING, ED - Abnormal; Notable for the following components:  Glucose-Capillary 140 (*)    All other components within normal limits  MAGNESIUM  TROPONIN I (HIGH SENSITIVITY)  TROPONIN I (HIGH SENSITIVITY)    EKG Sinus rhythm the rate of 73 bpm.  Slightly prolonged PR interval at 235.  Right bundle.  Multiple PVCs.  No clear signs of a high-grade block.  No acute ischemic features. Fairly similar morphology as comparison from November, though no PVCs on that strip.  RADIOLOGY CXR interpreted by me without evidence of acute cardiopulmonary pathology. CT head interpreted by me without evidence of acute intracranial pathology  Official radiology report(s): CT HEAD WO CONTRAST ( ) Result Date: 05/24/2023 CLINICAL DATA:  Dizziness, syncopal episode EXAM: CT HEAD WITHOUT CONTRAST TECHNIQUE: Contiguous axial images were obtained from the base of the skull through the vertex without intravenous contrast. RADIATION DOSE REDUCTION: This exam was performed according to the departmental dose-optimization program which includes automated exposure control, adjustment of the  mA and/or kV according to patient size and/or use of iterative reconstruction technique. COMPARISON:  None Available. FINDINGS: Brain: No acute infarct or hemorrhage. Lateral ventricles and midline structures are unremarkable. No acute extra-axial fluid collections. No mass effect. Vascular: Atherosclerosis of the internal carotid arteries. No hyperdense vessel. Skull: Normal. Negative for fracture or focal lesion. Sinuses/Orbits: Mild mucosal thickening of the ethmoid air cells and left maxillary sinus. No gas fluid levels. Other: None. IMPRESSION: 1. No acute intracranial process. Electronically Signed   By: Sharlet Salina M.D.   On: 05/24/2023 22:57   DG Chest Portable 1 View Result Date: 05/24/2023 CLINICAL DATA:  Syncope.  Dysrhythmia. EXAM: PORTABLE CHEST 1 VIEW COMPARISON:  Chest radiograph dated 10/21/2022. FINDINGS: Left lung base atelectasis/scarring. Background of emphysema. No consolidative changes. There is no pleural effusion or pneumothorax. The cardiac silhouette is within limits. Atherosclerotic calcification of the aorta. Left pectoral pacemaker device. No acute osseous pathology. IMPRESSION: 1. No active disease. 2. Emphysema. Electronically Signed   By: Elgie Collard M.D.   On: 05/24/2023 21:37    PROCEDURES and INTERVENTIONS:  .1-3 Lead EKG Interpretation  Performed by: Delton Prairie, MD Authorized by: Delton Prairie, MD     Interpretation: normal     ECG rate:  77   ECG rate assessment: normal     Rhythm: sinus rhythm     Ectopy: PVCs     Conduction: normal     Medications  ondansetron (ZOFRAN) injection 4 mg (4 mg Intravenous Given 05/24/23 2136)     IMPRESSION / MDM / ASSESSMENT AND PLAN / ED COURSE  I reviewed the triage vital signs and the nursing notes.  Differential diagnosis includes, but is not limited to, vasovagal episode or orthostasis, cardiac dysrhythmia, pacemaker malfunction, dehydration, AKI, metabolic derangement such as sepsis.  {Patient presents  with symptoms of an acute illness or injury that is potentially life-threatening.  Pleasant patient presents from home after a syncopal episode with position change concerning for possibly orthostasis but needing to rule out hardware/cardiac pathology.  Hemodynamically stable and looks well after antiemetics on arrival.  Blood work with baseline renal function, normal electrolytes, normal CBC, urine without infectious features.  Negative first troponin.  Working on getting his device interrogated.  First troponin is negative and no clear dysrhythmias on the monitor.  I consult with cardiology, as below.  CT head is clear as well as CXR.  Pending second troponin and interrogation, signed out to oncoming physician.  The follow-up this is unremarkable and he is continuing to feel well, orthostasis is a possibility  and he may be suitable for outpatient management pending ambulation trial and remainder of workup.  Prior to me leaving, we are unable to effectively interrogate his device.  Apparently our machine cannot connect with what ever it needs to to function even when using the [employer] device.  Due to this and inability to prove his pacemaker is okay I think he would benefit from observation on telemetry overnight.  Clinical Course as of 05/24/23 2358  Wed May 24, 2023  2322 I consult with, cardiology, Dr. Duke Salvia and discussed patient's presentation and history.  She reports that Abbott pacemakers are new branding of [employer] and we can use our [employer] device to interrogate, which I was unaware of.  She reports this does not sound like his pacemaker and she is not concerned about the lack of pacer spikes on his twelve-lead due to the rate in the 70s.  She thinks this sounds more orthostatic and would not necessarily require admission to the hospital from a cardiology perspective, pending pacemaker interrogation. [DS]    Clinical Course User Index [DS] Delton Prairie, MD     FINAL CLINICAL  IMPRESSION(S) / ED DIAGNOSES   Final diagnoses:  Syncope and collapse     Rx / DC Orders   ED Discharge Orders     None        Note:  This document was prepared using Dragon voice recognition software and may include unintentional dictation errors.   Delton Prairie, MD 05/24/23 1610    Delton Prairie, MD 05/24/23 [phone]

## 2023-05-24 NOTE — ED Triage Notes (Signed)
Pt BIB EMS for syncopal episode. Patient was at home in his kitchen when he felt dizzy and collapsed. EMS reports positive LOC w/ no injuries noted. 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

## 2023-05-24 NOTE — ED Notes (Signed)
Patient provided warm blanket. Belongings placed in bag and given to family at the bedside.

## 2023-05-24 NOTE — ED Notes (Signed)
During triage patient vomited moderate amount

## 2023-05-24 NOTE — ED Notes (Signed)
CCMD contacted

## 2023-05-24 NOTE — ED Notes (Signed)
Fall precautions in place including arm band and socks. CB in reach. Family at the bedside. Pt understands to not get up without assistance.

## 2023-05-25 ENCOUNTER — Other Ambulatory Visit: Payer: Self-pay

## 2023-05-25 ENCOUNTER — Encounter: Payer: Self-pay | Admitting: Family Medicine

## 2023-05-25 ENCOUNTER — Observation Stay (HOSPITAL_BASED_OUTPATIENT_CLINIC_OR_DEPARTMENT_OTHER)
Admit: 2023-05-25 | Discharge: 2023-05-25 | Disposition: A | Discharge status: Home / Self Care | Payer: Medicare HMO | Attending: Family Medicine | Admitting: Family Medicine

## 2023-05-25 ENCOUNTER — Inpatient Hospital Stay (HOSPITAL_COMMUNITY)
Admission: AD | Admit: 2023-05-25 | Discharge: 2023-05-31 | DRG: 287 | Disposition: A | Discharge status: Home / Self Care | Payer: Medicare HMO | Source: Other Acute Inpatient Hospital | Attending: Family Medicine | Admitting: Family Medicine

## 2023-05-25 ENCOUNTER — Encounter (HOSPITAL_COMMUNITY): Payer: Self-pay

## 2023-05-25 DIAGNOSIS — I493 Ventricular premature depolarization: Secondary | ICD-10-CM | POA: Diagnosis present

## 2023-05-25 DIAGNOSIS — Z87891 Personal history of nicotine dependence: Secondary | ICD-10-CM | POA: Diagnosis not present

## 2023-05-25 DIAGNOSIS — E785 Hyperlipidemia, unspecified: Secondary | ICD-10-CM | POA: Diagnosis not present

## 2023-05-25 DIAGNOSIS — E872 Acidosis, unspecified: Secondary | ICD-10-CM | POA: Diagnosis not present

## 2023-05-25 DIAGNOSIS — Z950 Presence of cardiac pacemaker: Secondary | ICD-10-CM | POA: Diagnosis not present

## 2023-05-25 DIAGNOSIS — I480 Paroxysmal atrial fibrillation: Secondary | ICD-10-CM | POA: Diagnosis present

## 2023-05-25 DIAGNOSIS — Z880 Allergy status to penicillin: Secondary | ICD-10-CM

## 2023-05-25 DIAGNOSIS — I472 Ventricular tachycardia, unspecified: Principal | ICD-10-CM | POA: Diagnosis present

## 2023-05-25 DIAGNOSIS — R7989 Other specified abnormal findings of blood chemistry: Secondary | ICD-10-CM

## 2023-05-25 DIAGNOSIS — Z8249 Family history of ischemic heart disease and other diseases of the circulatory system: Secondary | ICD-10-CM | POA: Diagnosis not present

## 2023-05-25 DIAGNOSIS — N401 Enlarged prostate with lower urinary tract symptoms: Secondary | ICD-10-CM | POA: Diagnosis present

## 2023-05-25 DIAGNOSIS — F419 Anxiety disorder, unspecified: Secondary | ICD-10-CM | POA: Diagnosis not present

## 2023-05-25 DIAGNOSIS — Z9049 Acquired absence of other specified parts of digestive tract: Secondary | ICD-10-CM | POA: Diagnosis not present

## 2023-05-25 DIAGNOSIS — G4700 Insomnia, unspecified: Secondary | ICD-10-CM | POA: Diagnosis present

## 2023-05-25 DIAGNOSIS — Z833 Family history of diabetes mellitus: Secondary | ICD-10-CM | POA: Diagnosis not present

## 2023-05-25 DIAGNOSIS — N400 Enlarged prostate without lower urinary tract symptoms: Secondary | ICD-10-CM | POA: Diagnosis not present

## 2023-05-25 DIAGNOSIS — Z823 Family history of stroke: Secondary | ICD-10-CM | POA: Diagnosis not present

## 2023-05-25 DIAGNOSIS — R55 Syncope and collapse: Secondary | ICD-10-CM | POA: Diagnosis not present

## 2023-05-25 DIAGNOSIS — I10 Essential (primary) hypertension: Secondary | ICD-10-CM | POA: Diagnosis present

## 2023-05-25 DIAGNOSIS — I4430 Unspecified atrioventricular block: Secondary | ICD-10-CM | POA: Diagnosis not present

## 2023-05-25 DIAGNOSIS — D649 Anemia, unspecified: Secondary | ICD-10-CM | POA: Diagnosis not present

## 2023-05-25 DIAGNOSIS — Z83438 Family history of other disorder of lipoprotein metabolism and other lipidemia: Secondary | ICD-10-CM

## 2023-05-25 DIAGNOSIS — Z79899 Other long term (current) drug therapy: Secondary | ICD-10-CM

## 2023-05-25 DIAGNOSIS — Z7901 Long term (current) use of anticoagulants: Secondary | ICD-10-CM

## 2023-05-25 DIAGNOSIS — E8809 Other disorders of plasma-protein metabolism, not elsewhere classified: Secondary | ICD-10-CM | POA: Diagnosis not present

## 2023-05-25 DIAGNOSIS — I482 Chronic atrial fibrillation, unspecified: Secondary | ICD-10-CM | POA: Diagnosis present

## 2023-05-25 DIAGNOSIS — I2510 Atherosclerotic heart disease of native coronary artery without angina pectoris: Secondary | ICD-10-CM | POA: Diagnosis not present

## 2023-05-25 DIAGNOSIS — I441 Atrioventricular block, second degree: Secondary | ICD-10-CM | POA: Diagnosis not present

## 2023-05-25 DIAGNOSIS — Z888 Allergy status to other drugs, medicaments and biological substances status: Secondary | ICD-10-CM | POA: Diagnosis not present

## 2023-05-25 DIAGNOSIS — E871 Hypo-osmolality and hyponatremia: Secondary | ICD-10-CM | POA: Diagnosis not present

## 2023-05-25 LAB — ECHOCARDIOGRAM COMPLETE
AR max vel: 2.42 cm2
AV Area VTI: 2.57 cm2
AV Area mean vel: 2.12 cm2
AV Mean grad: 4 mm[Hg]
AV Peak grad: 6.9 mm[Hg]
Ao pk vel: 1.31 m/s
Area-P 1/2: 2.22 cm2
Calc EF: 54.1 %
Height: 70 in
MV VTI: 2.63 cm2
S' Lateral: 3.8 cm
Single Plane A2C EF: 59.1 %
Single Plane A4C EF: 52.3 %
Weight: 2704 [oz_av]

## 2023-05-25 LAB — APTT: aPTT: 37 s — ABNORMAL HIGH (ref 24–36)

## 2023-05-25 LAB — CBC WITH DIFFERENTIAL/PLATELET
Abs Immature Granulocytes: 0.03 10*3/uL (ref 0.00–0.07)
Basophils Absolute: 0 10*3/uL (ref 0.0–0.1)
Basophils Relative: 0 %
Eosinophils Absolute: 0 10*3/uL (ref 0.0–0.5)
Eosinophils Relative: 0 %
HCT: 36.2 % — ABNORMAL LOW (ref 39.0–52.0)
Hemoglobin: 12.4 g/dL — ABNORMAL LOW (ref 13.0–17.0)
Immature Granulocytes: 0 %
Lymphocytes Relative: 21 %
Lymphs Abs: 1.6 10*3/uL (ref 0.7–4.0)
MCH: 32.7 pg (ref 26.0–34.0)
MCHC: 34.3 g/dL (ref 30.0–36.0)
MCV: 95.5 fL (ref 80.0–100.0)
Monocytes Absolute: 0.5 10*3/uL (ref 0.1–1.0)
Monocytes Relative: 8 %
Neutro Abs: 5.1 10*3/uL (ref 1.7–7.7)
Neutrophils Relative %: 71 %
Platelets: 146 10*3/uL — ABNORMAL LOW (ref 150–400)
RBC: 3.79 MIL/uL — ABNORMAL LOW (ref 4.22–5.81)
RDW: 13.6 % (ref 11.5–15.5)
WBC: 7.2 10*3/uL (ref 4.0–10.5)
nRBC: 0 % (ref 0.0–0.2)

## 2023-05-25 LAB — PROTIME-INR
INR: 1.4 — ABNORMAL HIGH (ref 0.8–1.2)
Prothrombin Time: 17 s — ABNORMAL HIGH (ref 11.4–15.2)

## 2023-05-25 LAB — MAGNESIUM: Magnesium: 2.2 mg/dL (ref 1.7–2.4)

## 2023-05-25 LAB — COMPREHENSIVE METABOLIC PANEL
ALT: 13 U/L (ref 0–44)
AST: 17 U/L (ref 15–41)
Albumin: 2.9 g/dL — ABNORMAL LOW (ref 3.5–5.0)
Alkaline Phosphatase: 34 U/L — ABNORMAL LOW (ref 38–126)
Anion gap: 11 (ref 5–15)
BUN: 19 mg/dL (ref 8–23)
CO2: 24 mmol/L (ref 22–32)
Calcium: 8.6 mg/dL — ABNORMAL LOW (ref 8.9–10.3)
Chloride: 97 mmol/L — ABNORMAL LOW (ref 98–111)
Creatinine, Ser: 0.96 mg/dL (ref 0.61–1.24)
GFR, Estimated: >60 mL/min (ref >60)
Glucose, Bld: 108 mg/dL — ABNORMAL HIGH (ref 70–99)
Potassium: 4.2 mmol/L (ref 3.5–5.1)
Sodium: 132 mmol/L — ABNORMAL LOW (ref 135–145)
Total Bilirubin: 1.3 mg/dL — ABNORMAL HIGH (ref 0.0–1.2)
Total Protein: 7 g/dL (ref 6.5–8.1)

## 2023-05-25 LAB — PHOSPHORUS: Phosphorus: 3.2 mg/dL (ref 2.5–4.6)

## 2023-05-25 LAB — TROPONIN I (HIGH SENSITIVITY)
Troponin I (High Sensitivity): 39 ng/L — ABNORMAL HIGH (ref <18)
Troponin I (High Sensitivity): 39 ng/L — ABNORMAL HIGH (ref <18)
Troponin I (High Sensitivity): 48 ng/L — ABNORMAL HIGH (ref <18)

## 2023-05-25 MED ORDER — DILTIAZEM HCL 60 MG PO TABS: 30.0000 mg | ORAL_TABLET | Freq: Three times a day (TID) | ORAL | Status: DC | PRN

## 2023-05-25 MED ORDER — ONDANSETRON HCL 4 MG PO TABS: 4.0000 mg | ORAL_TABLET | Freq: Four times a day (QID) | ORAL | Status: DC | PRN

## 2023-05-25 MED ORDER — ENOXAPARIN SODIUM 40 MG/0.4ML IJ SOSY: 40.0000 mg | PREFILLED_SYRINGE | INTRAMUSCULAR | Status: DC

## 2023-05-25 MED ORDER — NITROGLYCERIN 0.4 MG SL SUBL: 0.4000 mg | SUBLINGUAL_TABLET | SUBLINGUAL | Status: DC | PRN

## 2023-05-25 MED ORDER — SODIUM CHLORIDE 0.9% FLUSH
3.0000 mL | Freq: Two times a day (BID) | INTRAVENOUS | Status: DC
  Administered 2023-05-25: 3 mL via INTRAVENOUS

## 2023-05-25 MED ORDER — CARVEDILOL 6.25 MG PO TABS
12.5000 mg | ORAL_TABLET | Freq: Two times a day (BID) | ORAL | Status: DC
Start: 2023-05-25 — End: 2023-05-25
  Administered 2023-05-25 (×2): 12.5 mg via ORAL
  Filled 2023-05-25 (×2): qty 2

## 2023-05-25 MED ORDER — SODIUM CHLORIDE 0.9 % IV SOLN: INTRAVENOUS | Status: DC

## 2023-05-25 MED ORDER — SENNOSIDES-DOCUSATE SODIUM 8.6-50 MG PO TABS: 1.0000 | ORAL_TABLET | Freq: Every evening | ORAL | Status: DC | PRN

## 2023-05-25 MED ORDER — MAGNESIUM HYDROXIDE 400 MG/5ML PO SUSP: 30.0000 mL | Freq: Every day | ORAL | Status: DC | PRN

## 2023-05-25 MED ORDER — SAW PALMETTO 450 MG PO CAPS: 3.0000 | ORAL_CAPSULE | Freq: Every day | ORAL | Status: DC

## 2023-05-25 MED ORDER — ROSUVASTATIN CALCIUM 5 MG PO TABS
5.0000 mg | ORAL_TABLET | ORAL | Status: DC
  Filled 2023-05-25: qty 1

## 2023-05-25 MED ORDER — ACETAMINOPHEN 325 MG PO TABS: 650.0000 mg | ORAL_TABLET | Freq: Four times a day (QID) | ORAL | Status: DC | PRN

## 2023-05-25 MED ORDER — APIXABAN 5 MG PO TABS: 5.0000 mg | ORAL_TABLET | Freq: Two times a day (BID) | ORAL | Status: DC

## 2023-05-25 MED ORDER — TRAZODONE HCL 50 MG PO TABS: 25.0000 mg | ORAL_TABLET | Freq: Every evening | ORAL | Status: DC | PRN

## 2023-05-25 MED ORDER — ROSUVASTATIN CALCIUM 5 MG PO TABS
5.0000 mg | ORAL_TABLET | ORAL | Status: DC
  Administered 2023-05-26 – 2023-05-30 (×3): 5 mg via ORAL
  Filled 2023-05-25 (×3): qty 1

## 2023-05-25 MED ORDER — AMLODIPINE BESYLATE 2.5 MG PO TABS
2.5000 mg | ORAL_TABLET | Freq: Every day | ORAL | Status: DC
  Administered 2023-05-26 – 2023-05-31 (×6): 2.5 mg via ORAL
  Filled 2023-05-25 (×6): qty 1

## 2023-05-25 MED ORDER — ACETAMINOPHEN 325 MG RE SUPP: 650.0000 mg | Freq: Four times a day (QID) | RECTAL | Status: DC | PRN

## 2023-05-25 MED ORDER — ONDANSETRON HCL 4 MG/2ML IJ SOLN: 4.0000 mg | Freq: Four times a day (QID) | INTRAMUSCULAR | Status: DC | PRN

## 2023-05-25 MED ORDER — CARVEDILOL 12.5 MG PO TABS: 12.5000 mg | ORAL_TABLET | Freq: Two times a day (BID) | ORAL | Status: DC

## 2023-05-25 MED ORDER — APIXABAN 5 MG PO TABS
5.0000 mg | ORAL_TABLET | Freq: Two times a day (BID) | ORAL | Status: DC
  Administered 2023-05-25: 5 mg via ORAL
  Filled 2023-05-25: qty 1

## 2023-05-25 MED ORDER — AMLODIPINE BESYLATE 5 MG PO TABS
2.5000 mg | ORAL_TABLET | Freq: Every day | ORAL | Status: DC
Start: 2023-05-25 — End: 2023-05-25
  Administered 2023-05-25: 2.5 mg via ORAL
  Filled 2023-05-25: qty 1

## 2023-05-25 MED ORDER — HEPARIN (PORCINE) 25000 UT/250ML-% IV SOLN
1100.0000 [IU]/h | INTRAVENOUS | Status: DC
  Administered 2023-05-25: 1100 [IU]/h via INTRAVENOUS
  Filled 2023-05-25: qty 250

## 2023-05-25 MED ORDER — ACETAMINOPHEN 650 MG RE SUPP: 650.0000 mg | Freq: Four times a day (QID) | RECTAL | Status: DC | PRN
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
## 2023-05-25 NOTE — Progress Notes (Signed)
*  PRELIMINARY RESULTS* Echocardiogram 2D Echocardiogram has been performed.  Carolyne Fiscal 05/25/2023, 1:55 PM

## 2023-05-25 NOTE — Assessment & Plan Note (Signed)
-   We will continue anti-hypertensive therapy. 
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
## 2023-05-25 NOTE — Assessment & Plan Note (Signed)
-   We will continue Eliquis and Coreg.

## 2023-05-25 NOTE — Assessment & Plan Note (Signed)
-   We will continue saw palmetto

## 2023-05-25 NOTE — Assessment & Plan Note (Addendum)
-   The patient will be admitted to an observation medically monitored bed. - Will check orthostatics q 12 hours. - The patient will be gently hydrated with IV normal saline and monitored for arrhythmias. -Differential diagnoses would include neurally mediated syncope, cardiogenic, arrhythmias related,  orthostatic hypotension and less likely hypoglycemia. - Cardiology consult to be obtained. - I notified [facility] group about the patient.

## 2023-05-25 NOTE — Assessment & Plan Note (Signed)
-   We will continue statin therapy. 

## 2023-05-25 NOTE — ED Notes (Signed)
Pt has been accepted to [facility] 3E room 11. This RN has attempted to call report x 1 - was told to call back in 15 minutes.

## 2023-05-25 NOTE — Progress Notes (Signed)
Still pending transfer to [facility] from [facility].  I have put patient on board for potential catheterization tomorrow.  Overnight fellow will see briefly just to make sure everything is okay once arrived.  I have also made him n.p.o. for anticipation of cardiac catheterization.  No orders have been placed though.

## 2023-05-25 NOTE — ED Notes (Signed)
[facility] at bedside 

## 2023-05-25 NOTE — ED Provider Notes (Signed)
Patient alert oriented sitting comfortably on stretcher.  No acute distress stable for transfer with [facility] personnel.  Evaluated approximately 8 PM   Sharyn Creamer, MD 05/25/23 2029
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
## 2023-05-25 NOTE — ED Notes (Signed)
Pt transported to [facility] via [facility]. Report give to transport crew as well. Pt stable.

## 2023-05-25 NOTE — Discharge Summary (Signed)
Physician Discharge Summary   Patient: Thomas [name] MRN: [id number] DOB: 12/24/1945  Admit date:     05/24/2023  Discharge date: 05/25/23  Discharge Physician: Debarah Crape   PCP: Smitty Cords, DO   Recommendations at discharge:    Transferring to [facility] for EP evaluation  Discharge Diagnoses: Principal Problem:   Syncope Active Problems:   Essential hypertension   Paroxysmal atrial fibrillation (HCC)   Dyslipidemia   BPH (benign prostatic hyperplasia)   AV heart block   Pacemaker   Elevated troponin   VT (ventricular tachycardia) (HCC)  Resolved Problems:   * No resolved hospital problems. 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  In May 2024 he had syncopal episode prompted hospitalization.  During that admission he was found to have 2-1 AV block and bradycardia.  He underwent successful placement of Abbott dual-chamber permanent pacemaker.  He has been following in cardiology clinic and doing well.  On 1/22 patient was at home when he stood up, felt dizzy, and collapsed.  His wife, who was present for the episode, reports the patient also vomited and may have lost control of his bladder.  By time EMS arrived patient had come to and was alert and oriented though bradycardic in the 30s.  And arrival to the ED patient was hemodynamically stable with normal vital.  He did have a mild AKI.  EKG revealed normal sinus rhythm with rate of 73 with PVCs.  Patient was admitted for cardiac telemetry observation.  Radiology was consulted.  Patient underwent device interrogation today reveals that the patient sustained 6 minutes of ventricular tachycardia for syncopal episode.  Discussion was had with the electrophysiology team at [facility] and it was recommended that the patient be transferred for further ischemic workup and device  upgrade. Hand off was provided to [facility] hospitalists and cardiology teams discussed.  At the time of my evaluation patient is alert and oriented to self and situation though remains disoriented to year and time.  This is not his baseline.  At baseline he is alert and oriented x 4 and is independent of all ADLs.  Has had no further events on telemetry while admitted.  Creatnine resolved to normal on repeat labs this morning.   Consultants: Cardiology Procedures performed: n/a  Disposition:  [place]  Diet recommendation:  Cardiac diet DISCHARGE MEDICATION: Allergies as of 05/25/2023       Reactions   Penicillins Itching, Swelling   Flexeril [cyclobenzaprine] Itching, Swelling   Proscar [finasteride] Other (See Comments)   dizziness   Diazepam    Metronidazole    Other reaction(s): Abdominal bloating, Stomach cramps, Abdominal bloating, Stomach cramps        Medication List     TAKE these medications    acetaminophen 325 MG tablet Commonly known as: TYLENOL Take 1-2 tablets (325-650 mg total) by mouth every 4 (four) hours as needed for mild pain.   amLODipine 2.5 MG tablet Commonly known as: NORVASC Take 1 tablet (2.5 mg total) by mouth daily. May take a second tablet (2.5 MG ) daily as needed for pressure greater then 150.   apixaban 5 MG Tabs tablet Commonly known as: Eliquis Take 1 tablet (5 mg total) by mouth 2 (two) times daily.   carvedilol 12.5 MG tablet Commonly known as: COREG Take 1 tablet (12.5 mg total) by mouth 2 (two) times daily with  a meal.   diltiazem 30 MG tablet Commonly known as: Cardizem Take 1 tablet (30 mg total) by mouth every 8 (eight) hours as needed (for heart rate greater than 100 beats per minute).   rosuvastatin 5 MG tablet Commonly known as: Crestor Take 1 tablet (5 mg total) by mouth every other day. At bedtime.   Saw Palmetto 450 MG Caps Take 3 capsules by mouth daily.        Discharge Exam: Filed Weights   05/24/23 2124   Weight: 76.7 kg   Constitutional:  Normal appearance. Non toxic-appearing.  HENT: Head Normocephalic and atraumatic.  Mucous membranes are moist.  Eyes:  Extraocular intact. Conjunctivae normal. Pupils are equal, round, and reactive to light.  Cardiovascular: Rate and Rhythm: Normal rate and regular rhythm.  Pulmonary: Non labored, symmetric rise of chest wall.  Musculoskeletal:  Normal range of motion.  Skin: warm and dry. not jaundiced.  Neurological: No focal deficit present. alert. Oriented to self and situation, not time and date Psychiatric: Mood and Affect congruent.    Condition at discharge: stable  The results of significant diagnostics from this hospitalization (including imaging, microbiology, ancillary and laboratory) are listed below for reference.   Imaging Studies: CT HEAD WO CONTRAST ( ) Result Date: 05/24/2023 CLINICAL DATA:  Dizziness, syncopal episode EXAM: CT HEAD WITHOUT CONTRAST TECHNIQUE: Contiguous axial images were obtained from the base of the skull through the vertex without intravenous contrast. RADIATION DOSE REDUCTION: This exam was performed according to the departmental dose-optimization program which includes automated exposure control, adjustment of the mA and/or kV according to patient size and/or use of iterative reconstruction technique. COMPARISON:  None Available. FINDINGS: Brain: No acute infarct or hemorrhage. Lateral ventricles and midline structures are unremarkable. No acute extra-axial fluid collections. No mass effect. Vascular: Atherosclerosis of the internal carotid arteries. No hyperdense vessel. Skull: Normal. Negative for fracture or focal lesion. Sinuses/Orbits: Mild mucosal thickening of the ethmoid air cells and left maxillary sinus. No gas fluid levels. Other: None. IMPRESSION: 1. No acute intracranial process. Electronically Signed   By: Sharlet Salina M.D.   On: 05/24/2023 22:57   DG Chest Portable 1 View Result Date:  05/24/2023 CLINICAL DATA:  Syncope.  Dysrhythmia. EXAM: PORTABLE CHEST 1 VIEW COMPARISON:  Chest radiograph dated 10/21/2022. FINDINGS: Left lung base atelectasis/scarring. Background of emphysema. No consolidative changes. There is no pleural effusion or pneumothorax. The cardiac silhouette is within limits. Atherosclerotic calcification of the aorta. Left pectoral pacemaker device. No acute osseous pathology. IMPRESSION: 1. No active disease. 2. Emphysema. Electronically Signed   By: Elgie Collard M.D.   On: 05/24/2023 21:37    Microbiology: Results for orders placed or performed during the hospital encounter of 10/20/22  Surgical PCR screen     Status: None   Collection Time: 10/20/22  7:42 PM   Specimen: Nasal Mucosa; Nasal Swab  Result Value Ref Range Status   MRSA, PCR NEGATIVE NEGATIVE Final   Staphylococcus aureus NEGATIVE NEGATIVE Final    Comment: (NOTE) The Xpert SA Assay (FDA approved for NASAL specimens in patients 37 years of age and older), is one component of a comprehensive surveillance program. It is not intended to diagnose infection nor to guide or monitor treatment. Performed at [facility], [address], [place], [place] [address]     Labs: CBC: Recent Labs  Lab 05/24/23 2126 05/25/23 1050  WBC 6.7 7.2  NEUTROABS 4.0 5.1  HGB 14.2 12.4*  HCT 41.8 36.2*  MCV 96.8  95.5  PLT 148* 146*   Basic Metabolic Panel: Recent Labs  Lab 05/24/23 2126 05/25/23 1050  NA 135 132*  K 3.9 4.2  CL 94* 97*  CO2 25 24  GLUCOSE 116* 108*  BUN 22 19  CREATININE 1.29* 0.96  CALCIUM 8.7* 8.6*  MG 2.2 2.2  PHOS  --  3.2   Liver Function Tests: Recent Labs  Lab 05/24/23 2126 05/25/23 1050  AST 18 17  ALT 14 13  ALKPHOS 52 34*  BILITOT 0.9 1.3*  PROT 7.8 7.0  ALBUMIN 3.2* 2.9*   CBG: Recent Labs  Lab 05/24/23 2117  GLUCAP 140*    Discharge time spent: greater than 30 minutes.  Signed: Debarah Crape, DO [facility] 05/25/2023

## 2023-05-26 ENCOUNTER — Encounter (HOSPITAL_COMMUNITY)
Admission: AD | Disposition: A | Discharge status: Home / Self Care | Payer: Self-pay | Source: Other Acute Inpatient Hospital | Attending: Internal Medicine

## 2023-05-26 DIAGNOSIS — I2510 Atherosclerotic heart disease of native coronary artery without angina pectoris: Secondary | ICD-10-CM

## 2023-05-26 DIAGNOSIS — I472 Ventricular tachycardia, unspecified: Secondary | ICD-10-CM | POA: Diagnosis not present

## 2023-05-26 DIAGNOSIS — R55 Syncope and collapse: Secondary | ICD-10-CM | POA: Diagnosis not present

## 2023-05-26 HISTORY — PX: LEFT HEART CATH AND CORONARY ANGIOGRAPHY: CATH118249

## 2023-05-26 LAB — CBC WITH DIFFERENTIAL/PLATELET
Abs Immature Granulocytes: 0.04 10*3/uL (ref 0.00–0.07)
Basophils Absolute: 0 10*3/uL (ref 0.0–0.1)
Basophils Relative: 0 %
Eosinophils Absolute: 0.1 10*3/uL (ref 0.0–0.5)
Eosinophils Relative: 1 %
HCT: 37.4 % — ABNORMAL LOW (ref 39.0–52.0)
Hemoglobin: 13.2 g/dL (ref 13.0–17.0)
Immature Granulocytes: 1 %
Lymphocytes Relative: 20 %
Lymphs Abs: 1.5 10*3/uL (ref 0.7–4.0)
MCH: 33.1 pg (ref 26.0–34.0)
MCHC: 35.3 g/dL (ref 30.0–36.0)
MCV: 93.7 fL (ref 80.0–100.0)
Monocytes Absolute: 0.6 10*3/uL (ref 0.1–1.0)
Monocytes Relative: 9 %
Neutro Abs: 5 10*3/uL (ref 1.7–7.7)
Neutrophils Relative %: 69 %
Platelets: 145 10*3/uL — ABNORMAL LOW (ref 150–400)
RBC: 3.99 MIL/uL — ABNORMAL LOW (ref 4.22–5.81)
RDW: 13.5 % (ref 11.5–15.5)
WBC: 7.3 10*3/uL (ref 4.0–10.5)
nRBC: 0 % (ref 0.0–0.2)

## 2023-05-26 LAB — COMPREHENSIVE METABOLIC PANEL
ALT: 12 U/L (ref 0–44)
AST: 14 U/L — ABNORMAL LOW (ref 15–41)
Albumin: 2.7 g/dL — ABNORMAL LOW (ref 3.5–5.0)
Alkaline Phosphatase: 37 U/L — ABNORMAL LOW (ref 38–126)
Anion gap: 11 (ref 5–15)
BUN: 14 mg/dL (ref 8–23)
CO2: 24 mmol/L (ref 22–32)
Calcium: 8.7 mg/dL — ABNORMAL LOW (ref 8.9–10.3)
Chloride: 98 mmol/L (ref 98–111)
Creatinine, Ser: 1.08 mg/dL (ref 0.61–1.24)
GFR, Estimated: >60 mL/min (ref >60)
Glucose, Bld: 110 mg/dL — ABNORMAL HIGH (ref 70–99)
Potassium: 4.1 mmol/L (ref 3.5–5.1)
Sodium: 133 mmol/L — ABNORMAL LOW (ref 135–145)
Total Bilirubin: 1.5 mg/dL — ABNORMAL HIGH (ref 0.0–1.2)
Total Protein: 7.6 g/dL (ref 6.5–8.1)

## 2023-05-26 LAB — HEPARIN LEVEL (UNFRACTIONATED): Heparin Unfractionated: >1.1 [IU]/mL — ABNORMAL HIGH (ref 0.30–0.70)

## 2023-05-26 LAB — MAGNESIUM: Magnesium: 2 mg/dL (ref 1.7–2.4)

## 2023-05-26 LAB — APTT: aPTT: 100 s — ABNORMAL HIGH (ref 24–36)

## 2023-05-26 SURGERY — LEFT HEART CATH AND CORONARY ANGIOGRAPHY
Anesthesia: LOCAL

## 2023-05-26 MED ORDER — FENTANYL CITRATE (PF) 100 MCG/2ML IJ SOLN
INTRAMUSCULAR | Status: DC | PRN
  Administered 2023-05-26: 25 ug via INTRAVENOUS

## 2023-05-26 MED ORDER — SODIUM CHLORIDE 0.9 % IV SOLN: 250.0000 mL | INTRAVENOUS | Status: AC | PRN

## 2023-05-26 MED ORDER — HYDRALAZINE HCL 25 MG PO TABS
25.0000 mg | ORAL_TABLET | Freq: Three times a day (TID) | ORAL | Status: DC
  Administered 2023-05-26: 25 mg via ORAL
  Filled 2023-05-26: qty 1

## 2023-05-26 MED ORDER — LIDOCAINE HCL (PF) 1 % IJ SOLN
INTRAMUSCULAR | Status: DC | PRN
  Administered 2023-05-26: 2 mL via INTRADERMAL

## 2023-05-26 MED ORDER — HEPARIN (PORCINE) IN NACL 1000-0.9 UT/500ML-% IV SOLN
INTRAVENOUS | Status: DC | PRN
  Administered 2023-05-26 (×2): 500 mL

## 2023-05-26 MED ORDER — HEPARIN SODIUM (PORCINE) 1000 UNIT/ML IJ SOLN
INTRAMUSCULAR | Status: AC
  Filled 2023-05-26: qty 10

## 2023-05-26 MED ORDER — HEPARIN SODIUM (PORCINE) 1000 UNIT/ML IJ SOLN
INTRAMUSCULAR | Status: DC | PRN
  Administered 2023-05-26: 3500 [IU] via INTRAVENOUS

## 2023-05-26 MED ORDER — CARVEDILOL 12.5 MG PO TABS
12.5000 mg | ORAL_TABLET | Freq: Two times a day (BID) | ORAL | Status: DC
  Administered 2023-05-26 (×2): 12.5 mg via ORAL
  Filled 2023-05-26 (×2): qty 1

## 2023-05-26 MED ORDER — LIDOCAINE HCL (PF) 1 % IJ SOLN
INTRAMUSCULAR | Status: AC
  Filled 2023-05-26: qty 30

## 2023-05-26 MED ORDER — METOPROLOL TARTRATE 5 MG/5ML IV SOLN: 5.0000 mg | INTRAVENOUS | Status: DC | PRN

## 2023-05-26 MED ORDER — SODIUM CHLORIDE 0.9 % WEIGHT BASED INFUSION: 3.0000 mL/kg/h | INTRAVENOUS | Status: DC

## 2023-05-26 MED ORDER — MIDAZOLAM HCL 2 MG/2ML IJ SOLN
INTRAMUSCULAR | Status: AC
Start: 2023-05-26 — End: ?
  Filled 2023-05-26: qty 2

## 2023-05-26 MED ORDER — FENTANYL CITRATE (PF) 100 MCG/2ML IJ SOLN
INTRAMUSCULAR | Status: AC
  Filled 2023-05-26: qty 2

## 2023-05-26 MED ORDER — HEPARIN (PORCINE) 25000 UT/250ML-% IV SOLN: 1100.0000 [IU]/h | INTRAVENOUS | Status: DC

## 2023-05-26 MED ORDER — HEPARIN (PORCINE) 25000 UT/250ML-% IV SOLN
1150.0000 [IU]/h | INTRAVENOUS | Status: AC
  Administered 2023-05-26: 1100 [IU]/h via INTRAVENOUS
  Administered 2023-05-27 – 2023-05-30 (×5): 1150 [IU]/h via INTRAVENOUS
  Filled 2023-05-26 (×6): qty 250

## 2023-05-26 MED ORDER — AMIODARONE HCL IN DEXTROSE 360-4.14 MG/200ML-% IV SOLN
60.0000 mg/h | INTRAVENOUS | Status: AC
  Administered 2023-05-27: 60 mg/h via INTRAVENOUS
  Filled 2023-05-26 (×2): qty 200

## 2023-05-26 MED ORDER — IOHEXOL 350 MG/ML SOLN
INTRAVENOUS | Status: DC | PRN
  Administered 2023-05-26: 32 mL

## 2023-05-26 MED ORDER — MIDAZOLAM HCL 2 MG/2ML IJ SOLN
INTRAMUSCULAR | Status: DC | PRN
  Administered 2023-05-26: 2 mg via INTRAVENOUS

## 2023-05-26 MED ORDER — SODIUM CHLORIDE 0.9 % WEIGHT BASED INFUSION: 1.0000 mL/kg/h | INTRAVENOUS | Status: DC

## 2023-05-26 MED ORDER — POTASSIUM CHLORIDE IN NACL 20-0.9 MEQ/L-% IV SOLN
INTRAVENOUS | Status: AC
Start: 2023-05-26 — End: 2023-05-27
  Filled 2023-05-26 (×4): qty 1000

## 2023-05-26 MED ORDER — ASPIRIN 81 MG PO CHEW
81.0000 mg | CHEWABLE_TABLET | ORAL | Status: AC
  Administered 2023-05-26: 81 mg via ORAL
  Filled 2023-05-26: qty 1

## 2023-05-26 MED ORDER — AMIODARONE LOAD VIA INFUSION
150.0000 mg | Freq: Once | INTRAVENOUS | Status: AC
  Administered 2023-05-26: 150 mg via INTRAVENOUS
  Filled 2023-05-26 (×2): qty 83.34

## 2023-05-26 MED ORDER — VERAPAMIL HCL 2.5 MG/ML IV SOLN
INTRAVENOUS | Status: AC
  Filled 2023-05-26: qty 2

## 2023-05-26 MED ORDER — AMIODARONE HCL IN DEXTROSE 360-4.14 MG/200ML-% IV SOLN
30.0000 mg/h | INTRAVENOUS | Status: AC
  Administered 2023-05-27 – 2023-05-29 (×6): 30 mg/h via INTRAVENOUS
  Filled 2023-05-26 (×6): qty 200

## 2023-05-26 MED ORDER — VERAPAMIL HCL 2.5 MG/ML IV SOLN
INTRAVENOUS | Status: DC | PRN
  Administered 2023-05-26: 10 mL via INTRA_ARTERIAL

## 2023-05-26 MED ORDER — SODIUM CHLORIDE 0.9% FLUSH: 3.0000 mL | INTRAVENOUS | Status: DC | PRN

## 2023-05-26 MED ORDER — METOPROLOL SUCCINATE ER 50 MG PO TB24
50.0000 mg | ORAL_TABLET | Freq: Every day | ORAL | Status: DC
  Administered 2023-05-26 – 2023-05-31 (×6): 50 mg via ORAL
  Filled 2023-05-26 (×6): qty 1

## 2023-05-26 SURGICAL SUPPLY — 8 items
CATH INFINITI AMBI 5FR JK (CATHETERS) IMPLANT
DEVICE RAD COMP TR BAND LRG (VASCULAR PRODUCTS) IMPLANT
GLIDESHEATH SLEND A-KIT 6F 22G (SHEATH) IMPLANT
GUIDEWIRE INQWIRE 1.5J.035X260 (WIRE) IMPLANT
INQWIRE 1.5J .035X260CM (WIRE) ×1
KIT SINGLE USE MANIFOLD (KITS) IMPLANT
PACK CARDIAC CATHETERIZATION (CUSTOM PROCEDURE TRAY) ×1 IMPLANT
SET ATX-X65L (MISCELLANEOUS) IMPLANT

## 2023-05-26 NOTE — H&P (View-Only) (Signed)
Patient Name: Thomas [name] Date of Encounter: 05/26/2023 [facility] Cardiologist: Julien Nordmann, MD   Interval Summary  .    No issues overnight, planned for cardiac cath today.   Vital Signs .    Vitals:   05/25/23 2113 05/26/23 0038 05/26/23 0405 05/26/23 0709  BP: (!) 145/71 (!) 161/87 (!) 150/84 (!) 149/68  Pulse: 65 76 67 (!) 55  Resp: 17 18 18 20   Temp: 98.5 F (36.9 C) 98 F (36.7 C) 97.6 F (36.4 C) 98.5 F (36.9 C)  TempSrc: Oral Oral Oral Oral  SpO2: 94% 93% 94% 94%  Weight: 75.9 kg  74.8 kg   Height: 5\' 9"  (1.753 m)       Intake/Output Summary (Last 24 hours) at 05/26/2023 0859 Last data filed at 05/26/2023 0300 Gross per 24 hour  Intake --  Output 575 ml  Net -575 ml      05/26/2023    4:05 AM 05/25/2023    9:13 PM 05/24/2023    9:24 PM  Last 3 Weights  Weight (lbs) 165 lb 167 lb 5.3 oz 169 lb  Weight (kg) 74.844 kg 75.9 kg 76.658 kg      Telemetry/ECG    Sinus Bradycardia, intermittent a-pacing - Personally Reviewed  Physical Exam .   GEN: No acute distress.   Neck: No JVD Cardiac: RRR, no murmurs, rubs, or gallops.  Respiratory: Clear to auscultation bilaterally. GI: Soft, nontender, non-distended  MS: No edema  Assessment & Plan .     78 y.o. 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   Syncope with collapse -- Patient presented after standing and feeling dizzy and had LOC with collapse without injury witnessed by his wife. He was not reported orthostatic while at [facility] -- Device interrogation completed revealing ventricular tachycardia around the time patient had syncopal episode -- transferred to [facility] for ischemic work up with plans for cardiac cath today -- echo showed LVEF of 55-60%, g1DD, mild MR, mildly  elevated pulmonary artery pressure    Ventricular tachycardia -- 6 minutes of ventricular tachycardia at 214 bpm found on device interrogation -- Some underlying paroxysmal atrial fibrillation noted prior to VT episode -- resume carvedilol 12.5 mg twice daily -- No recurrence of VT since arrival so no antiarrhythmics have been started at this point -- EP to see   Paroxysmal atrial fibrillation -- Has been maintaining sinus bradycardia to sinus rhythm while inpatient  -- Apixaban 5 mg twice PTA, on IV heparin with plans for cath. -- Last dose of apixaban was given 05/25/2023 at 0923 -- resume carvedilol 12.5 mg twice daily   Elevated high-sensitivity troponin --hsTn 16, 39, 48, 39, likely from fall or elevated blood pressures and or fall -- no chest pain, echo with normal LVEF function   Essential hypertension -- BPs elevated this morning, will resume coreg 12.5mg  BID. Stop hydralazine  -- continue amlodipine    Dyslipidemia -- Continued on statin therapy   CHB s/p PPM -- Abbott pacemaker placed for high-grade AV block (10/2022) -- as above, EP to see   For questions or updates, please contact  [facility] Please consult [email or web address] for contact info under        Signed, Laverda Page, NP   Agree with note by Laverda Page NP-C  Patient transferred from [facility]  to [facility] for left heart cath.  He has a history of complete heart block status post permanent transvenous pacemaker insertion 6/24.  Other problems include treated essential hypertension and dyslipidemia.  Does have PAF on Eliquis which has been held.  He is on IV heparin.  He had syncope and pacemaker interrogation did showed nonsustained VT tach.  He denies chest pain.  The concern was his V. tach may be ischemically mediated which was the reason for transfer and heart cath.   I have reviewed the risks, indications, and alternatives to cardiac catheterization, possible angioplasty, and stenting with  the patient. Risks include but are not limited to bleeding, infection, vascular injury, stroke, myocardial infection, arrhythmia, kidney injury, radiation-related injury in the case of prolonged fluoroscopy use, emergency cardiac surgery, and death. The patient understands the risks of serious complication is 1-2 in 1000 with diagnostic cardiac cath and 1-2% or less with angioplasty/stenting.    Runell Gess, M.D., FACP, [facility], [facility] [address] [place], [place]  [address]  [phone] 05/26/2023 9:51 AM
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
## 2023-05-26 NOTE — Progress Notes (Signed)
  Progress Note   Patient: Thomas [name] NWG:[id number] DOB: February 15, 1946 DOA: 05/25/2023     1 DOS: the patient was seen and examined on 05/26/2023    Assessment and Plan: Sustained v-tach w/ syncope  - IV NS w/ 20K+ @ 50 cc/hr  - Cardiology planning for cardiac cath today 05/26/2023  Afib chronic  - IV heparin drip   Dyslipidemia  - Crestor 5 mg PO q48hr   HTN - Norvasc 2.5 mg PO daily  - Coreg 12.5 mg PO bid       Subjective: Pt seen and examined at the bedside. Pt NPO today for cardiac cath. He remains on IV heparin drip and IV fluids.  Further recommendations once cardiac cath completed.  Physical Exam: Vitals:   05/26/23 0038 05/26/23 0405 05/26/23 0709 05/26/23 1047  BP: (!) 161/87 (!) 150/84 (!) 149/68 126/73  Pulse: 76 67 (!) 55 65  Resp: 18 18 20 20   Temp: 98 F (36.7 C) 97.6 F (36.4 C) 98.5 F (36.9 C) 98.1 F (36.7 C)  TempSrc: Oral Oral Oral Oral  SpO2: 93% 94% 94% 95%  Weight:  74.8 kg    Height:       Physical Exam HENT:     Head: Normocephalic and atraumatic.     Mouth/Throat:     Mouth: Mucous membranes are moist.  Cardiovascular:     Rate and Rhythm: Normal rate and regular rhythm.  Pulmonary:     Effort: Pulmonary effort is normal.  Abdominal:     Palpations: Abdomen is soft.  Musculoskeletal:        General: Normal range of motion.     Cervical back: Neck supple.  Skin:    General: Skin is warm.  Neurological:     Mental Status: He is alert. Mental status is at baseline.  Psychiatric:        Mood and Affect: Mood normal.       Disposition: Status is: Inpatient Remains inpatient appropriate because: He requires intensive cardiac evaluation including cardiac cath  Planned Discharge Destination: Home    Time spent: 35 minutes  Author: Baron Hamper , MD 05/26/2023 11:38 AM  For on call review [email or web address].

## 2023-05-26 NOTE — Progress Notes (Addendum)
Patient Name: Thomas [name] Date of Encounter: 05/26/2023 [facility] Cardiologist: Julien Nordmann, MD   Interval Summary  .    No issues overnight, planned for cardiac cath today.   Vital Signs .    Vitals:   05/25/23 2113 05/26/23 0038 05/26/23 0405 05/26/23 0709  BP: (!) 145/71 (!) 161/87 (!) 150/84 (!) 149/68  Pulse: 65 76 67 (!) 55  Resp: 17 18 18 20   Temp: 98.5 F (36.9 C) 98 F (36.7 C) 97.6 F (36.4 C) 98.5 F (36.9 C)  TempSrc: Oral Oral Oral Oral  SpO2: 94% 93% 94% 94%  Weight: 75.9 kg  74.8 kg   Height: 5\' 9"  (1.753 m)       Intake/Output Summary (Last 24 hours) at 05/26/2023 0859 Last data filed at 05/26/2023 0300 Gross per 24 hour  Intake --  Output 575 ml  Net -575 ml      05/26/2023    4:05 AM 05/25/2023    9:13 PM 05/24/2023    9:24 PM  Last 3 Weights  Weight (lbs) 165 lb 167 lb 5.3 oz 169 lb  Weight (kg) 74.844 kg 75.9 kg 76.658 kg      Telemetry/ECG    Sinus Bradycardia, intermittent a-pacing - Personally Reviewed  Physical Exam .   GEN: No acute distress.   Neck: No JVD Cardiac: RRR, no murmurs, rubs, or gallops.  Respiratory: Clear to auscultation bilaterally. GI: Soft, nontender, non-distended  MS: No edema  Assessment & Plan .     78 y.o. 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   Syncope with collapse -- Patient presented after standing and feeling dizzy and had LOC with collapse without injury witnessed by his wife. He was not reported orthostatic while at [facility] -- Device interrogation completed revealing ventricular tachycardia around the time patient had syncopal episode -- transferred to [facility] for ischemic work up with plans for cardiac cath today -- echo showed LVEF of 55-60%, g1DD, mild MR, mildly  elevated pulmonary artery pressure    Ventricular tachycardia -- 6 minutes of ventricular tachycardia at 214 bpm found on device interrogation -- Some underlying paroxysmal atrial fibrillation noted prior to VT episode -- resume carvedilol 12.5 mg twice daily -- No recurrence of VT since arrival so no antiarrhythmics have been started at this point -- EP to see   Paroxysmal atrial fibrillation -- Has been maintaining sinus bradycardia to sinus rhythm while inpatient  -- Apixaban 5 mg twice PTA, on IV heparin with plans for cath. -- Last dose of apixaban was given 05/25/2023 at 0923 -- resume carvedilol 12.5 mg twice daily   Elevated high-sensitivity troponin --hsTn 16, 39, 48, 39, likely from fall or elevated blood pressures and or fall -- no chest pain, echo with normal LVEF function   Essential hypertension -- BPs elevated this morning, will resume coreg 12.5mg  BID. Stop hydralazine  -- continue amlodipine    Dyslipidemia -- Continued on statin therapy   CHB s/p PPM -- Abbott pacemaker placed for high-grade AV block (10/2022) -- as above, EP to see   For questions or updates, please contact  [facility] Please consult [email or web address] for contact info under        Signed, Laverda Page, NP   Agree with note by Laverda Page NP-C  Patient transferred from [facility]  to [facility] for left heart cath.  He has a history of complete heart block status post permanent transvenous pacemaker insertion 6/24.  Other problems include treated essential hypertension and dyslipidemia.  Does have PAF on Eliquis which has been held.  He is on IV heparin.  He had syncope and pacemaker interrogation did showed nonsustained VT tach.  He denies chest pain.  The concern was his V. tach may be ischemically mediated which was the reason for transfer and heart cath.   I have reviewed the risks, indications, and alternatives to cardiac catheterization, possible angioplasty, and stenting with  the patient. Risks include but are not limited to bleeding, infection, vascular injury, stroke, myocardial infection, arrhythmia, kidney injury, radiation-related injury in the case of prolonged fluoroscopy use, emergency cardiac surgery, and death. The patient understands the risks of serious complication is 1-2 in 1000 with diagnostic cardiac cath and 1-2% or less with angioplasty/stenting.    Runell Gess, M.D., FACP, [facility], [facility] [address] [place], [place]  [address]  [phone] 05/26/2023 9:51 AM

## 2023-05-26 NOTE — Plan of Care (Signed)
Problem: Education: Goal: Knowledge of General Education information will improve Description: Including pain rating scale, medication(s)/side effects and non-pharmacologic comfort measures Outcome: Progressing   Problem: Clinical Measurements: Goal: Ability to maintain clinical measurements within normal limits will improve Outcome: Progressing   Problem: Clinical Measurements: Goal: Cardiovascular complication will be avoided Outcome: Progressing

## 2023-05-26 NOTE — TOC Initial Note (Signed)
Transition of Care [facility]) - Initial/Assessment Note    Patient Details  Name: Thomas [name] MRN: [id number] Date of Birth: 1945-10-17  Transition of Care [facility]) CM/SW Contact:    Leone Haven, RN Phone Number: 05/26/2023, 3:40 PM  Clinical Narrative:                 From home with spouse, has PCP and insurance on file, states has no HH services in place at this time or DME at home.  States wife will transport him home at [employer] and family is support system, states gets medications from [facility] on [address] in [place].  Pta self ambulatory.   Expected Discharge Plan: Home/Self Care Barriers to Discharge: Continued Medical Work up   Patient Goals and CMS Choice Patient states their goals for this hospitalization and ongoing recovery are:: return home   Choice offered to / list presented to : NA      Expected Discharge Plan and Services In-house Referral: NA Discharge Planning Services: CM Consult Post Acute Care Choice: NA Living arrangements for the past 2 months: Single Family Home                 DME Arranged: N/A DME Agency: NA       HH Arranged: NA          Prior Living Arrangements/Services Living arrangements for the past 2 months: Single Family Home Lives with:: Spouse Patient language and need for interpreter reviewed:: Yes Do you feel safe going back to the place where you live?: Yes      Need for Family Participation in Patient Care: Yes (Comment) Care giver support system in place?: Yes (comment)   Criminal Activity/Legal Involvement Pertinent to Current Situation/Hospitalization: No - Comment as needed  Activities of Daily Living   ADL Screening (condition at time of admission) Independently performs ADLs?: Yes (appropriate for developmental age) Is the patient deaf or have difficulty hearing?: No Does the patient have difficulty seeing, even when wearing glasses/contacts?: No Does the patient have difficulty concentrating,  remembering, or making decisions?: No  Permission Sought/Granted Permission sought to share information with : Case Manager Permission granted to share information with : Yes, Verbal Permission Granted              Emotional Assessment Appearance:: Appears stated age Attitude/Demeanor/Rapport: Engaged Affect (typically observed): Appropriate Orientation: : Oriented to Self, Oriented to Place, Oriented to  Time, Oriented to Situation Alcohol / Substance Use: Not Applicable Psych Involvement: No (comment)  Admission diagnosis:  Syncope and collapse [R55] Patient Active Problem List   Diagnosis Date Noted   Dyslipidemia 05/25/2023   Pacemaker 05/25/2023   Elevated troponin 05/25/2023   VT (ventricular tachycardia) (HCC) 05/25/2023   Syncope 10/20/2022   Bradycardia 10/19/2022   Syncope and collapse 10/19/2022   Second degree heart block 10/18/2022   Troponin I above reference range 10/18/2022   Atrial fibrillation, chronic (HCC) 10/18/2022   AV heart block 10/18/2022   Allergic conjunctivitis and rhinitis 06/09/2017   Paroxysmal atrial fibrillation (HCC) 02/12/2017   Colon cancer screening 11/30/2016   Dysphagia 11/25/2015   Elevated hemoglobin A1c 11/25/2015   Arrhythmia 09/23/2015   Medication monitoring encounter 01/20/2015   Anxiety 11/20/2014   Essential hypertension    Hyperlipidemia    Arthritis    Insomnia    BPH (benign prostatic hyperplasia)    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 ([place]) [place] [address] Phone: [phone] Fax: [phone]     Social Drivers of Health (SDOH) Social History: SDOH Screenings   Food Insecurity: No Food Insecurity (05/25/2023)  Housing: Low Risk  (05/25/2023)  Transportation Needs: No Transportation Needs (05/25/2023)  Utilities: Not At Risk (05/25/2023)  Alcohol  Screen: Low Risk  (02/16/2023)  Depression (PHQ2-9): Low Risk  (04/03/2023)  Financial Resource Strain: Low Risk  (02/16/2023)  Physical Activity: Sufficiently Active (02/16/2023)  Social Connections: Moderately Integrated (05/25/2023)  Stress: No Stress Concern Present (02/16/2023)  Tobacco Use: Medium Risk (05/25/2023)  Health Literacy: Adequate Health Literacy (02/16/2023)   SDOH Interventions:     Readmission Risk Interventions     No data to display

## 2023-05-26 NOTE — Interval H&P Note (Signed)
History and Physical Interval Note:  05/26/2023 4:56 PM  Thomas [name]  has presented today for surgery, with the diagnosis of unstable angina.  The various methods of treatment have been discussed with the patient and family. After consideration of risks, benefits and other options for treatment, the patient has consented to  Procedure(s): LEFT HEART CATH AND CORONARY ANGIOGRAPHY (N/A) and possible coronary angioplasty as a surgical intervention.  The patient's history has been reviewed, patient examined, no change in status, stable for surgery.  I have reviewed the patient's chart and labs.  Questions were answered to the patient's satisfaction.    Cath Lab Visit (complete for each Cath Lab visit)  Clinical Evaluation Leading to the Procedure:   ACS: Yes.    Non-ACS:    Anginal Classification: CCS IV  Anti-ischemic medical therapy: Minimal Therapy (1 class of medications)  Non-Invasive Test Results: No non-invasive testing performed  Prior CABG: No previous CABG   Yates Decamp
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
## 2023-05-27 DIAGNOSIS — R55 Syncope and collapse: Secondary | ICD-10-CM | POA: Diagnosis not present

## 2023-05-27 DIAGNOSIS — I472 Ventricular tachycardia, unspecified: Secondary | ICD-10-CM | POA: Diagnosis not present

## 2023-05-27 LAB — BASIC METABOLIC PANEL
Anion gap: 11 (ref 5–15)
BUN: 16 mg/dL (ref 8–23)
CO2: 23 mmol/L (ref 22–32)
Calcium: 8.7 mg/dL — ABNORMAL LOW (ref 8.9–10.3)
Chloride: 97 mmol/L — ABNORMAL LOW (ref 98–111)
Creatinine, Ser: 1.13 mg/dL (ref 0.61–1.24)
GFR, Estimated: >60 mL/min (ref >60)
Glucose, Bld: 179 mg/dL — ABNORMAL HIGH (ref 70–99)
Potassium: 3.8 mmol/L (ref 3.5–5.1)
Sodium: 131 mmol/L — ABNORMAL LOW (ref 135–145)

## 2023-05-27 LAB — APTT
aPTT: 83 s — ABNORMAL HIGH (ref 24–36)
aPTT: 64 s — ABNORMAL HIGH (ref 24–36)
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
## 2023-05-27 NOTE — Progress Notes (Signed)
Electrophysiology Progress Note  Patient Name: Thomas [name] Date of Encounter: 05/27/2023  Primary Cardiologist: Julien Nordmann, MD  Electrophysiologist: Dr. Graciela Husbands  Subjective   No complaints at present - no chest pain, light headedness  Inpatient Medications    Scheduled Meds:  amLODipine  2.5 mg Oral Daily   metoprolol succinate  50 mg Oral Daily   rosuvastatin  5 mg Oral Q48H   Continuous Infusions:  sodium chloride     amiodarone 30 mg/hr (05/27/23 0919)   heparin 1,100 Units/hr (05/26/23 2105)   PRN Meds: sodium chloride, acetaminophen **OR** acetaminophen, metoprolol tartrate, nitroGLYCERIN, ondansetron **OR** ondansetron (ZOFRAN) IV, senna-docusate, sodium chloride flush   Vital Signs    Vitals:   05/27/23 0010 05/27/23 0337 05/27/23 0715 05/27/23 1115  BP: (!) 122/90 (!) 145/91 (!) 160/68 (!) 154/87  Pulse: (!) 59 60 (!) 59 60  Resp: 20 18 17 19   Temp: 97.9 F (36.6 C) (!) 97.3 F (36.3 C) 98 F (36.7 C) 98.3 F (36.8 C)  TempSrc: Oral Oral Oral Oral  SpO2: 94% 95% 94% 94%  Weight:  75.9 kg    Height:        Intake/Output Summary (Last 24 hours) at 05/27/2023 1253 Last data filed at 05/27/2023 1100 Gross per 24 hour  Intake 270 ml  Output 590 ml  Net -320 ml   Filed Weights   05/25/23 2113 05/26/23 0405 05/27/23 0337  Weight: 75.9 kg 74.8 kg 75.9 kg    Telemetry    A-paced; frequent PVCs - Personally Reviewed  ECG    A-paced rhythm with frequent PVCs - Personally Reviewed  Physical Exam   GEN: No acute distress.   Neck: No JVD Cardiac: RRR, no murmurs, rubs, or gallops.  Respiratory: Clear to auscultation bilaterally. GI: Soft, nontender, non-distended  MS: No edema; No deformity. Neuro:  Nonfocal  Psych: Normal affect   Labs    Chemistry Recent Labs  Lab 05/24/23 2126 05/25/23 1050 05/26/23 0245 05/27/23 0835  NA 135 132* 133* 131*  K 3.9 4.2 4.1 3.8  CL 94* 97* 98 97*  CO2 25 24 24 23   GLUCOSE 116* 108* 110* 179*   BUN 22 19 14 16   CREATININE 1.29* 0.96 1.08 1.13  CALCIUM 8.7* 8.6* 8.7* 8.7*  PROT 7.8 7.0 7.6  --   ALBUMIN 3.2* 2.9* 2.7*  --   AST 18 17 14*  --   ALT 14 13 12   --   ALKPHOS 52 34* 37*  --   BILITOT 0.9 1.3* 1.5*  --   GFRNONAA 56* >60 >60 >60  ANIONGAP 16* 11 11 11      Hematology Recent Labs  Lab 05/24/23 2126 05/25/23 1050 05/26/23 0245  WBC 6.7 7.2 7.3  RBC 4.32 3.79* 3.99*  HGB 14.2 12.4* 13.2  HCT 41.8 36.2* 37.4*  MCV 96.8 95.5 93.7  MCH 32.9 32.7 33.1  MCHC 34.0 34.3 35.3  RDW 13.7 13.6 13.5  PLT 148* 146* 145*    Cardiac EnzymesNo results for input(s): "TROPONINI" in the last 168 hours. No results for input(s): "TROPIPOC" in the last 168 hours.   BNP Recent Labs  Lab 05/24/23 2126  BNP 156.3*     DDimer No results for input(s): "DDIMER" in the last 168 hours.   Summary of Pertinent studies    TTE: 05/25/23 -- EF 55-60%, Gr I diastolic dysfunction  Cardiac cath:   2nd Mrg lesion is 40% stenosed.   RPDA lesion is 20% stenosed.  Imaging:  reviewed  Labs: reviewed  Patient Profile     78 y.o. 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   Assessment & Plan    VT Presenting with syncope Normal EF, non-significant disease on LHC Qualifies for secondary prevention ICD Advance imaging would be beneficial -- MRI will need to wait until after his current, mixed system is exchanged for a single vendor ICD system.   History of symptomatic bradycardia - 2:1 AV block Abbott dual chamber pacemaker Medtronic 3830 RV lead, abbott 1231 RA lead System implanted 10/21/2022 44% V-paced Will plan for removal of the RV paced-sensed lead at time of ICD upgrade   Frequent pleiomorphic PVCs Continue amiodarone  History of paroxysmal  atrial fibrillation Maintaining sinus rhythm Apixaban on hold pending device upgrade   For questions or updates, please contact [facility] Please consult [email or web address] for contact info under Cardiology/STEMI.      Signed, Maurice Small, MD 05/27/2023, 12:53 PM

## 2023-05-27 NOTE — Progress Notes (Signed)
Progress Note   Patient: Thomas [name]:[id number] DOB: [birth year] DOA: 05/25/2023     2 DOS: the patient was seen and examined on 05/27/2023   Brief hospital course: 78 year old male transferred from [facility], admission date 1/22.  He has a past medical history of HTN, HLD paroxysmal atrial flutter, high-grade AV block sp PPM.  On 1/22 patient had a syncopal episode after standing.  His prodromal dizziness.  911 was called, patient found to be bradycardic with heart rate in the 30s.  Patient's pacemaker was interrogated 6 minutes sustained V. tach noted.  Patient transferred to [facility] for EPS consult, device upgrade and possible heart catheter for ischemic workup.   Assessment and Plan:  Syncope with collapse secondary to sustained VT Cardiology on board we appreciate input Echo showed LVEF of 55-60% EP physician following  Ventricular tachycardia 6 minutes of ventricular tachycardia on device interrogation Continue carvedilol 12.5 mg twice daily EP cardiologist on board Patient currently on amiodarone drip   Paroxysmal atrial fibrillation Currently on heparin drip Continue carvedilol 12.5 mg twice daily   Elevated high-sensitivity troponin Mildly elevated troponin secondary to possibly uncontrolled hypertension Cardiology on board Patient underwent cardiac catheterization on 05/26/2023 that showed nonobstructive CAD  Essential hypertension Continue amlodipine and Coreg   Dyslipidemia Continue on statin therapy   Subjective:  Seen and examined at bedside this morning Denies ongoing chest pain Denies shortness of breath, nausea vomiting or abdominal pain  Physical Exam: General: Laying in bed in no acute distress    Mouth: Mucous membranes are moist.  Cardiovascular:     Rate and Rhythm: Normal rate and regular rhythm.  Pulmonary:     Effort: Pulmonary effort is normal.  Abdominal:     Palpations: Abdomen is soft.  Musculoskeletal:        General: Normal range of  motion.     Cervical back: Neck supple.  Skin:    General: Skin is warm.  Neurological:     Mental Status: He is alert. Mental status is at baseline.  Psychiatric:        Mood and Affect: Mood normal.    Vitals:   05/27/23 0010 05/27/23 0337 05/27/23 0715 05/27/23 1115  BP: (!) 122/90 (!) 145/91 (!) 160/68 (!) 154/87  Pulse: (!) 59 60 (!) 59 60  Resp: 20 18 17 19   Temp: 97.9 F (36.6 C) (!) 97.3 F (36.3 C) 98 F (36.7 C) 98.3 F (36.8 C)  TempSrc: Oral Oral Oral Oral  SpO2: 94% 95% 94% 94%  Weight:  75.9 kg    Height:        Data Reviewed:    Latest Ref Rng & Units 05/27/2023    8:35 AM 05/26/2023    2:45 AM 05/25/2023   10:50 AM  BMP  Glucose 70 - 99 mg/dL 272  536  644   BUN 8 - 23 mg/dL 16  14  19    Creatinine 0.61 - 1.24 mg/dL 0.34  7.42  5.95   Sodium 135 - 145 mmol/L 131  133  132   Potassium 3.5 - 5.1 mmol/L 3.8  4.1  4.2   Chloride 98 - 111 mmol/L 97  98  97   CO2 22 - 32 mmol/L 23  24  24    Calcium 8.9 - 10.3 mg/dL 8.7  8.7  8.6        Latest Ref Rng & Units 05/26/2023    2:45 AM 05/25/2023   10:50 AM 05/24/2023  9:26 PM  CBC  WBC 4.0 - 10.5 K/uL 7.3  7.2  6.7   Hemoglobin 13.0 - 17.0 g/dL 65.7  84.6  96.2   Hematocrit 39.0 - 52.0 % 37.4  36.2  41.8   Platelets 150 - 400 K/uL 145  146  148       Time spent: 56 minutes  Author: Loyce Dys, MD 05/27/2023 2:50 PM  For on call review [email or web address].

## 2023-05-27 NOTE — Progress Notes (Addendum)
Patient noted to be having a hematoma to the right radial. Patient is status post heart cath yesterday with right radial access. The access site was minus the dressing, which patient denies taking off, and has no recollection of when the dressing came off. Hematoma area marked, pressure dressing applied. Right radial pulse palpable +2. Patient is on heparin drip, pharmacist notified. Elnita Maxwell, RN  [phone]: Cardiology NP notified of the patient's hematoma via phone conversation, states to monitor and continue the heparin drip. PTT drawn by lab, pharmacist updated. Elnita Maxwell, RN

## 2023-05-27 NOTE — Plan of Care (Signed)
  Problem: Health Behavior/Discharge Planning: Goal: Ability to manage health-related needs will improve Outcome: Progressing   Problem: Clinical Measurements: Goal: Will remain free from infection Outcome: Progressing   Problem: Clinical Measurements: Goal: Diagnostic test results will improve Outcome: Progressing   Problem: Clinical Measurements: Goal: Respiratory complications will improve Outcome: Progressing   Problem: Clinical Measurements: Goal: Cardiovascular complication will be avoided Outcome: Progressing

## 2023-05-27 NOTE — Plan of Care (Signed)
  Problem: Education: Goal: Knowledge of General Education information will improve Description: Including pain rating scale, medication(s)/side effects and non-pharmacologic comfort measures Outcome: Progressing   Problem: Clinical Measurements: Goal: Ability to maintain clinical measurements within normal limits will improve Outcome: Progressing   Problem: Clinical Measurements: Goal: Will remain free from infection Outcome: Progressing   Problem: Clinical Measurements: Goal: Diagnostic test results will improve Outcome: Progressing   Problem: Activity: Goal: Risk for activity intolerance will decrease Outcome: Progressing   Problem: Nutrition: Goal: Adequate nutrition will be maintained Outcome: Progressing

## 2023-05-27 NOTE — Progress Notes (Signed)
   Patient Name: Thomas [name] Date of Encounter: 05/27/2023 [facility] Cardiologist: Julien Nordmann, MD   Interval Summary  .    Cath yesterday shows nonobstructive CAD, LVEDP 6.  BP 160/68 this morning.  Denies any chest pain or dyspnea  Vital Signs .    Vitals:   05/26/23 1935 05/27/23 0010 05/27/23 0337 05/27/23 0715  BP: 112/61 (!) 122/90 (!) 145/91 (!) 160/68  Pulse:  (!) 59 60 (!) 59  Resp: 18 20 18 17   Temp: 98 F (36.7 C) 97.9 F (36.6 C) (!) 97.3 F (36.3 C) 98 F (36.7 C)  TempSrc: Oral Oral Oral Oral  SpO2: 93% 94% 95% 94%  Weight:   75.9 kg   Height:        Intake/Output Summary (Last 24 hours) at 05/27/2023 1043 Last data filed at 05/27/2023 0700 Gross per 24 hour  Intake 270 ml  Output 490 ml  Net -220 ml      05/27/2023    3:37 AM 05/26/2023    4:05 AM 05/25/2023    9:13 PM  Last 3 Weights  Weight (lbs) 167 lb 5.3 oz 165 lb 167 lb 5.3 oz  Weight (kg) 75.9 kg 74.844 kg 75.9 kg      Telemetry/ECG    a-pacing - Personally Reviewed  Physical Exam .   GEN: No acute distress.   Neck: No JVD Cardiac: RRR, no murmurs, rubs, or gallops.  Respiratory: Clear to auscultation bilaterally. GI: Soft, nontender, non-distended  MS: No edema  Assessment & Plan .     78 y.o. 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   Syncope with collapse -- Patient presented after standing and feeling dizzy and had LOC with collapse without injury witnessed by his wife. He was not reported orthostatic while at [facility] -- Device interrogation completed revealing ventricular tachycardia around the time patient had syncopal episode -- transferred to [facility] for ischemic work up.  Cath 1/24 shows nonobstructive CAD, LVEDP 6.  -- echo showed LVEF of  55-60%, g1DD, mild MR, mildly elevated pulmonary artery pressure    Ventricular tachycardia -- 6 minutes of ventricular tachycardia at 214 bpm found on device interrogation -- Some underlying paroxysmal atrial fibrillation noted prior to VT episode -- resume carvedilol 12.5 mg twice daily -- EP consulted, started on IV amiodarone and planning device upgrade to ICD   Paroxysmal atrial fibrillation -- Has been maintaining sinus rhythm or a-pacinng while inpatient  -- Apixaban 5 mg twice PTA, on IV heparin currently given plans for device upgrade -- Last dose of apixaban was given 05/25/2023 at 0923 -- resume carvedilol 12.5 mg twice daily   Elevated high-sensitivity troponin --hsTn 16, 39, 48, 39, likely from fall or elevated blood pressures and or fall -- no chest pain, echo with normal LVEF function   Essential hypertension -- BPs elevated, resumed coreg 12.5mg  BID. Stop hydralazine  -- continue amlodipine    Dyslipidemia -- Continued on statin therapy   CHB s/p PPM -- Abbott pacemaker placed for high-grade AV block (10/2022) -- as above, EP following  For questions or updates, please contact [facility] Please consult [email or web address] for contact info under        Signed, Little Ishikawa, MD
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
## 2023-05-28 DIAGNOSIS — E785 Hyperlipidemia, unspecified: Secondary | ICD-10-CM | POA: Diagnosis not present

## 2023-05-28 DIAGNOSIS — I482 Chronic atrial fibrillation, unspecified: Secondary | ICD-10-CM | POA: Diagnosis not present

## 2023-05-28 DIAGNOSIS — I10 Essential (primary) hypertension: Secondary | ICD-10-CM | POA: Diagnosis not present

## 2023-05-28 DIAGNOSIS — R55 Syncope and collapse: Secondary | ICD-10-CM | POA: Diagnosis not present

## 2023-05-28 DIAGNOSIS — N400 Enlarged prostate without lower urinary tract symptoms: Secondary | ICD-10-CM

## 2023-05-28 LAB — BASIC METABOLIC PANEL
Anion gap: 11 (ref 5–15)
BUN: 12 mg/dL (ref 8–23)
CO2: 24 mmol/L (ref 22–32)
Calcium: 8.7 mg/dL — ABNORMAL LOW (ref 8.9–10.3)
Chloride: 97 mmol/L — ABNORMAL LOW (ref 98–111)
Creatinine, Ser: 1 mg/dL (ref 0.61–1.24)
GFR, Estimated: >60 mL/min (ref >60)
Glucose, Bld: 109 mg/dL — ABNORMAL HIGH (ref 70–99)
Potassium: 4.1 mmol/L (ref 3.5–5.1)
Sodium: 132 mmol/L — ABNORMAL LOW (ref 135–145)

## 2023-05-28 LAB — CBC
HCT: 36.5 % — ABNORMAL LOW (ref 39.0–52.0)
Hemoglobin: 12.8 g/dL — ABNORMAL LOW (ref 13.0–17.0)
MCH: 33 pg (ref 26.0–34.0)
MCHC: 35.1 g/dL (ref 30.0–36.0)
MCV: 94.1 fL (ref 80.0–100.0)
Platelets: 160 10*3/uL (ref 150–400)
RBC: 3.88 MIL/uL — ABNORMAL LOW (ref 4.22–5.81)
RDW: 13.2 % (ref 11.5–15.5)
WBC: 5 10*3/uL (ref 4.0–10.5)
nRBC: 0 % (ref 0.0–0.2)

## 2023-05-28 LAB — APTT: aPTT: 82 s — ABNORMAL HIGH (ref 24–36)

## 2023-05-28 LAB — HEPARIN LEVEL (UNFRACTIONATED): Heparin Unfractionated: 0.42 [IU]/mL (ref 0.30–0.70)

## 2023-05-28 NOTE — Progress Notes (Signed)
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 He presented with syncopal episode after standing and had prodrome of dizziness.  911 was called and he was found to be bradycardic with a heart rate in the 30s.  His pacemaker was interrogated and patient was found to have sustained V. tach noted for 6 minutes.  Subsequently was transferred to [facility] for a EP cardiology consult underwent a heart catheterization for further ischemic workup and current plan is for device upgrade  Assessment and Plan:  Syncope with collapse secondary to sustained VT -Medical cardiology and EP cardiology consulted -Currently on a Amiodarone drip will continue monitor on telemetry; Per Cariology may switch to po  tomorrow -Continue with metoprolol succinate 50 mg p.o. daily -Echo showed LVEF of 55-60% -Medical Cardiology and EP physician following and currently patient will be undergoing a removal of the RV paced sensing lead and placement of ICD lead for his Abbott dual-chamber pacemaker   Ventricular tachycardia -Had 6 minutes of ventricular tachycardia on device interrogation -Carvedilol 12.5 mg twice daily and to metoprolol succinate 50 mg p.o. daily -EP cardiologist on board -Patient currently on amiodarone drip -Neurology they feel that advanced imaging would be beneficial and recommending MRI but they are recommending to wait until after his current, mixed system was exchanged for a single vendor ICD system   Paroxysmal atrial fibrillation -Currently on heparin drip -BB as above -Continue to Monitor on Telmetry   Elevated high-sensitivity  troponin -Mildly elevated troponin secondary to possibly uncontrolled hypertension -Cardiology on board -Patient underwent cardiac catheterization on 05/26/2023 that showed nonobstructive CAD and Normal EF on ECHO   Essential Hypertension -Continue Amlodipine 2.5 mg p.o. daily and Metoprolol Succinate 50 mg p.o. daily -Patient has IV Metropol tartrate 5 mg every 4 as needed for high blood pressure for systolic blood pressure greater than 80 or heart rate greater than 120 -Continue monitor blood pressures per protocol -Last blood pressure reading was elevated to 174/76   Dyslipidemia -Continue statin therapy with Rosuvastatin 5 mg every 48 hours  Hyponatremia -Na+ Trend: Recent Labs  Lab 05/24/23 2126 05/25/23 1050 05/26/23 0245 05/27/23 0835 05/28/23 0232  NA 135 132* 133* 131* 132*  -Continue to Monitor and Trend and repeat CMP in the AM  Normocytic Anemia -Hgb/Hct Trend: Recent Labs  Lab 05/24/23 2126 05/25/23 1050 05/26/23 0245 05/28/23 0232  HGB 14.2 12.4* 13.2 12.8*  HCT 41.8 36.2* 37.4* 36.5*  MCV 96.8 95.5 93.7 94.1  -Check Anemia Panel in the AM -Continue to Monitor for S/Sx of Bleeding; No overt bleeding noted -Repeat CBC in the AM  Hypoalbuminemia -Patient's Albumin Trend: Recent Labs  Lab 05/24/23 2126 05/25/23 1050 05/26/23 0245  ALBUMIN 3.2* 2.9* 2.7*  -Continue to Monitor and Trend and repeat CMP in the AM   DVT prophylaxis: Anticoagulated with a Heparin gtt    Code Status: Full Code Family Communication: No family present at bedside  Disposition Plan:  Level of care: Telemetry Cardiac Status is: Inpatient Remains inpatient appropriate because: Further care per Cardiology    Consultants:  Medical Cardiology EP Cardiology  Procedures:  ECHOCARDIOGRAM IMPRESSIONS     1. Left ventricular ejection fraction, by estimation, is  55 to 60%. Left  ventricular ejection fraction by 2D MOD biplane is 54.1 %. The left  ventricle has normal  function. The left ventricle demonstrates regional  wall motion abnormalities (septal wall  motion consistent with conduction abnormality). Left ventricular diastolic  parameters are consistent with Grade I diastolic dysfunction (impaired  relaxation).   2. Right ventricular systolic function is normal. The right ventricular  size is normal. There is mildly elevated pulmonary artery systolic  pressure. The estimated right ventricular systolic pressure is 38.5 mmHg.   3. The mitral valve is normal in structure. Mild mitral valve  regurgitation. No evidence of mitral stenosis.   4. The aortic valve is normal in structure. Aortic valve regurgitation is  mild. No aortic stenosis is present.   5. The inferior vena cava is normal in size with greater than 50%  respiratory variability, suggesting right atrial pressure of 3 mmHg.   6. Frequent PVCs noted.   FINDINGS   Left Ventricle: Left ventricular ejection fraction, by estimation, is 55  to 60%. Left ventricular ejection fraction by 2D MOD biplane is 54.1 %.  The left ventricle has normal function. The left ventricle demonstrates  regional wall motion abnormalities.  The left ventricular internal cavity size was normal in size. There is no  left ventricular hypertrophy. Left ventricular diastolic parameters are  consistent with Grade I diastolic dysfunction (impaired relaxation).   Right Ventricle: The right ventricular size is normal. No increase in  right ventricular wall thickness. Right ventricular systolic function is  normal. There is mildly elevated pulmonary artery systolic pressure. The  tricuspid regurgitant velocity is 2.76   m/s, and with an assumed right atrial pressure of 8 mmHg, the estimated  right ventricular systolic pressure is 38.5 mmHg.   Left Atrium: Left atrial size was normal in size.   Right Atrium: Right atrial size was normal in size.   Pericardium: There is no evidence of pericardial effusion.   Mitral  Valve: The mitral valve is normal in structure. Mild mitral valve  regurgitation. No evidence of mitral valve stenosis. MV peak gradient, 3.6  mmHg. The mean mitral valve gradient is 2.0 mmHg.   Tricuspid Valve: The tricuspid valve is normal in structure. Tricuspid  valve regurgitation is mild . No evidence of tricuspid stenosis.   Aortic Valve: The aortic valve is normal in structure. Aortic valve  regurgitation is mild. No aortic stenosis is present. Aortic valve mean  gradient measures 4.0 mmHg. Aortic valve peak gradient measures 6.9 mmHg.  Aortic valve area, by VTI measures 2.57  cm.   Pulmonic Valve: The pulmonic valve was normal in structure. Pulmonic valve  regurgitation is not visualized. No evidence of pulmonic stenosis.   Aorta: The aortic root is normal in size and structure.   Venous: The inferior vena cava is normal in size with greater than 50%  respiratory variability, suggesting right atrial pressure of 3 mmHg.   IAS/Shunts: No atrial level shunt detected by color flow Doppler.     LEFT VENTRICLE  PLAX 2D                        Biplane EF (MOD)  LVIDd:         5.60 cm         LV Biplane EF:   Left  LVIDs:         3.80 cm  ventricular  LV PW:         1.00 cm                          ejection  LV IVS:        1.60 cm                          fraction by  LVOT diam:     2.00 cm                          2D MOD  LV SV:         75                               biplane is  LV SV Index:   39                               54.1 %.  LVOT Area:     3.14 cm                                 Diastology                                 LV e' medial:    4.54 cm/s  LV Volumes (MOD)               LV E/e' medial:  14.4  LV vol d, MOD    87.7 ml       LV e' lateral:   7.22 cm/s  A2C:                           LV E/e' lateral: 9.1  LV vol d, MOD    83.3 ml  A4C:  LV vol s, MOD    35.9 ml  A2C:  LV vol s, MOD    39.7 ml  A4C:  LV SV MOD A2C:   51.8 ml   LV SV MOD A4C:   83.3 ml  LV SV MOD BP:    46.8 ml   RIGHT VENTRICLE  RV Basal diam:  3.35 cm  RV Mid diam:    3.00 cm  RV S prime:     11.70 cm/s  TAPSE (M-mode): 2.6 cm   LEFT ATRIUM             Index        RIGHT ATRIUM           Index  LA diam:        4.40 cm 2.26 cm/m   RA Area:     19.70 cm  LA Vol (A2C):   63.2 ml 32.52 ml/m  RA Volume:   52.70 ml  27.12 ml/m  LA Vol (A4C):   58.5 ml 30.11 ml/m  LA Biplane Vol: 63.1 ml 32.47 ml/m   AORTIC VALVE                    PULMONIC VALVE  AV Area (Vmax):    2.42 cm     PV Vmax:  0.83 m/s  AV Area (Vmean):   2.12 cm     PV Peak grad:  2.8 mmHg  AV Area (VTI):     2.57 cm  AV Vmax:           131.00 cm/s  AV Vmean:          91.100 cm/s  AV VTI:            0.293 m  AV Peak Grad:      6.9 mmHg  AV Mean Grad:      4.0 mmHg  LVOT Vmax:         101.00 cm/s  LVOT Vmean:        61.400 cm/s  LVOT VTI:          0.240 m  LVOT/AV VTI ratio: 0.82    AORTA  Ao Root diam: 4.00 cm   MITRAL VALVE               TRICUSPID VALVE  MV Area (PHT): 2.22 cm    TR Peak grad:   30.5 mmHg  MV Area VTI:   2.63 cm    TR Vmax:        276.00 cm/s  MV Peak grad:  3.6 mmHg  MV Mean grad:  2.0 mmHg    SHUNTS  MV Vmax:       0.95 m/s    Systemic VTI:  0.24 m  MV Vmean:      58.7 cm/s   Systemic Diam: 2.00 cm  MV Decel Time: 342 msec  MV E velocity: 65.60 cm/s  MV A velocity: 88.70 cm/s  MV E/A ratio:  0.74   CARDIAC CATHETERIZATION   2nd Mrg lesion is 40% stenosed.   RPDA lesion is 20% stenosed.   Left Heart Catheterization 05/26/23: Hemodynamic data: LVEDP 6 mmHg.  There is no pressure gradient across the aortic valve.   Angiographic data: LM: Large-caliber vessel.  Smooth and normal. LAD: Very large caliber vessel.  Moderate to severe periarterial calcification noted in the proximal and mid LAD.  Gives origin to 3 small to moderate-sized diagonals.  There is mild luminal irregularity in the LAD.  LAD ends at the apex. LCx: Is a  large-caliber vessel.  Gives origin to large OM 2 and continues as a OM 3, 40% stenosis in proximal OM1 and a 30% stenosis in mid CX distal to OM1. RCA: Very large caliber vessel, there is proximal 20% stenosis and PDA has mild 20% stenosis.  Otherwise there is mild luminal irregularity.      Impression and recommendations: No significant coronary disease by cardiac catheterization.  Normal EDP.  Antimicrobials:  Anti-infectives (From admission, onward)    None       Subjective: Seen and examined at bedside and no complaints.  Was resting.  No nausea or vomiting.  Feels okay and currently awaiting a device upgrade  Objective: Vitals:   05/28/23 0443 05/28/23 0724 05/28/23 1141 05/28/23 1550  BP: (!) 160/75 (!) 165/81 (!) 157/80 (!) 174/76  Pulse: (!) 59 (!) 57 60 (!) 58  Resp: 17 18 18 18   Temp: 98.3 F (36.8 C) 98.1 F (36.7 C) 98.4 F (36.9 C) 98.7 F (37.1 C)  TempSrc: Oral Oral Oral Oral  SpO2: 95% 95% 96% 95%  Weight: 74.3 kg     Height:        Intake/Output Summary (Last 24 hours) at 05/28/2023 1747 Last data filed at 05/28/2023 1629 Gross per 24 hour  Intake 995.49 ml  Output 1695 ml  Net -699.51 ml   Filed Weights   05/26/23 0405 05/27/23 0337 05/28/23 0443  Weight: 74.8 kg 75.9 kg 74.3 kg   Examination: Physical Exam:  Constitutional: Elderly Caucasian male in NAD Respiratory: Diminished to auscultation bilaterally, no wheezing, rales, rhonchi or crackles. Normal respiratory effort and patient is not tachypenic. No accessory muscle use. Unlabored breathing   Cardiovascular: RRR, no murmurs / rubs / gallops. S1 and S2 auscultated. No appreciable Le edema Abdomen: Soft, non-tender, non-distended. Bowel sounds positive.  GU: Deferred. Musculoskeletal: No clubbing / cyanosis of digits/nails. No joint deformity upper and lower extremities. Skin: No rashes, lesions, ulcers on a limited skin evaluation. No induration; Warm and dry.  Neurologic: CN 2-12 grossly  intact with no focal deficits. Romberg sign and cerebellar reflexes not assessed.  Psychiatric: Normal judgment and insight. Alert and oriented x 3. Normal mood and appropriate affect.   Data Reviewed: I have personally reviewed following labs and imaging studies  CBC: Recent Labs  Lab 05/24/23 2126 05/25/23 1050 05/26/23 0245 05/28/23 0232  WBC 6.7 7.2 7.3 5.0  NEUTROABS 4.0 5.1 5.0  --   HGB 14.2 12.4* 13.2 12.8*  HCT 41.8 36.2* 37.4* 36.5*  MCV 96.8 95.5 93.7 94.1  PLT 148* 146* 145* 160   Basic Metabolic Panel: Recent Labs  Lab 05/24/23 2126 05/25/23 1050 05/26/23 0245 05/27/23 0835 05/28/23 0232  NA 135 132* 133* 131* 132*  K 3.9 4.2 4.1 3.8 4.1  CL 94* 97* 98 97* 97*  CO2 25 24 24 23 24   GLUCOSE 116* 108* 110* 179* 109*  BUN 22 19 14 16 12   CREATININE 1.29* 0.96 1.08 1.13 1.00  CALCIUM 8.7* 8.6* 8.7* 8.7* 8.7*  MG 2.2 2.2 2.0  --   --   PHOS  --  3.2  --   --   --    GFR: Estimated Creatinine Clearance: 59.9 mL/min (by C-G formula based on SCr of 1 mg/dL). Liver Function Tests: Recent Labs  Lab 05/24/23 2126 05/25/23 1050 05/26/23 0245  AST 18 17 14*  ALT 14 13 12   ALKPHOS 52 34* 37*  BILITOT 0.9 1.3* 1.5*  PROT 7.8 7.0 7.6  ALBUMIN 3.2* 2.9* 2.7*   No results for input(s): "LIPASE", "AMYLASE" in the last 168 hours. No results for input(s): "AMMONIA" in the last 168 hours. Coagulation Profile: Recent Labs  Lab 05/25/23 1734  INR 1.4*   Cardiac Enzymes: No results for input(s): "CKTOTAL", "CKMB", "CKMBINDEX", "TROPONINI" in the last 168 hours. BNP (last 3 results) No results for input(s): "PROBNP" in the last 8760 hours. HbA1C: No results for input(s): "HGBA1C" in the last 72 hours. CBG: Recent Labs  Lab 05/24/23 2117  GLUCAP 140*   Lipid Profile: No results for input(s): "CHOL", "HDL", "LDLCALC", "TRIG", "CHOLHDL", "LDLDIRECT" in the last 72 hours. Thyroid Function Tests: No results for input(s): "TSH", "T4TOTAL", "FREET4", "T3FREE",  "THYROIDAB" in the last 72 hours. Anemia Panel: No results for input(s): "VITAMINB12", "FOLATE", "FERRITIN", "TIBC", "IRON", "RETICCTPCT" in the last 72 hours. Sepsis Labs: No results for input(s): "PROCALCITON", "LATICACIDVEN" in the last 168 hours.  No results found for this or any previous visit (from the past 240 hours).   Radiology Studies: No results found.  Scheduled Meds:  amLODipine  2.5 mg Oral Daily   metoprolol succinate  50 mg Oral Daily   rosuvastatin  5 mg Oral Q48H   Continuous Infusions:  amiodarone 30 mg/hr (05/28/23 1629)   heparin  1,150 Units/hr (05/28/23 1632)    LOS: 3 days   Marguerita Merles, DO [facility] Available via Epic secure chat 7am-7pm After these hours, please refer to coverage provider listed on [email or web address] 05/28/2023, 5:47 PM

## 2023-05-28 NOTE — Plan of Care (Signed)
Problem: Education: Goal: Knowledge of General Education information will improve Description: Including pain rating scale, medication(s)/side effects and non-pharmacologic comfort measures 05/28/2023 0404 by Violet Baldy, RN Outcome: Progressing 05/28/2023 0404 by Violet Baldy, RN Outcome: Progressing 05/28/2023 0403 by Violet Baldy, RN Outcome: Progressing   Problem: Health Behavior/Discharge Planning: Goal: Ability to manage health-related needs will improve 05/28/2023 0404 by Violet Baldy, RN Outcome: Progressing 05/28/2023 0404 by Violet Baldy, RN Outcome: Progressing 05/28/2023 0403 by Violet Baldy, RN Outcome: Progressing   Problem: Clinical Measurements: Goal: Ability to maintain clinical measurements within normal limits will improve 05/28/2023 0404 by Violet Baldy, RN Outcome: Progressing 05/28/2023 0404 by Violet Baldy, RN Outcome: Progressing 05/28/2023 0403 by Violet Baldy, RN Outcome: Progressing Goal: Will remain free from infection 05/28/2023 0404 by Violet Baldy, RN Outcome: Progressing 05/28/2023 0404 by Violet Baldy, RN Outcome: Progressing 05/28/2023 0403 by Violet Baldy, RN Outcome: Progressing Goal: Diagnostic test results will improve 05/28/2023 0404 by Violet Baldy, RN Outcome: Progressing 05/28/2023 0404 by Violet Baldy, RN Outcome: Progressing 05/28/2023 0403 by Violet Baldy, RN Outcome: Progressing Goal: Respiratory complications will improve 05/28/2023 0404 by Violet Baldy, RN Outcome: Progressing 05/28/2023 0404 by Violet Baldy, RN Outcome: Progressing 05/28/2023 0403 by Violet Baldy, RN Outcome: Progressing Goal: Cardiovascular complication will be avoided 05/28/2023 0404 by Violet Baldy, RN Outcome: Progressing 05/28/2023 0404 by Violet Baldy, RN Outcome: Progressing 05/28/2023 0403 by Violet Baldy, RN Outcome: Progressing   Problem:  Activity: Goal: Risk for activity intolerance will decrease 05/28/2023 0404 by Violet Baldy, RN Outcome: Progressing 05/28/2023 0404 by Violet Baldy, RN Outcome: Progressing 05/28/2023 0403 by Violet Baldy, RN Outcome: Progressing   Problem: Nutrition: Goal: Adequate nutrition will be maintained 05/28/2023 0404 by Violet Baldy, RN Outcome: Progressing 05/28/2023 0404 by Violet Baldy, RN Outcome: Progressing 05/28/2023 0403 by Violet Baldy, RN Outcome: Progressing   Problem: Elimination: Goal: Will not experience complications related to bowel motility 05/28/2023 0404 by Violet Baldy, RN Outcome: Progressing 05/28/2023 0404 by Violet Baldy, RN Outcome: Progressing 05/28/2023 0403 by Violet Baldy, RN Outcome: Progressing Goal: Will not experience complications related to urinary retention 05/28/2023 0404 by Violet Baldy, RN Outcome: Progressing 05/28/2023 0404 by Violet Baldy, RN Outcome: Progressing 05/28/2023 0403 by Violet Baldy, RN Outcome: Progressing   Problem: Pain Managment: Goal: General experience of comfort will improve and/or be controlled 05/28/2023 0404 by Violet Baldy, RN Outcome: Progressing 05/28/2023 0404 by Violet Baldy, RN Outcome: Progressing 05/28/2023 0403 by Violet Baldy, RN Outcome: Progressing   Problem: Safety: Goal: Ability to remain free from injury will improve 05/28/2023 0404 by Violet Baldy, RN Outcome: Progressing 05/28/2023 0404 by Violet Baldy, RN Outcome: Progressing 05/28/2023 0403 by Violet Baldy, RN Outcome: Progressing   Problem: Skin Integrity: Goal: Risk for impaired skin integrity will decrease 05/28/2023 0404 by Violet Baldy, RN Outcome: Progressing 05/28/2023 0404 by Violet Baldy, RN Outcome: Progressing 05/28/2023 0403 by Violet Baldy, RN Outcome: Progressing   Problem: Education: Goal: Understanding of CV disease, CV risk  reduction, and recovery process will improve 05/28/2023 0404 by Violet Baldy, RN Outcome: Progressing 05/28/2023 0404 by Violet Baldy, RN Outcome: Progressing 05/28/2023 0403 by Violet Baldy, RN Outcome: Progressing Goal: Individualized Educational Video(s) 05/28/2023 0404 by Violet Baldy, RN Outcome: Progressing 05/28/2023 0404 by Doylene Canning  M, RN Outcome: Progressing 05/28/2023 0403 by Violet Baldy, RN Outcome: Progressing   Problem: Activity: Goal: Ability to return to baseline activity level will improve 05/28/2023 0404 by Violet Baldy, RN Outcome: Progressing 05/28/2023 0404 by Violet Baldy, RN Outcome: Progressing 05/28/2023 0403 by Violet Baldy, RN Outcome: Progressing   Problem: Cardiovascular: Goal: Ability to achieve and maintain adequate cardiovascular perfusion will improve 05/28/2023 0404 by Violet Baldy, RN Outcome: Progressing 05/28/2023 0404 by Violet Baldy, RN Outcome: Progressing 05/28/2023 0403 by Violet Baldy, RN Outcome: Progressing Goal: Vascular access site(s) Level 0-1 will be maintained 05/28/2023 0404 by Violet Baldy, RN Outcome: Progressing 05/28/2023 0404 by Violet Baldy, RN Outcome: Progressing 05/28/2023 0403 by Violet Baldy, RN Outcome: Progressing   Problem: Health Behavior/Discharge Planning: Goal: Ability to safely manage health-related needs after discharge will improve 05/28/2023 0404 by Violet Baldy, RN Outcome: Progressing 05/28/2023 0404 by Violet Baldy, RN Outcome: Progressing 05/28/2023 0403 by Violet Baldy, RN Outcome: Progressing   Problem: Education: Goal: Understanding of CV disease, CV risk reduction, and recovery process will improve 05/28/2023 0404 by Violet Baldy, RN Outcome: Progressing 05/28/2023 0404 by Violet Baldy, RN Outcome: Progressing 05/28/2023 0403 by Violet Baldy, RN Outcome: Progressing   Problem: Activity: Goal:  Ability to return to baseline activity level will improve 05/28/2023 0404 by Violet Baldy, RN Outcome: Progressing 05/28/2023 0404 by Violet Baldy, RN Outcome: Progressing 05/28/2023 0403 by Violet Baldy, RN Outcome: Progressing   Problem: Cardiovascular: Goal: Ability to achieve and maintain adequate cardiovascular perfusion will improve 05/28/2023 0404 by Violet Baldy, RN Outcome: Progressing 05/28/2023 0404 by Violet Baldy, RN Outcome: Progressing 05/28/2023 0403 by Violet Baldy, RN Outcome: Progressing Goal: Vascular access site(s) Level 0-1 will be maintained 05/28/2023 0404 by Violet Baldy, RN Outcome: Progressing 05/28/2023 0404 by Violet Baldy, RN Outcome: Progressing 05/28/2023 0403 by Violet Baldy, RN Outcome: Progressing   Problem: Health Behavior/Discharge Planning: Goal: Ability to safely manage health-related needs after discharge will improve 05/28/2023 0404 by Violet Baldy, RN Outcome: Progressing 05/28/2023 0404 by Violet Baldy, RN Outcome: Progressing 05/28/2023 0403 by Violet Baldy, RN Outcome: Progressing

## 2023-05-28 NOTE — Progress Notes (Signed)
Electrophysiology Progress Note  Patient Name: Thomas [name] Date of Encounter: 05/28/2023  Primary Cardiologist: Julien Nordmann, MD  Electrophysiologist: Dr. Graciela Husbands  Subjective   No complaints at present - no chest pain, light headedness  Inpatient Medications    Scheduled Meds:  amLODipine  2.5 mg Oral Daily   metoprolol succinate  50 mg Oral Daily   rosuvastatin  5 mg Oral Q48H   Continuous Infusions:  amiodarone 30 mg/hr (05/28/23 0341)   heparin 1,150 Units/hr (05/28/23 0343)   PRN Meds: acetaminophen **OR** acetaminophen, metoprolol tartrate, nitroGLYCERIN, ondansetron **OR** ondansetron (ZOFRAN) IV, senna-docusate, sodium chloride flush   Vital Signs    Vitals:   05/27/23 2044 05/28/23 0015 05/28/23 0443 05/28/23 0724  BP: (!) 162/73 (!) 157/68 (!) 160/75 (!) 165/81  Pulse: (!) 59 63 (!) 59 (!) 57  Resp: 19 18 17 18   Temp: 97.9 F (36.6 C) 97.6 F (36.4 C) 98.3 F (36.8 C) 98.1 F (36.7 C)  TempSrc: Oral Oral Oral Oral  SpO2: 93% 93% 95% 95%  Weight:   74.3 kg   Height:        Intake/Output Summary (Last 24 hours) at 05/28/2023 0829 Last data filed at 05/28/2023 0442 Gross per 24 hour  Intake 493.53 ml  Output 670 ml  Net -176.47 ml   Filed Weights   05/26/23 0405 05/27/23 0337 05/28/23 0443  Weight: 74.8 kg 75.9 kg 74.3 kg    Telemetry    A-paced; rare PVCs - Personally Reviewed  ECG    A-paced rhythm with frequent PVCs - Personally Reviewed  Physical Exam   GEN: No acute distress.   Neck: No JVD Cardiac: RRR, no murmurs, rubs, or gallops.  Respiratory: Clear to auscultation bilaterally. GI: Soft, nontender, non-distended  MS: No edema; No deformity. Neuro:  Nonfocal  Psych: Normal affect   Labs    Chemistry Recent Labs  Lab 05/24/23 2126 05/25/23 1050 05/26/23 0245 05/27/23 0835 05/28/23 0232  NA 135 132* 133* 131* 132*  K 3.9 4.2 4.1 3.8 4.1  CL 94* 97* 98 97* 97*  CO2 25 24 24 23 24   GLUCOSE 116* 108* 110* 179* 109*   BUN 22 19 14 16 12   CREATININE 1.29* 0.96 1.08 1.13 1.00  CALCIUM 8.7* 8.6* 8.7* 8.7* 8.7*  PROT 7.8 7.0 7.6  --   --   ALBUMIN 3.2* 2.9* 2.7*  --   --   AST 18 17 14*  --   --   ALT 14 13 12   --   --   ALKPHOS 52 34* 37*  --   --   BILITOT 0.9 1.3* 1.5*  --   --   GFRNONAA 56* >60 >60 >60 >60  ANIONGAP 16* 11 11 11 11      Hematology Recent Labs  Lab 05/25/23 1050 05/26/23 0245 05/28/23 0232  WBC 7.2 7.3 5.0  RBC 3.79* 3.99* 3.88*  HGB 12.4* 13.2 12.8*  HCT 36.2* 37.4* 36.5*  MCV 95.5 93.7 94.1  MCH 32.7 33.1 33.0  MCHC 34.3 35.3 35.1  RDW 13.6 13.5 13.2  PLT 146* 145* 160    Cardiac EnzymesNo results for input(s): "TROPONINI" in the last 168 hours. No results for input(s): "TROPIPOC" in the last 168 hours.   BNP Recent Labs  Lab 05/24/23 2126  BNP 156.3*     DDimer No results for input(s): "DDIMER" in the last 168 hours.   Summary of Pertinent studies    TTE: 05/25/23 -- EF 55-60%, Gr I  diastolic dysfunction  Cardiac cath:   2nd Mrg lesion is 40% stenosed.   RPDA lesion is 20% stenosed.  Imaging: reviewed  Labs: reviewed  Patient Profile     78 y.o. 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   Assessment & Plan    VT Presenting with syncope Normal EF, non-significant disease on LHC Qualifies for secondary prevention ICD Advance imaging would be beneficial -- MRI will need to wait until after his current, mixed system is exchanged for a single vendor ICD system.   History of symptomatic bradycardia - 2:1 AV block Abbott dual chamber pacemaker Medtronic 3830 RV lead, abbott 1231 RA lead System implanted 10/21/2022 44% V-paced Will plan for removal of the RV paced-sensed lead and placement of ICD lead   Frequent pleiomorphic  PVCs Continue amiodarone IV for now; may switch to PO tomorrow PVC burden has significantly improved  History of paroxysmal atrial fibrillation Maintaining sinus rhythm Apixaban on hold pending device upgrade Heparin will need to be held for the procedure; timing to be determined tomorrow AM   For questions or updates, please contact [facility] Please consult [email or web address] for contact info under Cardiology/STEMI.      Signed, Maurice Small, MD 05/28/2023, 8:29 AM

## 2023-05-28 NOTE — Progress Notes (Incomplete)
Patient Name: Thomas [name] Date of Encounter: 05/30/2023  Primary Cardiologist: Julien Nordmann, MD Electrophysiologist: None  Interval Summary   The patient is doing well today.  Ate breakfast. Asking about timing of device.   At this time, the patient denies chest pain, shortness of breath, or any new concerns.  Vital Signs    Vitals:   05/29/23 1536 05/29/23 2104 05/30/23 0015 05/30/23 0425  BP: (!) 161/86 (!) 148/58 (!) 150/69 112/66  Pulse: 62 60 (!) 57 (!) 58  Resp: 18 18 18 18   Temp: 98 F (36.7 C) 98.1 F (36.7 C) 98 F (36.7 C) 97.8 F (36.6 C)  TempSrc: Oral Oral Oral Oral  SpO2: 94% 96% 96% 93%  Weight:    74.8 kg  Height:        Intake/Output Summary (Last 24 hours) at 05/30/2023 0807 Last data filed at 05/30/2023 0427 Gross per 24 hour  Intake --  Output 545 ml  Net -545 ml   Filed Weights   05/28/23 0443 05/29/23 0425 05/30/23 0425  Weight: 74.3 kg 70.3 kg 74.8 kg    Physical Exam    GEN- The patient is well appearing, sitting on side of bed, alert and oriented x 3 today.   Lungs- Clear to ausculation bilaterally, normal work of breathing Cardiac- Regular rate and rhythm, no murmurs, rubs or gallops GI- soft, NT, ND, + BS Extremities- no clubbing or cyanosis. No edema  Telemetry    AP 60-65 bpm (personally reviewed)  Studies:  ECHO 05/25/23 > LVEF 55-60%, G1 DD  LHC 05/26/23 > no significant coronary disease by cardiac catheterization, normal EDP   Hospital Course    Thomas [name] is a 78 y.o. 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 Device interrogation showed VT with cycle lenth of ~220BPM, episode lasted .   Assessment & Plan    VT  Normal LVEF, non-significant CAD on LHC -would be considered secondary prevention ICD if placed  -cMRI would be beneficial but will need to wait until 6 weeks post implant of ICD lead / exchange for single vendor  ICD system (Abbott generator, RA / MDT RV currently) -discussed per Dr. 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  Signed, Canary Brim, NP-C, AGACNP-BC [facility] - Electrophysiology  05/30/2023, 8:07 AM  Reviewed with the patient and colleagues.  The patient is disinclined towards device upgrade.  In the context of the AVID study, similar outcomes were noted in people with monomorphic ventricular tachycardia and reasonable ejection fraction is between amiodarone and  an ICD.  He would far prefer to take the amiodarone then to undergo device procedure.  Looking at the ventricular tachycardia as it starts, the first beat of the VT is similar to the subsequent beat suggesting that this is an automatic or triggered rhythm as opposed to a reentrant rhythm.  He has RVOT PVCs noted on his electrocardiogram suggesting a possible source and mechanism for the ventricular tachycardia that would be consistent.  Moreover, there is some acceleration of the ventricular tachycardia over the first 3 beats again consistent with this mechanism as opposed to reentry.  Given these different  things i.e. his disinclination for device procedure, the avid results, and the mechanism suggested by the electrogram review, I have decided with him that we will continue with amiodarone.  I reached out to colleagues regarding this and just to note that there is some disagreement as to whether he should have an ICD or not.

## 2023-05-28 NOTE — Hospital Course (Addendum)
The patient is a 78 year old Caucasian male with a past medical history significant for but limited to hypertension, hyperlipidemia, paroxysmal atrial flutter, high-grade AV block status post permanent pacemaker who was transferred from [facility] and admitted on 05/24/2023. He presented with syncopal episode after standing and had prodrome of dizziness.  911 was called and he was found to be bradycardic with a heart rate in the 30s.  His pacemaker was interrogated and patient was found to have sustained V. tach noted for 6 minutes.  Subsequently was transferred to [facility] for a EP cardiology consult underwent a heart catheterization for further ischemic workup and current plan is for device upgrade  Assessment and Plan:  Syncope with collapse secondary to sustained VT -Medical cardiology and EP cardiology consulted -Currently on a Amiodarone drip will continue monitor on telemetry; Per Cariology they will plan for transition to po  tomorrow -Continue with metoprolol succinate 50 mg p.o. daily -Echo showed LVEF of 55-60% -Medical Cardiology and EP physician following and currently patient will be undergoing a removal of the RV paced sensing lead and placement of ICD lead for his Abbott dual-chamber pacemaker and the timing bit as per cardiology likely will be Wednesday, 05/31/2023.  Ventricular Tachycardia -Had 6 minutes of ventricular tachycardia on device interrogation -Carvedilol 12.5 mg twice daily and to metoprolol succinate 50 mg p.o. daily -EP cardiologist on board -Patient currently on amiodarone drip -Cardiology consulted and they feel that advanced imaging would be beneficial and recommending MRI but they are recommending to wait until after his current, mixed system was exchanged for a single vendor ICD system; now cardiology deciding to only add an HV lead with leaving the existing RV pacing wire in place and there we will plan MRI at another facility -Cardiology team discussed and the timing  should be   Paroxysmal atrial fibrillation -Currently on heparin drip and plan is to stop at on 05/31/2023 at 12:01 AM -BB as above -Continue to Monitor on Telmetry   Elevated high-sensitivity troponin -Mildly elevated troponin secondary to possibly uncontrolled hypertension -Troponin went from 48 -> 39 -Cardiology on board -Patient underwent cardiac catheterization on 05/26/2023 that showed nonobstructive CAD and Normal EF on ECHO   Essential Hypertension -Continue Amlodipine 2.5 mg p.o. daily and Metoprolol Succinate 50 mg p.o. daily -Patient has IV Metropol tartrate 5 mg every 4 as needed for high blood pressure for systolic blood pressure greater than 80 or heart rate greater than 120 -Continue monitor blood pressures per protocol -Last blood pressure reading was 130/77  Metabolic Acidosis -Has a slight metabolic acidosis with a CO2 of 21, anion gap of 14, chloride level of 97 -Continue to monitor and trend and repeat CMP in a.m.   Dyslipidemia -Continue statin therapy with Rosuvastatin 5 mg every 48 hours  Hyponatremia, relatively stable -Na+ Trend: Recent Labs  Lab 05/24/23 2126 05/25/23 1050 05/26/23 0245 05/27/23 0835 05/28/23 0232 05/29/23 0246  NA 135 132* 133* 131* 132* 132*  -Continue to Monitor and Trend and repeat CMP in the AM  Normocytic Anemia -Hgb/Hct Trend: Recent Labs  Lab 05/24/23 2126 05/25/23 1050 05/26/23 0245 05/28/23 0232 05/29/23 0246  HGB 14.2 12.4* 13.2 12.8* 13.2  HCT 41.8 36.2* 37.4* 36.5* 37.7*  MCV 96.8 95.5 93.7 94.1 94.5  -Check Anemia Panel in the AM -Continue to Monitor for S/Sx of Bleeding; No overt bleeding noted -Repeat CBC in the AM  Hypoalbuminemia -Patient's Albumin Trend: Recent Labs  Lab 05/24/23 2126 05/25/23 1050 05/26/23 0245 05/29/23 0246  ALBUMIN 3.2* 2.9* 2.7* 2.5*  -Continue to Monitor and Trend and repeat CMP in the AM
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
## 2023-05-29 ENCOUNTER — Encounter (HOSPITAL_COMMUNITY): Payer: Self-pay | Admitting: Cardiology

## 2023-05-29 DIAGNOSIS — I10 Essential (primary) hypertension: Secondary | ICD-10-CM | POA: Diagnosis not present

## 2023-05-29 DIAGNOSIS — I480 Paroxysmal atrial fibrillation: Secondary | ICD-10-CM

## 2023-05-29 DIAGNOSIS — F419 Anxiety disorder, unspecified: Secondary | ICD-10-CM

## 2023-05-29 DIAGNOSIS — E785 Hyperlipidemia, unspecified: Secondary | ICD-10-CM | POA: Diagnosis not present

## 2023-05-29 DIAGNOSIS — I472 Ventricular tachycardia, unspecified: Secondary | ICD-10-CM | POA: Diagnosis not present

## 2023-05-29 DIAGNOSIS — R55 Syncope and collapse: Secondary | ICD-10-CM | POA: Diagnosis not present

## 2023-05-29 LAB — CBC WITH DIFFERENTIAL/PLATELET
Abs Immature Granulocytes: 0.01 10*3/uL (ref 0.00–0.07)
Basophils Absolute: 0 10*3/uL (ref 0.0–0.1)
Basophils Relative: 0 %
Eosinophils Absolute: 0.2 10*3/uL (ref 0.0–0.5)
Eosinophils Relative: 5 %
HCT: 37.7 % — ABNORMAL LOW (ref 39.0–52.0)
Hemoglobin: 13.2 g/dL (ref 13.0–17.0)
Immature Granulocytes: 0 %
Lymphocytes Relative: 32 %
Lymphs Abs: 1.6 10*3/uL (ref 0.7–4.0)
MCH: 33.1 pg (ref 26.0–34.0)
MCHC: 35 g/dL (ref 30.0–36.0)
MCV: 94.5 fL (ref 80.0–100.0)
Monocytes Absolute: 0.4 10*3/uL (ref 0.1–1.0)
Monocytes Relative: 9 %
Neutro Abs: 2.6 10*3/uL (ref 1.7–7.7)
Neutrophils Relative %: 54 %
Platelets: 158 10*3/uL (ref 150–400)
RBC: 3.99 MIL/uL — ABNORMAL LOW (ref 4.22–5.81)
RDW: 13.2 % (ref 11.5–15.5)
WBC: 4.8 10*3/uL (ref 4.0–10.5)
nRBC: 0 % (ref 0.0–0.2)

## 2023-05-29 LAB — COMPREHENSIVE METABOLIC PANEL
ALT: 14 U/L (ref 0–44)
AST: 22 U/L (ref 15–41)
Albumin: 2.5 g/dL — ABNORMAL LOW (ref 3.5–5.0)
Alkaline Phosphatase: 37 U/L — ABNORMAL LOW (ref 38–126)
Anion gap: 14 (ref 5–15)
BUN: 13 mg/dL (ref 8–23)
CO2: 21 mmol/L — ABNORMAL LOW (ref 22–32)
Calcium: 8.5 mg/dL — ABNORMAL LOW (ref 8.9–10.3)
Chloride: 97 mmol/L — ABNORMAL LOW (ref 98–111)
Creatinine, Ser: 1.08 mg/dL (ref 0.61–1.24)
GFR, Estimated: >60 mL/min (ref >60)
Glucose, Bld: 101 mg/dL — ABNORMAL HIGH (ref 70–99)
Potassium: 4 mmol/L (ref 3.5–5.1)
Sodium: 132 mmol/L — ABNORMAL LOW (ref 135–145)
Total Bilirubin: 0.9 mg/dL (ref 0.0–1.2)
Total Protein: 7.4 g/dL (ref 6.5–8.1)

## 2023-05-29 LAB — RETICULOCYTES
Immature Retic Fract: 10.6 % (ref 2.3–15.9)
RBC.: 4.01 MIL/uL — ABNORMAL LOW (ref 4.22–5.81)
Retic Count, Absolute: 50.1 10*3/uL (ref 19.0–186.0)
Retic Ct Pct: 1.3 % (ref 0.4–3.1)

## 2023-05-29 LAB — FOLATE: Folate: 9 ng/mL (ref >5.9)

## 2023-05-29 LAB — HEPARIN LEVEL (UNFRACTIONATED): Heparin Unfractionated: 0.39 [IU]/mL (ref 0.30–0.70)

## 2023-05-29 LAB — MAGNESIUM: Magnesium: 2.1 mg/dL (ref 1.7–2.4)

## 2023-05-29 LAB — PHOSPHORUS: Phosphorus: 3.9 mg/dL (ref 2.5–4.6)

## 2023-05-29 LAB — SURGICAL PCR SCREEN
MRSA, PCR: NEGATIVE
Staphylococcus aureus: NEGATIVE

## 2023-05-29 LAB — GLUCOSE, CAPILLARY: Glucose-Capillary: 117 mg/dL — ABNORMAL HIGH (ref 70–99)

## 2023-05-29 NOTE — Progress Notes (Signed)
RN asked patient if patient would like to review the informed consent. Patient deferred signing the informed consent once more, would like to talk to spouse. Consent - unsigned in the chart.

## 2023-05-29 NOTE — Progress Notes (Signed)
   05/29/23 1200  Infiltration/Extravasation/Phlebitis Assessment  Site RFA  Date Pharmacy Contacted 05/29/23  Time Pharmacy Contacted 1205  Infiltration/Extravasation Grading Scale 1  Peripheral IV 05/24/23 20 G Left;Posterior Forearm  Placement Date/Time: 05/24/23 2112   Inserted prior to hospital arrival?: Placed by EMS/Paramedic  Size (Gauge): 20 G  Orientation: Left;Posterior  Location: Forearm  Insertion attempts: 1  Patient Tolerance: Tolerated well  Site Assessment Clean, Dry, Intact  Line Status Flushed;Saline locked  Dressing Type Transparent  Dressing Status Clean, Dry, Intact  Dressing Change Due 06/03/23  Peripheral IV 05/24/23 18 G Left Antecubital  Placement Date/Time: 05/24/23 2135   Size (Gauge): 18 G  Orientation: Left  Location: Antecubital  Site Prep: Chlorhexidine (preferred);Skin Prep Completely Dry at the Time of First Skin Puncture  Local Anesthetic: None  Insertion attempts: 1  Patient...  Site Assessment Clean, Dry, Intact  Line Status Flushed;Saline locked  Dressing Type Transparent  Dressing Status Clean, Dry, Intact  Dressing Change Due 06/03/23   RN notified pharmacy of R PIV infiltration with amiodarone gtt infusing. Pharmacy recommend to elevate and apply heat. RN marked skin and NT removed PIV.

## 2023-05-29 NOTE — Consult Note (Signed)
Value-Based [facility] Liaison Consult Note   05/29/2023  Thomas [name] [birth year] [id number]  Insurance: Orpah Clinton   Primary Care Provider: Smitty Cords, DO with [facility], this provider is listed for the transition of care follow up appointments  and [facility] calls   [facility] Liaison met patient at bedside at [facility].    The patient was screened for 7 and 30 day readmission hospitalization [this was a tyransfer from [facility] to [facility]] with noted medium risk score for unplanned readmission risk 1 hospital admissions in 6 months.  The patient was assessed for potential Community Care Coordination service needs for post hospital transition for care coordination. Review of patient's electronic medical record reveals patient is with ongoing medical/surgical interventions noted.   Plan: [facility] Liaison will continue to follow progress and disposition to asess for post hospital community care coordination/management needs.  Referral request for community care coordination: ***   VBCI Community Care, Population Health does not replace or interfere with any arrangements made by the Inpatient Transition of Care team.   For questions contact:   Charlesetta Shanks, RN, BSN, CCM [place]  [facility] Liaison Direct Dial: [phone] or secure chat Email: [email or web address]
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
## 2023-05-29 NOTE — Plan of Care (Signed)
Problem: Clinical Measurements: Goal: Will remain free from infection Outcome: Progressing Goal: Diagnostic test results will improve Outcome: Progressing

## 2023-05-29 NOTE — Progress Notes (Signed)
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 He presented with syncopal episode after standing and had prodrome of dizziness.  911 was called and he was found to be bradycardic with a heart rate in the 30s.  His pacemaker was interrogated and patient was found to have sustained V. tach noted for 6 minutes.  Subsequently was transferred to [facility] for a EP cardiology consult underwent a heart catheterization for further ischemic workup and current plan is for device upgrade  Assessment and Plan:  Syncope with collapse secondary to sustained VT -Medical cardiology and EP cardiology consulted -Currently on a Amiodarone drip will continue monitor on telemetry; Per Cariology may switch to po  tomorrow -Continue with metoprolol succinate 50 mg p.o. daily -Echo showed LVEF of 55-60% -Medical Cardiology and EP physician following and currently patient will be undergoing a removal of the RV paced sensing lead and placement of ICD lead for his Abbott dual-chamber pacemaker   Ventricular tachycardia -Had 6 minutes of ventricular tachycardia on device interrogation -Carvedilol 12.5 mg twice daily and to metoprolol succinate 50 mg p.o. daily -EP cardiologist on board -Patient currently on amiodarone drip -Neurology they feel that advanced imaging would be beneficial and recommending MRI but they are recommending to wait until after his current, mixed system was exchanged for a single vendor ICD system   Paroxysmal atrial fibrillation -Currently on heparin drip -BB as above -Continue to Monitor on Telmetry   Elevated high-sensitivity  troponin -Mildly elevated troponin secondary to possibly uncontrolled hypertension -Cardiology on board -Patient underwent cardiac catheterization on 05/26/2023 that showed nonobstructive CAD and Normal EF on ECHO   Essential Hypertension -Continue Amlodipine 2.5 mg p.o. daily and Metoprolol Succinate 50 mg p.o. daily -Patient has IV Metropol tartrate 5 mg every 4 as needed for high blood pressure for systolic blood pressure greater than 80 or heart rate greater than 120 -Continue monitor blood pressures per protocol -Last blood pressure reading was elevated to 174/76   Dyslipidemia -Continue statin therapy with Rosuvastatin 5 mg every 48 hours  Hyponatremia -Na+ Trend: Recent Labs  Lab 05/24/23 2126 05/25/23 1050 05/26/23 0245 05/27/23 0835 05/28/23 0232  NA 135 132* 133* 131* 132*  -Continue to Monitor and Trend and repeat CMP in the AM  Normocytic Anemia -Hgb/Hct Trend: Recent Labs  Lab 05/24/23 2126 05/25/23 1050 05/26/23 0245 05/28/23 0232  HGB 14.2 12.4* 13.2 12.8*  HCT 41.8 36.2* 37.4* 36.5*  MCV 96.8 95.5 93.7 94.1  -Check Anemia Panel in the AM -Continue to Monitor for S/Sx of Bleeding; No overt bleeding noted -Repeat CBC in the AM  Hypoalbuminemia -Patient's Albumin Trend: Recent Labs  Lab 05/24/23 2126 05/25/23 1050 05/26/23 0245  ALBUMIN 3.2* 2.9* 2.7*  -Continue to Monitor and Trend and repeat CMP in the AM   DVT prophylaxis: Anticoagulated with a Heparin gtt    Code Status: Full Code Family Communication: No family present at bedside  Disposition Plan:  Level of care: Telemetry Cardiac Status is: Inpatient Remains inpatient appropriate because: Further clinical improvement and clearance by the specialists   Consultants:  Medical Cardiology EP Cardiology  Procedures:  ECHOCARDIOGRAM IMPRESSIONS     1. Left ventricular ejection fraction,  by estimation, is 55 to 60%. Left  ventricular ejection fraction by 2D MOD biplane is 54.1 %. The  left  ventricle has normal function. The left ventricle demonstrates regional  wall motion abnormalities (septal wall  motion consistent with conduction abnormality). Left ventricular diastolic  parameters are consistent with Grade I diastolic dysfunction (impaired  relaxation).   2. Right ventricular systolic function is normal. The right ventricular  size is normal. There is mildly elevated pulmonary artery systolic  pressure. The estimated right ventricular systolic pressure is 38.5 mmHg.   3. The mitral valve is normal in structure. Mild mitral valve  regurgitation. No evidence of mitral stenosis.   4. The aortic valve is normal in structure. Aortic valve regurgitation is  mild. No aortic stenosis is present.   5. The inferior vena cava is normal in size with greater than 50%  respiratory variability, suggesting right atrial pressure of 3 mmHg.   6. Frequent PVCs noted.   FINDINGS   Left Ventricle: Left ventricular ejection fraction, by estimation, is 55  to 60%. Left ventricular ejection fraction by 2D MOD biplane is 54.1 %.  The left ventricle has normal function. The left ventricle demonstrates  regional wall motion abnormalities.  The left ventricular internal cavity size was normal in size. There is no  left ventricular hypertrophy. Left ventricular diastolic parameters are  consistent with Grade I diastolic dysfunction (impaired relaxation).   Right Ventricle: The right ventricular size is normal. No increase in  right ventricular wall thickness. Right ventricular systolic function is  normal. There is mildly elevated pulmonary artery systolic pressure. The  tricuspid regurgitant velocity is 2.76   m/s, and with an assumed right atrial pressure of 8 mmHg, the estimated  right ventricular systolic pressure is 38.5 mmHg.   Left Atrium: Left atrial size was normal in size.   Right Atrium: Right atrial size was normal in size.   Pericardium: There is no evidence of  pericardial effusion.   Mitral Valve: The mitral valve is normal in structure. Mild mitral valve  regurgitation. No evidence of mitral valve stenosis. MV peak gradient, 3.6  mmHg. The mean mitral valve gradient is 2.0 mmHg.   Tricuspid Valve: The tricuspid valve is normal in structure. Tricuspid  valve regurgitation is mild . No evidence of tricuspid stenosis.   Aortic Valve: The aortic valve is normal in structure. Aortic valve  regurgitation is mild. No aortic stenosis is present. Aortic valve mean  gradient measures 4.0 mmHg. Aortic valve peak gradient measures 6.9 mmHg.  Aortic valve area, by VTI measures 2.57  cm.   Pulmonic Valve: The pulmonic valve was normal in structure. Pulmonic valve  regurgitation is not visualized. No evidence of pulmonic stenosis.   Aorta: The aortic root is normal in size and structure.   Venous: The inferior vena cava is normal in size with greater than 50%  respiratory variability, suggesting right atrial pressure of 3 mmHg.   IAS/Shunts: No atrial level shunt detected by color flow Doppler.     LEFT VENTRICLE  PLAX 2D                        Biplane EF (MOD)  LVIDd:         5.60 cm         LV Biplane EF:   Left  LVIDs:         3.80 cm  ventricular  LV PW:         1.00 cm                          ejection  LV IVS:        1.60 cm                          fraction by  LVOT diam:     2.00 cm                          2D MOD  LV SV:         75                               biplane is  LV SV Index:   39                               54.1 %.  LVOT Area:     3.14 cm                                 Diastology                                 LV e' medial:    4.54 cm/s  LV Volumes (MOD)               LV E/e' medial:  14.4  LV vol d, MOD    87.7 ml       LV e' lateral:   7.22 cm/s  A2C:                           LV E/e' lateral: 9.1  LV vol d, MOD    83.3 ml  A4C:  LV vol s, MOD    35.9 ml  A2C:  LV vol s, MOD    39.7 ml   A4C:  LV SV MOD A2C:   51.8 ml  LV SV MOD A4C:   83.3 ml  LV SV MOD BP:    46.8 ml   RIGHT VENTRICLE  RV Basal diam:  3.35 cm  RV Mid diam:    3.00 cm  RV S prime:     11.70 cm/s  TAPSE (M-mode): 2.6 cm   LEFT ATRIUM             Index        RIGHT ATRIUM           Index  LA diam:        4.40 cm 2.26 cm/m   RA Area:     19.70 cm  LA Vol (A2C):   63.2 ml 32.52 ml/m  RA Volume:   52.70 ml  27.12 ml/m  LA Vol (A4C):   58.5 ml 30.11 ml/m  LA Biplane Vol: 63.1 ml 32.47 ml/m   AORTIC VALVE                    PULMONIC VALVE  AV Area (Vmax):    2.42 cm     PV Vmax:  0.83 m/s  AV Area (Vmean):   2.12 cm     PV Peak grad:  2.8 mmHg  AV Area (VTI):     2.57 cm  AV Vmax:           131.00 cm/s  AV Vmean:          91.100 cm/s  AV VTI:            0.293 m  AV Peak Grad:      6.9 mmHg  AV Mean Grad:      4.0 mmHg  LVOT Vmax:         101.00 cm/s  LVOT Vmean:        61.400 cm/s  LVOT VTI:          0.240 m  LVOT/AV VTI ratio: 0.82    AORTA  Ao Root diam: 4.00 cm   MITRAL VALVE               TRICUSPID VALVE  MV Area (PHT): 2.22 cm    TR Peak grad:   30.5 mmHg  MV Area VTI:   2.63 cm    TR Vmax:        276.00 cm/s  MV Peak grad:  3.6 mmHg  MV Mean grad:  2.0 mmHg    SHUNTS  MV Vmax:       0.95 m/s    Systemic VTI:  0.24 m  MV Vmean:      58.7 cm/s   Systemic Diam: 2.00 cm  MV Decel Time: 342 msec  MV E velocity: 65.60 cm/s  MV A velocity: 88.70 cm/s  MV E/A ratio:  0.74    CARDIAC CATHETERIZATION   2nd Mrg lesion is 40% stenosed.   RPDA lesion is 20% stenosed.   Left Heart Catheterization 05/26/23: Hemodynamic data: LVEDP 6 mmHg.  There is no pressure gradient across the aortic valve.   Angiographic data: LM: Large-caliber vessel.  Smooth and normal. LAD: Very large caliber vessel.  Moderate to severe periarterial calcification noted in the proximal and mid LAD.  Gives origin to 3 small to moderate-sized diagonals.  There is mild luminal irregularity in the LAD.  LAD  ends at the apex. LCx: Is a large-caliber vessel.  Gives origin to large OM 2 and continues as a OM 3, 40% stenosis in proximal OM1 and a 30% stenosis in mid CX distal to OM1. RCA: Very large caliber vessel, there is proximal 20% stenosis and PDA has mild 20% stenosis.  Otherwise there is mild luminal irregularity.      Impression and recommendations: No significant coronary disease by cardiac catheterization.  Normal EDP.    Antimicrobials:  Anti-infectives (From admission, onward)    None       Subjective: Seen and examined at bedside and was eating his breakfast.  No nausea or vomiting.  Denies lightheadedness or dizziness.  No other concerns or questions.  Objective: Vitals:   05/28/23 2350 05/29/23 0425 05/29/23 0817 05/29/23 0950  BP: (!) 144/82 (!) 155/84 (!) 164/79 130/77  Pulse: 61 (!) 59 (!) 59 60  Resp: 18 18 18 16   Temp: 98 F (36.7 C) 98 F (36.7 C) 98 F (36.7 C)   TempSrc: Oral Oral Oral   SpO2: 94% 95% 94% 95%  Weight:  70.3 kg    Height:        Intake/Output Summary (Last 24 hours) at 05/29/2023 1408 Last data filed at 05/29/2023 0953 Gross per 24 hour  Intake  332.3 ml  Output 1425 ml  Net -1092.7 ml   Filed Weights   05/27/23 4782 05/28/23 0443 05/29/23 0425  Weight: 75.9 kg 74.3 kg 70.3 kg   Examination: Physical Exam:  Constitutional: Elderly Caucasian male in no acute distress Respiratory: Diminished to auscultation bilaterally, no wheezing, rales, rhonchi or crackles. Normal respiratory effort and patient is not tachypenic. No accessory muscle use.  Unlabored breathing Cardiovascular: RRR, no murmurs / rubs / gallops. S1 and S2 auscultated. No appreciable extremity edema.  Abdomen: Soft, non-tender, non-distended. Bowel sounds positive.  GU: Deferred. Musculoskeletal: No clubbing / cyanosis of digits/nails. No joint deformity upper and lower extremities. Skin: No rashes, lesions, ulcers on a limited skin evaluation. No induration; Warm and  dry.  Neurologic: CN 2-12 grossly intact with no focal deficits. Romberg sign and cerebellar reflexes not assessed.  Psychiatric: Normal judgment and insight. Alert and oriented x 3. Normal mood and appropriate affect.   Data Reviewed: I have personally reviewed following labs and imaging studies  CBC: Recent Labs  Lab 05/24/23 2126 05/25/23 1050 05/26/23 0245 05/28/23 0232 05/29/23 0246  WBC 6.7 7.2 7.3 5.0 4.8  NEUTROABS 4.0 5.1 5.0  --  2.6  HGB 14.2 12.4* 13.2 12.8* 13.2  HCT 41.8 36.2* 37.4* 36.5* 37.7*  MCV 96.8 95.5 93.7 94.1 94.5  PLT 148* 146* 145* 160 158   Basic Metabolic Panel: Recent Labs  Lab 05/24/23 2126 05/25/23 1050 05/26/23 0245 05/27/23 0835 05/28/23 0232 05/29/23 0246  NA 135 132* 133* 131* 132* 132*  K 3.9 4.2 4.1 3.8 4.1 4.0  CL 94* 97* 98 97* 97* 97*  CO2 25 24 24 23 24  21*  GLUCOSE 116* 108* 110* 179* 109* 101*  BUN 22 19 14 16 12 13   CREATININE 1.29* 0.96 1.08 1.13 1.00 1.08  CALCIUM 8.7* 8.6* 8.7* 8.7* 8.7* 8.5*  MG 2.2 2.2 2.0  --   --  2.1  PHOS  --  3.2  --   --   --  3.9   GFR: Estimated Creatinine Clearance: 55.1 mL/min (by C-G formula based on SCr of 1.08 mg/dL). Liver Function Tests: Recent Labs  Lab 05/24/23 2126 05/25/23 1050 05/26/23 0245 05/29/23 0246  AST 18 17 14* 22  ALT 14 13 12 14   ALKPHOS 52 34* 37* 37*  BILITOT 0.9 1.3* 1.5* 0.9  PROT 7.8 7.0 7.6 7.4  ALBUMIN 3.2* 2.9* 2.7* 2.5*   No results for input(s): "LIPASE", "AMYLASE" in the last 168 hours. No results for input(s): "AMMONIA" in the last 168 hours. Coagulation Profile: Recent Labs  Lab 05/25/23 1734  INR 1.4*   Cardiac Enzymes: No results for input(s): "CKTOTAL", "CKMB", "CKMBINDEX", "TROPONINI" in the last 168 hours. BNP (last 3 results) No results for input(s): "PROBNP" in the last 8760 hours. HbA1C: No results for input(s): "HGBA1C" in the last 72 hours. CBG: Recent Labs  Lab 05/24/23 2117  GLUCAP 140*   Lipid Profile: No results for  input(s): "CHOL", "HDL", "LDLCALC", "TRIG", "CHOLHDL", "LDLDIRECT" in the last 72 hours. Thyroid Function Tests: No results for input(s): "TSH", "T4TOTAL", "FREET4", "T3FREE", "THYROIDAB" in the last 72 hours. Anemia Panel: No results for input(s): "VITAMINB12", "FOLATE", "FERRITIN", "TIBC", "IRON", "RETICCTPCT" in the last 72 hours. Sepsis Labs: No results for input(s): "PROCALCITON", "LATICACIDVEN" in the last 168 hours.  No results found for this or any previous visit (from the past 240 hours).   Radiology Studies: No results found.  Scheduled Meds:  amLODipine  2.5 mg Oral Daily  metoprolol succinate  50 mg Oral Daily   rosuvastatin  5 mg Oral Q48H   Continuous Infusions:  amiodarone 30 mg/hr (05/29/23 0501)   heparin 1,150 Units/hr (05/29/23 0100)    LOS: 4 days   Marguerita Merles, DO [facility] Available via Epic secure chat 7am-7pm After these hours, please refer to coverage provider listed on [email or web address] 05/29/2023, 2:08 PM

## 2023-05-29 NOTE — Progress Notes (Cosign Needed)
Rounding Note    Patient Name: Thomas [name] Date of Encounter: 05/29/2023  [facility] Cardiologist: Julien Nordmann, MD   Subjective   Feels well  Inpatient Medications    Scheduled Meds:  amLODipine  2.5 mg Oral Daily   metoprolol succinate  50 mg Oral Daily   rosuvastatin  5 mg Oral Q48H   Continuous Infusions:  amiodarone 30 mg/hr (05/29/23 0501)   heparin 1,150 Units/hr (05/29/23 0100)   PRN Meds: acetaminophen **OR** acetaminophen, metoprolol tartrate, nitroGLYCERIN, ondansetron **OR** ondansetron (ZOFRAN) IV, senna-docusate, sodium chloride flush   Vital Signs    Vitals:   05/28/23 1550 05/28/23 1929 05/28/23 2350 05/29/23 0425  BP: (!) 174/76 (!) 149/75 (!) 144/82 (!) 155/84  Pulse: (!) 58 60 61 (!) 59  Resp: 18 16 18 18   Temp: 98.7 F (37.1 C) 98.5 F (36.9 C) 98 F (36.7 C) 98 F (36.7 C)  TempSrc: Oral Oral Oral Oral  SpO2: 95% 95% 94% 95%  Weight:    70.3 kg  Height:        Intake/Output Summary (Last 24 hours) at 05/29/2023 0740 Last data filed at 05/29/2023 0427 Gross per 24 hour  Intake 954.26 ml  Output 1975 ml  Net -1020.74 ml      05/29/2023    4:25 AM 05/28/2023    4:43 AM 05/27/2023    3:37 AM  Last 3 Weights  Weight (lbs) 155 lb 163 lb 12.8 oz 167 lb 5.3 oz  Weight (kg) 70.308 kg 74.3 kg 75.9 kg      Telemetry    SR/VP, some AV pacing, PVCs eliminated - Personally Reviewed  ECG    No new EKGs - Personally Reviewed  Physical Exam   GEN: No acute distress.   Neck: No JVD Cardiac: RRR, no murmurs, rubs, or gallops.  Respiratory: CTA b/l. GI: Soft, nontender, non-distended  MS: No edema; No deformity. Neuro:  Nonfocal  Psych: Normal affect   Labs    High Sensitivity Troponin:   Recent Labs  Lab 05/24/23 2126 05/24/23 2354 05/25/23 0538 05/25/23 0926  TROPONINIHS 16 39* 48* 39*     Chemistry Recent Labs  Lab 05/25/23 1050 05/26/23 0245 05/27/23 0835 05/28/23 0232 05/29/23 0246  NA 132* 133*  131* 132* 132*  K 4.2 4.1 3.8 4.1 4.0  CL 97* 98 97* 97* 97*  CO2 24 24 23 24  21*  GLUCOSE 108* 110* 179* 109* 101*  BUN 19 14 16 12 13   CREATININE 0.96 1.08 1.13 1.00 1.08  CALCIUM 8.6* 8.7* 8.7* 8.7* 8.5*  MG 2.2 2.0  --   --  2.1  PROT 7.0 7.6  --   --  7.4  ALBUMIN 2.9* 2.7*  --   --  2.5*  AST 17 14*  --   --  22  ALT 13 12  --   --  14  ALKPHOS 34* 37*  --   --  37*  BILITOT 1.3* 1.5*  --   --  0.9  GFRNONAA >60 >60 >60 >60 >60  ANIONGAP 11 11 11 11 14     Lipids No results for input(s): "CHOL", "TRIG", "HDL", "LABVLDL", "LDLCALC", "CHOLHDL" in the last 168 hours.  Hematology Recent Labs  Lab 05/26/23 0245 05/28/23 0232 05/29/23 0246  WBC 7.3 5.0 4.8  RBC 3.99* 3.88* 3.99*  HGB 13.2 12.8* 13.2  HCT 37.4* 36.5* 37.7*  MCV 93.7 94.1 94.5  MCH 33.1 33.0 33.1  MCHC 35.3 35.1 35.0  RDW  13.5 13.2 13.2  PLT 145* 160 158   Thyroid No results for input(s): "TSH", "FREET4" in the last 168 hours.  BNP Recent Labs  Lab 05/24/23 2126  BNP 156.3*    DDimer No results for input(s): "DDIMER" in the last 168 hours.   Radiology    No results found.  Cardiac Studies   05/26/23: LHC   2nd Mrg lesion is 40% stenosed.   RPDA lesion is 20% stenosed.   Left Heart Catheterization 05/26/23: Hemodynamic data: LVEDP 6 mmHg.  There is no pressure gradient across the aortic valve.     05/25/23: TTE 1. Left ventricular ejection fraction, by estimation, is 55 to 60%. Left  ventricular ejection fraction by 2D MOD biplane is 54.1 %. The left  ventricle has normal function. The left ventricle demonstrates regional  wall motion abnormalities (septal wall  motion consistent with conduction abnormality). Left ventricular diastolic  parameters are consistent with Grade I diastolic dysfunction (impaired  relaxation).   2. Right ventricular systolic function is normal. The right ventricular  size is normal. There is mildly elevated pulmonary artery systolic  pressure. The estimated  right ventricular systolic pressure is 38.5 mmHg.   3. The mitral valve is normal in structure. Mild mitral valve  regurgitation. No evidence of mitral stenosis.   4. The aortic valve is normal in structure. Aortic valve regurgitation is  mild. No aortic stenosis is present.   5. The inferior vena cava is normal in size with greater than 50%  respiratory variability, suggesting right atrial pressure of 3 mmHg.   6. Frequent PVCs noted.   Patient Profile     78 y.o. male w/PMHx of HTN, HLD, advanced AV block w/PPM, RBBB, AFib, PVCs admitted with syncope >> pacer interogation noted episode VT (200's bpm) correlating with time of his event  Device information Abbott dual chamber PPM implanted 10/21/22 Has a MDT 3830 in the RV (mid-distal septum)   AFib Diagnosed post-op (cholecystectomy) 2018 No AAD to date   Device interrogation this admission: battery and lead measurements are good One HVR episodes C/w VT (dual tachycardia w/Afib) At least 6 minute duration  Assessment & Plan     Syncope VT LHC 1/24 with no obstructive disease Preserved LVEF Neg HS Trops No significant electrolyte derangement Very high PVC burden >> eliminated coreg to Toprol this admission Amiodarone added this admission >> plan for PO tomorrow   He will need device upgrade RV pacing lead removal >> HV lead RV paces 38% Will plan to make his device system MRI compatible with a unified system at time of upgrade MRI down the road to look for infiltrative disease  Tentatively discussed with Dr. Graciela Husbands today May decided to only add an HV lead, leaving his existing RV pacing wire in place and plan MRI at another facility   Timing will be Wednesday with Dr. Graciela Husbands  Pt is aware no driving 6 months     Paroxysmal Afib CHA2DS2Vasc is 2, on Eliquis out pt > heparin here 1.7 % burden Will need to stop heparin for ICD once timing of procedure is known Ordered to stop heparin 05/31/23 @ 0001   For  questions or updates, please contact [facility] Please consult [email or web address] for contact info under        Signed, Sheilah Pigeon, PA-C  05/29/2023, 7:40 AM    As above, discussion is to proceed with ICD upgrade for ventricular tachycardia will continue amiodarone overnight and transition to oral in the  morning

## 2023-05-29 NOTE — Plan of Care (Signed)
Problem: Education: Goal: Knowledge of General Education information will improve Description Including pain rating scale, medication(s)/side effects and non-pharmacologic comfort measures Outcome: Progressing   Problem: Health Behavior/Discharge Planning: Goal: Ability to manage health-related needs will improve Outcome: Progressing

## 2023-05-29 NOTE — Progress Notes (Signed)
RN educated patient on procedure and provided the informed consent. Patient would like to speak to EP team about concerns of current device and deferred signing the informed consent for the procedure. RN verbalize understanding. RN notified EP team. EP team to round. Consent located in patient's chart.

## 2023-05-30 DIAGNOSIS — I480 Paroxysmal atrial fibrillation: Secondary | ICD-10-CM | POA: Diagnosis not present

## 2023-05-30 DIAGNOSIS — F419 Anxiety disorder, unspecified: Secondary | ICD-10-CM | POA: Diagnosis not present

## 2023-05-30 DIAGNOSIS — I10 Essential (primary) hypertension: Secondary | ICD-10-CM | POA: Diagnosis not present

## 2023-05-30 DIAGNOSIS — E785 Hyperlipidemia, unspecified: Secondary | ICD-10-CM | POA: Diagnosis not present

## 2023-05-30 DIAGNOSIS — R55 Syncope and collapse: Secondary | ICD-10-CM | POA: Diagnosis not present

## 2023-05-30 DIAGNOSIS — I472 Ventricular tachycardia, unspecified: Secondary | ICD-10-CM | POA: Diagnosis not present

## 2023-05-30 LAB — COMPREHENSIVE METABOLIC PANEL
ALT: 17 U/L (ref 0–44)
AST: 18 U/L (ref 15–41)
Albumin: 2.5 g/dL — ABNORMAL LOW (ref 3.5–5.0)
Alkaline Phosphatase: 32 U/L — ABNORMAL LOW (ref 38–126)
Anion gap: 12 (ref 5–15)
BUN: 15 mg/dL (ref 8–23)
CO2: 23 mmol/L (ref 22–32)
Calcium: 8.8 mg/dL — ABNORMAL LOW (ref 8.9–10.3)
Chloride: 96 mmol/L — ABNORMAL LOW (ref 98–111)
Creatinine, Ser: 1.02 mg/dL (ref 0.61–1.24)
GFR, Estimated: >60 mL/min (ref >60)
Glucose, Bld: 103 mg/dL — ABNORMAL HIGH (ref 70–99)
Potassium: 3.8 mmol/L (ref 3.5–5.1)
Sodium: 131 mmol/L — ABNORMAL LOW (ref 135–145)
Total Bilirubin: 1 mg/dL (ref 0.0–1.2)
Total Protein: 7.4 g/dL (ref 6.5–8.1)

## 2023-05-30 LAB — CBC WITH DIFFERENTIAL/PLATELET
Abs Immature Granulocytes: 0.02 10*3/uL (ref 0.00–0.07)
Basophils Absolute: 0 10*3/uL (ref 0.0–0.1)
Basophils Relative: 0 %
Eosinophils Absolute: 0.2 10*3/uL (ref 0.0–0.5)
Eosinophils Relative: 4 %
HCT: 36 % — ABNORMAL LOW (ref 39.0–52.0)
Hemoglobin: 12.9 g/dL — ABNORMAL LOW (ref 13.0–17.0)
Immature Granulocytes: 0 %
Lymphocytes Relative: 26 %
Lymphs Abs: 1.7 10*3/uL (ref 0.7–4.0)
MCH: 33 pg (ref 26.0–34.0)
MCHC: 35.8 g/dL (ref 30.0–36.0)
MCV: 92.1 fL (ref 80.0–100.0)
Monocytes Absolute: 0.5 10*3/uL (ref 0.1–1.0)
Monocytes Relative: 8 %
Neutro Abs: 4.1 10*3/uL (ref 1.7–7.7)
Neutrophils Relative %: 62 %
Platelets: 188 10*3/uL (ref 150–400)
RBC: 3.91 MIL/uL — ABNORMAL LOW (ref 4.22–5.81)
RDW: 13.2 % (ref 11.5–15.5)
WBC: 6.6 10*3/uL (ref 4.0–10.5)
nRBC: 0 % (ref 0.0–0.2)

## 2023-05-30 LAB — VITAMIN B12: Vitamin B-12: 107 pg/mL — ABNORMAL LOW (ref 180–914)

## 2023-05-30 LAB — IRON AND TIBC
Iron: 41 ug/dL — ABNORMAL LOW (ref 45–182)
Saturation Ratios: 17 % — ABNORMAL LOW (ref 17.9–39.5)
TIBC: 238 ug/dL — ABNORMAL LOW (ref 250–450)
UIBC: 197 ug/dL

## 2023-05-30 LAB — LIPOPROTEIN A (LPA): Lipoprotein (a): 42.4 nmol/L — ABNORMAL HIGH (ref <75.0)

## 2023-05-30 LAB — GLUCOSE, CAPILLARY: Glucose-Capillary: 106 mg/dL — ABNORMAL HIGH (ref 70–99)

## 2023-05-30 LAB — MAGNESIUM: Magnesium: 2 mg/dL (ref 1.7–2.4)

## 2023-05-30 LAB — PHOSPHORUS: Phosphorus: 3.7 mg/dL (ref 2.5–4.6)

## 2023-05-30 LAB — HEPARIN LEVEL (UNFRACTIONATED): Heparin Unfractionated: 0.38 [IU]/mL (ref 0.30–0.70)

## 2023-05-30 LAB — FERRITIN: Ferritin: 211 ng/mL (ref 24–336)

## 2023-05-30 MED ORDER — AMIODARONE HCL 200 MG PO TABS
400.0000 mg | ORAL_TABLET | Freq: Two times a day (BID) | ORAL | Status: DC
  Administered 2023-05-30 – 2023-05-31 (×3): 400 mg via ORAL
  Filled 2023-05-30 (×3): qty 2

## 2023-05-30 MED ORDER — VITAMIN B-12 1000 MCG PO TABS
1000.0000 ug | ORAL_TABLET | Freq: Every day | ORAL | Status: DC
  Administered 2023-05-31: 1000 ug via ORAL
  Filled 2023-05-30: qty 1

## 2023-05-30 MED ORDER — CYANOCOBALAMIN 1000 MCG/ML IJ SOLN
1000.0000 ug | Freq: Once | INTRAMUSCULAR | Status: AC
  Administered 2023-05-30: 1000 ug via INTRAMUSCULAR
  Filled 2023-05-30: qty 1

## 2023-05-30 NOTE — Plan of Care (Signed)
Problem: Clinical Measurements: Goal: Ability to maintain clinical measurements within normal limits will improve Outcome: Progressing Goal: Respiratory complications will improve Outcome: Progressing Goal: Cardiovascular complication will be avoided Outcome: Progressing

## 2023-05-30 NOTE — Progress Notes (Signed)
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 He presented with syncopal episode after standing and had prodrome of dizziness.  911 was called and he was found to be bradycardic with a heart rate in the 30s.  His pacemaker was interrogated and patient was found to have sustained V. tach noted for 6 minutes.  Subsequently was transferred to [facility] for a EP cardiology consult underwent a heart catheterization for further ischemic workup and current plan is for device upgrade  Assessment and Plan:  Syncope with collapse secondary to sustained VT -Medical cardiology and EP cardiology consulted -Currently on a Amiodarone drip will continue monitor on telemetry; Per Cariology they will plan for transition to po  tomorrow -Continue with metoprolol succinate 50 mg p.o. daily -Echo showed LVEF of 55-60% -Medical Cardiology and EP physician following and currently patient will be undergoing a removal of the RV paced sensing lead and placement of ICD lead for his Abbott dual-chamber pacemaker and the timing bit as per cardiology likely will be Wednesday, 05/31/2023.  Ventricular Tachycardia -Had 6 minutes of ventricular tachycardia on device interrogation -Carvedilol 12.5 mg twice daily and to metoprolol succinate 50 mg p.o. daily -EP cardiologist on board -Patient currently on amiodarone drip -Cardiology consulted and they feel that advanced imaging would be beneficial and recommending MRI but they are recommending to wait until after his current, mixed system was exchanged for a single vendor ICD system; now cardiology deciding to only add an HV lead  with leaving the existing RV pacing wire in place and there we will plan MRI at another facility -Cardiology team discussed and the timing of device Upgrade to be done tomorrow   Paroxysmal atrial fibrillation -Currently on heparin drip and plan is to stop at on 05/31/2023 at 12:01 AM -BB as above -Continue to Monitor on Telmetry   Elevated high-sensitivity troponin -Mildly elevated troponin secondary to possibly uncontrolled hypertension -Troponin went from 48 -> 39 -Cardiology on board -Patient underwent cardiac catheterization on 05/26/2023 that showed nonobstructive CAD and Normal EF on ECHO   Essential Hypertension -Continue Amlodipine 2.5 mg p.o. daily and Metoprolol Succinate 50 mg p.o. daily -Patient has IV Metropol tartrate 5 mg every 4 as needed for high blood pressure for systolic blood pressure greater than 80 or heart rate greater than 120 -Continue monitor blood pressures per protocol -Last blood pressure reading was 128/87  Metabolic Acidosis -Metabolic Acidosis is improved and now has a CO2 of 23, anion gap of 12, chloride level of 96 -Continue to monitor and trend and repeat CMP in a.m.   Dyslipidemia -Continue Statin therapy with Rosuvastatin 5 mg every 48 hours  Hyponatremia, relatively stable -Na+ Trend: Recent Labs  Lab 05/24/23 2126 05/25/23 1050 05/26/23 0245 05/27/23 0835 05/28/23 0232 05/29/23 0246 05/30/23 0247  NA 135 132* 133* 131* 132* 132* 131*  -Continue to Monitor and Trend and repeat CMP in the AM  Normocytic Anemia -Hgb/Hct Trend: Recent Labs  Lab 05/24/23 2126 05/25/23 1050 05/26/23 0245 05/28/23 0232 05/29/23 0246 05/30/23 0247  HGB 14.2 12.4* 13.2 12.8* 13.2 12.9*  HCT 41.8 36.2* 37.4* 36.5* 37.7* 36.0*  MCV 96.8 95.5 93.7 94.1 94.5 92.1  -Checked  Anemia Panel and showed an iron level of 41, UIBC 197, TIBC of 238, saturation ratios of 17%, ferritin level 211, folate level of 9.0 and a vitamin B12 level 107 -Will start vitamin  B12 supplementation -Continue to Monitor for S/Sx of Bleeding; No overt bleeding noted -Repeat CBC in the AM  Hypoalbuminemia -Patient's Albumin Trend: Recent Labs  Lab 05/24/23 2126 05/25/23 1050 05/26/23 0245 05/29/23 0246 05/30/23 0247  ALBUMIN 3.2* 2.9* 2.7* 2.5* 2.5*  -Continue to Monitor and Trend and repeat CMP in the AM   DVT prophylaxis: Anticoagulated on Heparin gtt    Code Status: Full Code Family Communication: No family present at bedside   Disposition Plan:  Level of care: Telemetry Cardiac Status is: Inpatient Remains inpatient appropriate because: Needs further Cardiology Clearance   Consultants:  Medical Cardiology EP Cardiology  Procedures:  ECHOCARDIOGRAM IMPRESSIONS     1. Left ventricular ejection fraction, by estimation, is 55 to 60%. Left  ventricular ejection fraction by 2D MOD biplane is 54.1 %. The left  ventricle has normal function. The left ventricle demonstrates regional  wall motion abnormalities (septal wall  motion consistent with conduction abnormality). Left ventricular diastolic  parameters are consistent with Grade I diastolic dysfunction (impaired  relaxation).   2. Right ventricular systolic function is normal. The right ventricular  size is normal. There is mildly elevated pulmonary artery systolic  pressure. The estimated right ventricular systolic pressure is 38.5 mmHg.   3. The mitral valve is normal in structure. Mild mitral valve  regurgitation. No evidence of mitral stenosis.   4. The aortic valve is normal in structure. Aortic valve regurgitation is  mild. No aortic stenosis is present.   5. The inferior vena cava is normal in size with greater than 50%  respiratory variability, suggesting right atrial pressure of 3 mmHg.   6. Frequent PVCs noted.   FINDINGS   Left Ventricle: Left ventricular ejection fraction, by estimation, is 55  to 60%. Left ventricular ejection fraction by 2D MOD biplane is 54.1 %.  The left  ventricle has normal function. The left ventricle demonstrates  regional wall motion abnormalities.  The left ventricular internal cavity size was normal in size. There is no  left ventricular hypertrophy. Left ventricular diastolic parameters are  consistent with Grade I diastolic dysfunction (impaired relaxation).   Right Ventricle: The right ventricular size is normal. No increase in  right ventricular wall thickness. Right ventricular systolic function is  normal. There is mildly elevated pulmonary artery systolic pressure. The  tricuspid regurgitant velocity is 2.76   m/s, and with an assumed right atrial pressure of 8 mmHg, the estimated  right ventricular systolic pressure is 38.5 mmHg.   Left Atrium: Left atrial size was normal in size.   Right Atrium: Right atrial size was normal in size.   Pericardium: There is no evidence of pericardial effusion.   Mitral Valve: The mitral valve is normal in structure. Mild mitral valve  regurgitation. No evidence of mitral valve stenosis. MV peak gradient, 3.6  mmHg. The mean mitral valve gradient is 2.0 mmHg.   Tricuspid Valve: The tricuspid valve is normal in structure. Tricuspid  valve regurgitation is mild . No evidence of tricuspid stenosis.   Aortic Valve: The aortic valve is normal in structure. Aortic valve  regurgitation is mild. No aortic stenosis is present. Aortic valve mean  gradient measures 4.0 mmHg. Aortic valve peak gradient measures 6.9 mmHg.  Aortic valve area, by VTI measures 2.57  cm.  Pulmonic Valve: The pulmonic valve was normal in structure. Pulmonic valve  regurgitation is not visualized. No evidence of pulmonic stenosis.   Aorta: The aortic root is normal in size and structure.   Venous: The inferior vena cava is normal in size with greater than 50%  respiratory variability, suggesting right atrial pressure of 3 mmHg.   IAS/Shunts: No atrial level shunt detected by color flow Doppler.     LEFT  VENTRICLE  PLAX 2D                        Biplane EF (MOD)  LVIDd:         5.60 cm         LV Biplane EF:   Left  LVIDs:         3.80 cm                          ventricular  LV PW:         1.00 cm                          ejection  LV IVS:        1.60 cm                          fraction by  LVOT diam:     2.00 cm                          2D MOD  LV SV:         75                               biplane is  LV SV Index:   39                               54.1 %.  LVOT Area:     3.14 cm                                 Diastology                                 LV e' medial:    4.54 cm/s  LV Volumes (MOD)               LV E/e' medial:  14.4  LV vol d, MOD    87.7 ml       LV e' lateral:   7.22 cm/s  A2C:                           LV E/e' lateral: 9.1  LV vol d, MOD    83.3 ml  A4C:  LV vol s, MOD    35.9 ml  A2C:  LV vol s, MOD    39.7 ml  A4C:  LV SV MOD A2C:   51.8 ml  LV SV MOD A4C:   83.3 ml  LV SV MOD BP:    46.8 ml   RIGHT VENTRICLE  RV Basal diam:  3.35 cm  RV Mid diam:    3.00 cm  RV S prime:     11.70 cm/s  TAPSE (M-mode): 2.6 cm   LEFT ATRIUM             Index        RIGHT ATRIUM           Index  LA diam:        4.40 cm 2.26 cm/m   RA Area:     19.70 cm  LA Vol (A2C):   63.2 ml 32.52 ml/m  RA Volume:   52.70 ml  27.12 ml/m  LA Vol (A4C):   58.5 ml 30.11 ml/m  LA Biplane Vol: 63.1 ml 32.47 ml/m   AORTIC VALVE                    PULMONIC VALVE  AV Area (Vmax):    2.42 cm     PV Vmax:       0.83 m/s  AV Area (Vmean):   2.12 cm     PV Peak grad:  2.8 mmHg  AV Area (VTI):     2.57 cm  AV Vmax:           131.00 cm/s  AV Vmean:          91.100 cm/s  AV VTI:            0.293 m  AV Peak Grad:      6.9 mmHg  AV Mean Grad:      4.0 mmHg  LVOT Vmax:         101.00 cm/s  LVOT Vmean:        61.400 cm/s  LVOT VTI:          0.240 m  LVOT/AV VTI ratio: 0.82    AORTA  Ao Root diam: 4.00 cm   MITRAL VALVE               TRICUSPID VALVE  MV Area (PHT): 2.22 cm    TR  Peak grad:   30.5 mmHg  MV Area VTI:   2.63 cm    TR Vmax:        276.00 cm/s  MV Peak grad:  3.6 mmHg  MV Mean grad:  2.0 mmHg    SHUNTS  MV Vmax:       0.95 m/s    Systemic VTI:  0.24 m  MV Vmean:      58.7 cm/s   Systemic Diam: 2.00 cm  MV Decel Time: 342 msec  MV E velocity: 65.60 cm/s  MV A velocity: 88.70 cm/s  MV E/A ratio:  0.74    CARDIAC CATHETERIZATION   2nd Mrg lesion is 40% stenosed.   RPDA lesion is 20% stenosed.   Left Heart Catheterization 05/26/23: Hemodynamic data: LVEDP 6 mmHg.  There is no pressure gradient across the aortic valve.   Angiographic data: LM: Large-caliber vessel.  Smooth and normal. LAD: Very large caliber vessel.  Moderate to severe periarterial calcification noted in the proximal and mid LAD.  Gives origin to 3 small to moderate-sized diagonals.  There is mild luminal irregularity in the LAD.  LAD ends at the apex. LCx: Is a large-caliber vessel.  Gives origin to large OM 2 and continues as a OM 3, 40% stenosis in proximal OM1 and a 30% stenosis in mid CX distal to OM1. RCA: Very large caliber vessel, there is proximal 20% stenosis and PDA has mild 20%  stenosis.  Otherwise there is mild luminal irregularity.      Impression and recommendations: No significant coronary disease by cardiac catheterization.  Normal EDP.  Antimicrobials:  Anti-infectives (From admission, onward)    None       Subjective: Seen and examined at bedside and was doing okay.  Denies chest pain or shortness of breath.  No nausea or vomiting.  Feels okay.  No other concerns or complaints at this time.  Objective: Vitals:   05/30/23 0425 05/30/23 0829 05/30/23 0849 05/30/23 1133  BP: 112/66 (!) 143/71 116/66 128/87  Pulse: (!) 58 60 60 60  Resp: 18 18 18 16   Temp: 97.8 F (36.6 C) 97.8 F (36.6 C)  98.6 F (37 C)  TempSrc: Oral Oral  Oral  SpO2: 93% 98%  95%  Weight: 74.8 kg     Height:        Intake/Output Summary (Last 24 hours) at 05/30/2023 1526 Last  data filed at 05/30/2023 1300 Gross per 24 hour  Intake 177 ml  Output 445 ml  Net -268 ml   Filed Weights   05/28/23 0443 05/29/23 0425 05/30/23 0425  Weight: 74.3 kg 70.3 kg 74.8 kg   Examination: Physical Exam:  Constitutional: Elderly Caucasian male in no acute distress Respiratory: Diminished to auscultation bilaterally, no wheezing, rales, rhonchi or crackles. Normal respiratory effort and patient is not tachypenic. No accessory muscle use.  Unlabored breathing Cardiovascular: RRR, no murmurs / rubs / gallops. S1 and S2 auscultated. No extremity edema. Abdomen: Soft, non-tender, non-distended. Bowel sounds positive.  GU: Deferred. Musculoskeletal: No clubbing / cyanosis of digits/nails. No joint deformity upper and lower extremities.  Skin: No rashes, lesions, ulcers on a limited skin evaluation. No induration; Warm and dry.  Neurologic: CN 2-12 grossly intact with no focal deficits. Romberg sign and cerebellar reflexes not assessed.  Psychiatric: Normal judgment and insight. Alert and oriented x 3. Normal mood and appropriate affect.   Data Reviewed: I have personally reviewed following labs and imaging studies  CBC: Recent Labs  Lab 05/24/23 2126 05/25/23 1050 05/26/23 0245 05/28/23 0232 05/29/23 0246 05/30/23 0247  WBC 6.7 7.2 7.3 5.0 4.8 6.6  NEUTROABS 4.0 5.1 5.0  --  2.6 4.1  HGB 14.2 12.4* 13.2 12.8* 13.2 12.9*  HCT 41.8 36.2* 37.4* 36.5* 37.7* 36.0*  MCV 96.8 95.5 93.7 94.1 94.5 92.1  PLT 148* 146* 145* 160 158 188   Basic Metabolic Panel: Recent Labs  Lab 05/24/23 2126 05/25/23 1050 05/26/23 0245 05/27/23 0835 05/28/23 0232 05/29/23 0246 05/30/23 0247  NA 135 132* 133* 131* 132* 132* 131*  K 3.9 4.2 4.1 3.8 4.1 4.0 3.8  CL 94* 97* 98 97* 97* 97* 96*  CO2 25 24 24 23 24  21* 23  GLUCOSE 116* 108* 110* 179* 109* 101* 103*  BUN 22 19 14 16 12 13 15   CREATININE 1.29* 0.96 1.08 1.13 1.00 1.08 1.02  CALCIUM 8.7* 8.6* 8.7* 8.7* 8.7* 8.5* 8.8*  MG 2.2  2.2 2.0  --   --  2.1 2.0  PHOS  --  3.2  --   --   --  3.9 3.7   GFR: Estimated Creatinine Clearance: 58.7 mL/min (by C-G formula based on SCr of 1.02 mg/dL). Liver Function Tests: Recent Labs  Lab 05/24/23 2126 05/25/23 1050 05/26/23 0245 05/29/23 0246 05/30/23 0247  AST 18 17 14* 22 18  ALT 14 13 12 14 17   ALKPHOS 52 34* 37* 37* 32*  BILITOT 0.9 1.3* 1.5*  0.9 1.0  PROT 7.8 7.0 7.6 7.4 7.4  ALBUMIN 3.2* 2.9* 2.7* 2.5* 2.5*   No results for input(s): "LIPASE", "AMYLASE" in the last 168 hours. No results for input(s): "AMMONIA" in the last 168 hours. Coagulation Profile: Recent Labs  Lab 05/25/23 1734  INR 1.4*   Cardiac Enzymes: No results for input(s): "CKTOTAL", "CKMB", "CKMBINDEX", "TROPONINI" in the last 168 hours. BNP (last 3 results) No results for input(s): "PROBNP" in the last 8760 hours. HbA1C: No results for input(s): "HGBA1C" in the last 72 hours. CBG: Recent Labs  Lab 05/24/23 2117 05/29/23 2123 05/30/23 0600  GLUCAP 140* 117* 106*   Lipid Profile: No results for input(s): "CHOL", "HDL", "LDLCALC", "TRIG", "CHOLHDL", "LDLDIRECT" in the last 72 hours. Thyroid Function Tests: No results for input(s): "TSH", "T4TOTAL", "FREET4", "T3FREE", "THYROIDAB" in the last 72 hours. Anemia Panel: Recent Labs    05/29/23 1420  VITAMINB12 107*  FOLATE 9.0  FERRITIN 211  TIBC 238*  IRON 41*  RETICCTPCT 1.3   Sepsis Labs: No results for input(s): "PROCALCITON", "LATICACIDVEN" in the last 168 hours.  Recent Results (from the past 240 hours)  Surgical PCR screen     Status: None   Collection Time: 05/29/23  3:00 PM   Specimen: Nasal Mucosa; Nasal Swab  Result Value Ref Range Status   MRSA, PCR NEGATIVE NEGATIVE Final   Staphylococcus aureus NEGATIVE NEGATIVE Final    Comment: (NOTE) The Xpert SA Assay (FDA approved for NASAL specimens in patients 102 years of age and older), is one component of a comprehensive surveillance program. It is not intended to  diagnose infection nor to guide or monitor treatment. Performed at [facility], [address] [place], [place] [address]     Radiology Studies: No results found.  Scheduled Meds:  amiodarone  400 mg Oral BID   amLODipine  2.5 mg Oral Daily   cyanocobalamin  1,000 mcg Intramuscular Once   [START ON 05/31/2023] vitamin B-12  1,000 mcg Oral Daily   metoprolol succinate  50 mg Oral Daily   rosuvastatin  5 mg Oral Q48H   Continuous Infusions:  heparin 1,150 Units/hr (05/29/23 1654)    LOS: 5 days   Marguerita Merles, DO [facility] Available via Epic secure chat 7am-7pm After these hours, please refer to coverage provider listed on [email or web address] 05/30/2023, 3:26 PM

## 2023-05-30 NOTE — TOC Progression Note (Signed)
Transition of Care [facility]) - Progression Note    Patient Details  Name: Thomas [name] MRN: [id number] Date of Birth: 22-Apr-1946  [facility]) CM/SW Contact  Leone Haven, RN Phone Number: 05/30/2023, 4:58 PM  Clinical Narrative:    Per progression information still conts on hep drip, upgrade service canceled, continue to monitor.  TOC following.    Expected Discharge Plan: Home/Self Care Barriers to Discharge: Continued Medical Work up  Expected Discharge Plan and Services In-house Referral: NA Discharge Planning Services: CM Consult Post Acute Care Choice: NA Living arrangements for the past 2 months: Single Family Home                 DME Arranged: N/A DME Agency: NA       HH Arranged: NA           Social Determinants of Health (SDOH) Interventions SDOH Screenings   Food Insecurity: No Food Insecurity (05/25/2023)  Housing: Low Risk  (05/25/2023)  Transportation Needs: No Transportation Needs (05/25/2023)  Utilities: Not At Risk (05/25/2023)  Alcohol Screen: Low Risk  (02/16/2023)  Depression (PHQ2-9): Low Risk  (04/03/2023)  Financial Resource Strain: Low Risk  (02/16/2023)  Physical Activity: Sufficiently Active (02/16/2023)  Social Connections: Moderately Integrated (05/25/2023)  Stress: No Stress Concern Present (02/16/2023)  Tobacco Use: Medium Risk (05/25/2023)  Health Literacy: Adequate Health Literacy (02/16/2023)    Readmission Risk Interventions     No data to display

## 2023-05-30 NOTE — Progress Notes (Addendum)
   05/30/23 1126  Infiltration/Extravasation/Phlebitis Assessment  Site LAC  Date Pharmacy Contacted 05/30/23 (spoke to [facility] Associate Professor)  Time Pharmacy Contacted [phone]  Infiltration/Extravasation Grading Scale 1   Infiltration of amiodarone gtt infusing in LAC. NT removed PIV. EP at bedside and made aware. Amiodarone gtt changed to PO amiodarone. Pharmacy notified. Site marked. Elevation and heat packs on site. Patient educated and heat pack applied.
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
## 2023-05-31 ENCOUNTER — Other Ambulatory Visit (HOSPITAL_COMMUNITY): Payer: Self-pay

## 2023-05-31 ENCOUNTER — Telehealth: Payer: Self-pay | Admitting: Physician Assistant

## 2023-05-31 DIAGNOSIS — R55 Syncope and collapse: Secondary | ICD-10-CM | POA: Diagnosis not present

## 2023-05-31 LAB — CBC WITH DIFFERENTIAL/PLATELET
Abs Immature Granulocytes: 0.02 10*3/uL (ref 0.00–0.07)
Basophils Absolute: 0 10*3/uL (ref 0.0–0.1)
Basophils Relative: 0 %
Eosinophils Absolute: 0.3 10*3/uL (ref 0.0–0.5)
Eosinophils Relative: 4 %
HCT: 35.6 % — ABNORMAL LOW (ref 39.0–52.0)
Hemoglobin: 12.6 g/dL — ABNORMAL LOW (ref 13.0–17.0)
Immature Granulocytes: 0 %
Lymphocytes Relative: 24 %
Lymphs Abs: 1.6 10*3/uL (ref 0.7–4.0)
MCH: 32.8 pg (ref 26.0–34.0)
MCHC: 35.4 g/dL (ref 30.0–36.0)
MCV: 92.7 fL (ref 80.0–100.0)
Monocytes Absolute: 0.5 10*3/uL (ref 0.1–1.0)
Monocytes Relative: 7 %
Neutro Abs: 4.2 10*3/uL (ref 1.7–7.7)
Neutrophils Relative %: 65 %
Platelets: 206 10*3/uL (ref 150–400)
RBC: 3.84 MIL/uL — ABNORMAL LOW (ref 4.22–5.81)
RDW: 13.3 % (ref 11.5–15.5)
WBC: 6.5 10*3/uL (ref 4.0–10.5)
nRBC: 0 % (ref 0.0–0.2)

## 2023-05-31 LAB — MISC LABCORP TEST (SEND OUT)
Labcorp test code: 1503
Labcorp test code: 1321
Labcorp test code: 4598

## 2023-05-31 LAB — COMPREHENSIVE METABOLIC PANEL
ALT: 18 U/L (ref 0–44)
AST: 22 U/L (ref 15–41)
Albumin: 2.5 g/dL — ABNORMAL LOW (ref 3.5–5.0)
Alkaline Phosphatase: 36 U/L — ABNORMAL LOW (ref 38–126)
Anion gap: 13 (ref 5–15)
BUN: 14 mg/dL (ref 8–23)
CO2: 22 mmol/L (ref 22–32)
Calcium: 8.7 mg/dL — ABNORMAL LOW (ref 8.9–10.3)
Chloride: 94 mmol/L — ABNORMAL LOW (ref 98–111)
Creatinine, Ser: 1.09 mg/dL (ref 0.61–1.24)
GFR, Estimated: >60 mL/min (ref >60)
Glucose, Bld: 103 mg/dL — ABNORMAL HIGH (ref 70–99)
Potassium: 3.9 mmol/L (ref 3.5–5.1)
Sodium: 129 mmol/L — ABNORMAL LOW (ref 135–145)
Total Bilirubin: 1.2 mg/dL (ref 0.0–1.2)
Total Protein: 7.7 g/dL (ref 6.5–8.1)

## 2023-05-31 LAB — MAGNESIUM: Magnesium: 2.1 mg/dL (ref 1.7–2.4)

## 2023-05-31 LAB — PHOSPHORUS: Phosphorus: 3.7 mg/dL (ref 2.5–4.6)

## 2023-05-31 LAB — HEPARIN LEVEL (UNFRACTIONATED): Heparin Unfractionated: 0.23 [IU]/mL — ABNORMAL LOW (ref 0.30–0.70)

## 2023-05-31 SURGERY — ICD IMPLANT

## 2023-05-31 MED ORDER — AMIODARONE HCL 200 MG PO TABS
ORAL_TABLET | ORAL | 0 refills | Status: DC
  Filled 2023-05-31: qty 55, 34d supply, fill #0

## 2023-05-31 MED ORDER — METOPROLOL SUCCINATE ER 50 MG PO TB24
50.0000 mg | ORAL_TABLET | Freq: Every day | ORAL | 0 refills | Status: DC
  Filled 2023-05-31: qty 30, 30d supply, fill #0

## 2023-05-31 MED ORDER — APIXABAN 5 MG PO TABS
5.0000 mg | ORAL_TABLET | Freq: Two times a day (BID) | ORAL | Status: DC
  Administered 2023-05-31: 5 mg via ORAL
  Filled 2023-05-31: qty 1

## 2023-05-31 NOTE — Progress Notes (Signed)
Left forearm iv dcd. Site unremarkable. Ccmd notified of dc order.

## 2023-05-31 NOTE — Progress Notes (Addendum)
Received notification from nurse that patient and family had disagreement with [employer] rep. They were upset with having to wait all day and then felt the [employer] explanation was rushed. They felt this was a poor interaction and they could not comprehend the directions in the speed or manner they were presented. Wife was in tears, then Mr. Hedding became upset. He then refused to place [employer] and wants to leave. I spoke with Mr. Welden, his wife, and their family friend Altamease Oiler. They are all in agreement he wants to leave without [employer] and acknowledge my advisement of the risk of fatal cardiac arrhythmia without this in place. He refuses to remain in the hospital any longer. [employer] rep Glendon Axe had given them her phone number but Altamease Oiler states she does not think that [place] or [facility] will call her anytime soon. I told them I will forward this update to our EP APP Brandi and Dr. Graciela Husbands to advise on options for placing in the ambulatory setting -> have sent this in phone note format. They would be open to revisiting as long as explanation was thorough and not rushed.  Dr. Jacqulyn Bath updated as well.

## 2023-05-31 NOTE — Care Management Important Message (Signed)
Important Message  Patient Details  Name: [name] MRN: [id number] Date of Birth: [birth year]   Important Message Given:  Yes - Medicare IM     Renie Ora 05/31/2023, 10:52 AM

## 2023-05-31 NOTE — Telephone Encounter (Addendum)
Received notification this evening from nurse that patient and family had disagreement with [employer] rep. They were upset with having to wait all day and then felt the [employer] explanation was rushed. They felt this was a poor interaction and they could not comprehend the directions in the speed or manner they were presented. Wife was in tears, then Thomas [name] became upset. He then refused to place [employer] and wants to leave. I spoke with Thomas [name], his wife, and their family friend Thomas [name]. They are all in agreement he wants to leave without [employer] and acknowledge my advisement of the risk of fatal cardiac arrhythmia without this in place. He refuses to remain in the hospital any longer. [employer] rep Thomas [name] had given them her phone number but Thomas [name] states she does not think that Thomas [name] or Thomas [name] will call her anytime soon. I told them I will forward this update to our EP APP Thomas [name] and Thomas [name] to advise on options for placing in the ambulatory setting. They would be open to revisiting as long as explanation was thorough and not rushed. Dr. Jacqulyn Bath updated as well. I also left this information in a progress note in the hospitalized encounter.

## 2023-05-31 NOTE — Progress Notes (Signed)
Patient still waiting for life vest, spouse at bedside. No ETA for the life vest. Patient anxious to go home, but understands the importance of being fitted with a life vest prior to discharge. Elnita Maxwell, RN

## 2023-05-31 NOTE — TOC Progression Note (Addendum)
Transition of Care [facility]) - Progression Note    Patient Details  Name: Thomas [name] MRN: [id number] Date of Birth: 21-Sep-1945  Transition [facility]) CM/SW Contact  Lawerance Sabal, RN Phone Number: 05/31/2023, 9:59 AM  Clinical Narrative:     Sent all needed paperwork for Life Vest to [employer] rep   Discussed with nurse in progression rounds that patient may not go to discharge lounge until Life Vest has been delivered  Expected Discharge Plan: Home/Self Care Barriers to Discharge: Continued Medical Work up  Expected Discharge Plan and Services In-house Referral: NA Discharge Planning Services: CM Consult Post Acute Care Choice: NA Living arrangements for the past 2 months: Single Family Home                 DME Arranged: N/A DME Agency: NA       HH Arranged: NA           Social Determinants of Health (SDOH) Interventions SDOH Screenings   Food Insecurity: No Food Insecurity (05/25/2023)  Housing: Low Risk  (05/25/2023)  Transportation Needs: No Transportation Needs (05/25/2023)  Utilities: Not At Risk (05/25/2023)  Alcohol Screen: Low Risk  (02/16/2023)  Depression (PHQ2-9): Low Risk  (04/03/2023)  Financial Resource Strain: Low Risk  (02/16/2023)  Physical Activity: Sufficiently Active (02/16/2023)  Social Connections: Moderately Integrated (05/25/2023)  Stress: No Stress Concern Present (02/16/2023)  Tobacco Use: Medium Risk (05/25/2023)  Health Literacy: Adequate Health Literacy (02/16/2023)    Readmission Risk Interventions     No data to display

## 2023-05-31 NOTE — Plan of Care (Signed)

## 2023-05-31 NOTE — Progress Notes (Signed)
OT Cancellation Note  Patient Details Name: Thomas [name] MRN: [id number] DOB: [birth year]   Cancelled Treatment:    Reason Eval/Treat Not Completed: OT screened, no needs identified, will sign off- spoke to PT, no OT needs identified at this time.   Barry Brunner, OT Acute Rehabilitation Services Office [phone]   Chancy Milroy 05/31/2023, 10:04 AM

## 2023-05-31 NOTE — Plan of Care (Signed)
Problem: Education: Goal: Knowledge of General Education information will improve Description Including pain rating scale, medication(s)/side effects and non-pharmacologic comfort measures Outcome: Progressing   Problem: Health Behavior/Discharge Planning: Goal: Ability to manage health-related needs will improve Outcome: Progressing

## 2023-05-31 NOTE — Evaluation (Signed)
Physical Therapy Evaluation Patient Details Name: Thomas [name] MRN: [id number] DOB: October 28, 1945 Today's Date: 05/31/2023  History of Present Illness  Patient is 78 y.o. male presented 05/24/23 with syncopal episode after standing and had prodrome of dizziness. EMS found pt bradycardic with a heart rate in the 30s.  His pacemaker was interrogated and patient was found to have sustained V. tach noted for 6 minutes. PMH significant for HTN, HLD, PAF, high-grade AV block s/p PPM, BPH.   Clinical Impression  Thomas [name] is 78 y.o. male admitted with above HPI and diagnosis. Patient is currently limited by functional impairments below (see PT problem list). Patient lives with spouse and is independent at baseline. Patient evaluated by Physical Therapy with no further acute PT needs identified. All education has been completed and the patient has no further questions. Pt is mobilizing at Mod Ind level for bed mob and transfers, and supervision for safety with gait. HR stable to 60 bpm, (paced). See below for any follow-up Physical Therapy or equipment needs. PT is signing off. Thank you for this referral.   Orthostatic VS for the past 24 hrs:  BP- Lying Pulse- Lying BP- Sitting Pulse- Sitting BP- Standing at 0 minutes Pulse- Standing at 0 minutes BP- Standing at 3 minutes Pulse- Standing at 3 minutes  05/31/23 0939 156/73 60 (!) 152/99 60 (!) 133/96 60 137/79 60         If plan is discharge home, recommend the following: Assist for transportation;Help with stairs or ramp for entrance   Can travel by private vehicle        Equipment Recommendations None recommended by PT (pt safe to mobilize with RN/NT and MS team during acute stay)  Recommendations for Other Services       Functional Status Assessment Patient has had a recent decline in their functional status and demonstrates the ability to make significant improvements in function in a reasonable and predictable amount of time.      Precautions / Restrictions Precautions Precautions: Fall Restrictions Weight Bearing Restrictions Per Provider Order: No      Mobility  Bed Mobility Overal bed mobility: Modified Independent Bed Mobility: Supine to Sit     Supine to sit: HOB elevated, Modified independent (Device/Increase time)          Transfers Overall transfer level: Modified independent Equipment used: None Transfers: Sit to/from Stand Sit to Stand: Modified independent (Device/Increase time)           General transfer comment: use of hands to rise and lower EOB and recliner    Ambulation/Gait Ambulation/Gait assistance: Supervision Gait Distance (Feet): 300 Feet Assistive device: None Gait Pattern/deviations: Step-through pattern, Decreased stride length Gait velocity: decr/fair     General Gait Details: overall steady gait, slightly wide BOS to increase stability. no buckling, drift or LOB  noted. HR stable at 60 bpm.  Stairs            Wheelchair Mobility     Tilt Bed    Modified Rankin (Stroke Patients Only)       Balance Overall balance assessment: Needs assistance Sitting-balance support: Feet supported Sitting balance-Leahy Scale: Good     Standing balance support: During functional activity, No upper extremity supported Standing balance-Leahy Scale: Good                               Pertinent Vitals/Pain Pain Assessment Pain Assessment: No/denies pain  Home Living Family/patient expects to be discharged to:: Private residence Living Arrangements: Spouse/significant other Available Help at Discharge: Family Type of Home: House Home Access: Stairs to enter Entrance Stairs-Rails: Left Entrance Stairs-Number of Steps: 2+1   Home Layout: One level;Laundry or work area in [employer]: None Additional Comments: likes to fish but his fishing partner has been ill and not recovered. yard work. son in [place] and dtr in [place] an  dgrandson close by    Prior Function Prior Level of Function : Independent/Modified Independent;Driving                     Extremity/Trunk Assessment   Upper Extremity Assessment Upper Extremity Assessment: Overall WFL for tasks assessed    Lower Extremity Assessment Lower Extremity Assessment: Overall WFL for tasks assessed    Cervical / Trunk Assessment Cervical / Trunk Assessment: Normal (slightly kyphotic)  Communication   Communication Communication: No apparent difficulties  Cognition Arousal: Alert Behavior During Therapy: WFL for tasks assessed/performed Overall Cognitive Status: Within Functional Limits for tasks assessed                                          General Comments      Exercises     Assessment/Plan    PT Assessment Patient needs continued PT services  PT Problem List Decreased activity tolerance;Decreased mobility;Decreased knowledge of use of DME;Cardiopulmonary status limiting activity       PT Treatment Interventions DME instruction;Gait training;Stair training;Functional mobility training;Therapeutic activities;Therapeutic exercise;Balance training;Neuromuscular re-education;Patient/family education    PT Goals (Current goals can be found in the Care Plan section)  Acute Rehab PT Goals Patient Stated Goal: medically recover and return home PT Goal Formulation: All assessment and education complete, DC therapy Time For Goal Achievement: 06/01/23 Potential to Achieve Goals: Good    Frequency Min 1X/week     Co-evaluation               AM-PAC PT "6 Clicks" Mobility  Outcome Measure Help needed turning from your back to your side while in a flat bed without using bedrails?: None Help needed moving from lying on your back to sitting on the side of a flat bed without using bedrails?: None Help needed moving to and from a bed to a chair (including a wheelchair)?: None Help needed standing up from a chair  using your arms (e.g., wheelchair or bedside chair)?: None Help needed to walk in hospital room?: A Little Help needed climbing 3-5 steps with a railing? : A Little 6 Click Score: 22    End of Session Equipment Utilized During Treatment: Gait belt Activity Tolerance: Patient tolerated treatment well Patient left: in chair;with call bell/phone within reach;with family/visitor present Nurse Communication: Mobility status PT Visit Diagnosis: Difficulty in walking, not elsewhere classified (R26.2);Dizziness and giddiness (R42)    Time: 1610-9604 PT Time Calculation (min) (ACUTE ONLY): 23 min   Charges:   PT Evaluation $PT Eval Low Complexity: 1 Low PT Treatments $Gait Training: 8-22 mins PT General Charges $$ ACUTE PT VISIT: 1 Visit         Wynn Maudlin, DPT Acute Rehabilitation Services Office [phone]  05/31/23 10:11 AM

## 2023-05-31 NOTE — Discharge Summary (Signed)
Physician Discharge Summary  SELMER ADDUCI HQI:[id number] DOB: 14-Dec-1945 DOA: 05/25/2023  PCP: Smitty Cords, DO  Admit date: 05/25/2023 Discharge date: 05/31/2023 30 Day Unplanned Readmission Risk Score    Flowsheet Row Admission (Current) from 05/25/2023 in [facility] 3E HF PCU  30 Day Unplanned Readmission Risk Score (%) 14.57 Filed at 05/31/2023 0801       This score is the patient's risk of an unplanned readmission within 30 days of being discharged (0 -100%). The score is based on dignosis, age, lab data, medications, orders, and past utilization.   Low:  0-14.9   Medium: 15-21.9   High: 22-29.9   Extreme: 30 and above          Admitted From: Home Disposition: Home  Recommendations for Outpatient Follow-up:  Follow up with PCP in 1-2 weeks Please obtain BMP/CBC in one week Follow-up with cardiology, they will set up time and date Please follow up with your PCP on the following pending results: Unresulted Labs (From admission, onward)     Start     Ordered   05/30/23 0500  CBC  Daily,   R     Question:  Specimen collection method  Answer:  Lab=Lab collect   05/29/23 1027              Home Health: None Equipment/Devices: None but he is going home with a LifeVest  Discharge Condition: Stable CODE STATUS: Full code Diet recommendation: Cardiac  Subjective: Seen and examined, wife at the bedside.  Patient has no complaints.  He verbalized understanding the plan of discharge today.  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 He presented with syncopal episode after standing and had prodrome of dizziness.  911 was called and he was found to be bradycardic with a heart rate in the 30s.  His pacemaker was  interrogated and patient was found to have sustained V. tach noted for 6 minutes.  Subsequently was transferred to [facility] for a EP cardiology consult underwent a heart catheterization for further ischemic workup and current plan is for device upgrade   Syncope with collapse secondary to sustained VT -Had 6 minutes of ventricular tachycardia on device interrogation -Carvedilol 12.5 mg twice daily PTA but transition to Toprol-XL 50 p.o. daily.  EP cardiology on board.  Patient was started on amiodarone drip and transition to oral amiodarone 05/22/2023.Echo showed LVEF of 55-60%.  He also underwent cardiac catheterization on 05/26/2023 and was found to have no significant coronary artery disease with normal EDP. -Cardiology feel that advanced imaging would be beneficial and recommending cMRI as outpatient with his primary cardiologist at [facility].  Cardiology was initially considering upgrading his device but after discussing with patient, per cardiology note, "The patient is disinclined towards device upgrade. In the context of the AVID study, similar outcomes were noted in people with monomorphic ventricular tachycardia and reasonable ejection fraction is between amiodarone and an ICD. He would far prefer to take the amiodarone then to undergo device procedure" based on this, device was not upgraded, patient has been cleared to go home today, he will receive LifeVest prior to discharge and he will go home on amiodarone 400 mg p.o. twice daily followed by 200 mg p.o. daily.  Also resuming Eliquis.  Paroxysmal atrial fibrillation Being discharged on amiodarone  and Eliquis per cardiology recommendations.   Elevated high-sensitivity troponin -Mildly elevated troponin secondary to possibly uncontrolled hypertension -Troponin went from 48 -> 39 -Patient underwent cardiac catheterization on 05/26/2023 that showed nonobstructive CAD and Normal EF on ECHO   Essential Hypertension -Controlled, continue Amlodipine  2.5 mg p.o. daily and Metoprolol Succinate 50 mg p.o. daily  Metabolic Acidosis Resolved   Dyslipidemia -Continue Statin therapy with Rosuvastatin 5 mg every 48 hours   Hyponatremia: Resolved  Normocytic Anemia: Stable  Discharge plan was discussed with patient and/or family member and they verbalized understanding and agreed with it.  Discharge Diagnoses:  Principal Problem:   Syncope and collapse Active Problems:   Essential hypertension   Dyslipidemia   BPH (benign prostatic hyperplasia)   Anxiety   Atrial fibrillation, chronic (HCC)   Pacemaker   VT (ventricular tachycardia) (HCC)    Discharge Instructions   Allergies as of 05/31/2023       Reactions   Penicillins Itching, Swelling   Flexeril [cyclobenzaprine] Itching, Swelling   Proscar [finasteride] Other (See Comments)   dizziness   Diazepam    Metronidazole    Other reaction(s): Abdominal bloating, Stomach cramps, Abdominal bloating, Stomach cramps        Medication List     STOP taking these medications    carvedilol 12.5 MG tablet Commonly known as: COREG   diltiazem 30 MG tablet Commonly known as: Cardizem       TAKE these medications    acetaminophen 325 MG tablet Commonly known as: TYLENOL Take 1-2 tablets (325-650 mg total) by mouth every 4 (four) hours as needed for mild pain.   amiodarone 200 MG tablet Commonly known as: PACERONE Take 2 tablets (400 mg total) by mouth 2 (two) times daily for 7 days, THEN 1 tablet (200 mg total) daily for 27 days. Start taking on: May 31, 2023   amLODipine 2.5 MG tablet Commonly known as: NORVASC Take 1 tablet (2.5 mg total) by mouth daily. May take a second tablet (2.5 MG ) daily as needed for pressure greater then 150.   apixaban 5 MG Tabs tablet Commonly known as: Eliquis Take 1 tablet (5 mg total) by mouth 2 (two) times daily.   metoprolol succinate 50 MG 24 hr tablet Commonly known as: TOPROL-XL Take 1 tablet (50 mg total) by mouth  daily. Take with or immediately following a meal. Start taking on: June 01, 2023   rosuvastatin 5 MG tablet Commonly known as: Crestor Take 1 tablet (5 mg total) by mouth every other day. At bedtime.   Saw Palmetto 450 MG Caps Take 3 capsules by mouth daily.               Durable Medical Equipment  (From admission, onward)           Start     Ordered   05/31/23 1113  For home use only DME Vest life vest  Once       Comments: Sustained VT, 3 month duration for vest   05/31/23 1112            Follow-up Information     Smitty Cords, DO Follow up in 1 week(s).   Specialty: Family Medicine Contact information: [address] [place] [address] [phone]                Allergies  Allergen Reactions   Penicillins Itching and Swelling   Flexeril [Cyclobenzaprine] Itching and Swelling   Proscar [Finasteride] Other (See Comments)  dizziness   Diazepam    Metronidazole     Other reaction(s): Abdominal bloating, Stomach cramps, Abdominal bloating, Stomach cramps    Consultations: Cardiology   Procedures/Studies: CARDIAC CATHETERIZATION Result Date: 05/26/2023 Images from the original result were not included.   2nd Mrg lesion is 40% stenosed.   RPDA lesion is 20% stenosed. Left Heart Catheterization 05/26/23: Hemodynamic data: LVEDP 6 mmHg.  There is no pressure gradient across the aortic valve. Angiographic data: LM: Large-caliber vessel.  Smooth and normal. LAD: Very large caliber vessel.  Moderate to severe periarterial calcification noted in the proximal and mid LAD.  Gives origin to 3 small to moderate-sized diagonals.  There is mild luminal irregularity in the LAD.  LAD ends at the apex. LCx: Is a large-caliber vessel.  Gives origin to large OM 2 and continues as a OM 3, 40% stenosis in proximal OM1 and a 30% stenosis in mid CX distal to OM1. RCA: Very large caliber vessel, there is proximal 20% stenosis and PDA has mild 20% stenosis.   Otherwise there is mild luminal irregularity. Impression and recommendations: No significant coronary disease by cardiac catheterization.  Normal EDP.   ECHOCARDIOGRAM COMPLETE Result Date: 05/25/2023    ECHOCARDIOGRAM REPORT   Patient Name:   Thomas [name] Vanbuskirk Date of Exam: 05/25/2023 Medical Rec #:  [id number]     Height:       70.0 in Accession #:    [id number]    Weight:       169.0 lb Date of Birth:  29-Mar-1946     BSA:          1.943 m Patient Age:    18 years      BP:           140/77 mmHg Patient Gender: M             HR:           67 bpm. Exam Location:  [facility] Procedure: 2D Echo, Color Doppler and Cardiac Doppler Indications:     Syncope  History:         Patient has prior history of Echocardiogram examinations, most                  recent 10/18/2022. Arrythmias:Atrial Fibrillation and                  Bradycardia, Signs/Symptoms:Syncope; Risk Factors:Hypertension                  and Dyslipidemia.  Sonographer:     Mikki Harbor Referring Phys:  [id number] JAN A MANSY Diagnosing Phys: Julien Nordmann MD IMPRESSIONS  1. Left ventricular ejection fraction, by estimation, is 55 to 60%. Left ventricular ejection fraction by 2D MOD biplane is 54.1 %. The left ventricle has normal function. The left ventricle demonstrates regional wall motion abnormalities (septal wall motion consistent with conduction abnormality). Left ventricular diastolic parameters are consistent with Grade I diastolic dysfunction (impaired relaxation).  2. Right ventricular systolic function is normal. The right ventricular size is normal. There is mildly elevated pulmonary artery systolic pressure. The estimated right ventricular systolic pressure is 38.5 mmHg.  3. The mitral valve is normal in structure. Mild mitral valve regurgitation. No evidence of mitral stenosis.  4. The aortic valve is normal in structure. Aortic valve regurgitation is mild. No aortic stenosis is present.  5. The inferior vena cava is normal in size with greater than  50% respiratory variability, suggesting right atrial pressure of 3  mmHg.  6. Frequent PVCs noted. FINDINGS  Left Ventricle: Left ventricular ejection fraction, by estimation, is 55 to 60%. Left ventricular ejection fraction by 2D MOD biplane is 54.1 %. The left ventricle has normal function. The left ventricle demonstrates regional wall motion abnormalities. The left ventricular internal cavity size was normal in size. There is no left ventricular hypertrophy. Left ventricular diastolic parameters are consistent with Grade I diastolic dysfunction (impaired relaxation). Right Ventricle: The right ventricular size is normal. No increase in right ventricular wall thickness. Right ventricular systolic function is normal. There is mildly elevated pulmonary artery systolic pressure. The tricuspid regurgitant velocity is 2.76  m/s, and with an assumed right atrial pressure of 8 mmHg, the estimated right ventricular systolic pressure is 38.5 mmHg. Left Atrium: Left atrial size was normal in size. Right Atrium: Right atrial size was normal in size. Pericardium: There is no evidence of pericardial effusion. Mitral Valve: The mitral valve is normal in structure. Mild mitral valve regurgitation. No evidence of mitral valve stenosis. MV peak gradient, 3.6 mmHg. The mean mitral valve gradient is 2.0 mmHg. Tricuspid Valve: The tricuspid valve is normal in structure. Tricuspid valve regurgitation is mild . No evidence of tricuspid stenosis. Aortic Valve: The aortic valve is normal in structure. Aortic valve regurgitation is mild. No aortic stenosis is present. Aortic valve mean gradient measures 4.0 mmHg. Aortic valve peak gradient measures 6.9 mmHg. Aortic valve area, by VTI measures 2.57 cm. Pulmonic Valve: The pulmonic valve was normal in structure. Pulmonic valve regurgitation is not visualized. No evidence of pulmonic stenosis. Aorta: The aortic root is normal in size and structure. Venous: The inferior vena cava is normal in  size with greater than 50% respiratory variability, suggesting right atrial pressure of 3 mmHg. IAS/Shunts: No atrial level shunt detected by color flow Doppler.  LEFT VENTRICLE PLAX 2D                        Biplane EF (MOD) LVIDd:         5.60 cm         LV Biplane EF:   Left LVIDs:         3.80 cm                          ventricular LV PW:         1.00 cm                          ejection LV IVS:        1.60 cm                          fraction by LVOT diam:     2.00 cm                          2D MOD LV SV:         75                               biplane is LV SV Index:   39                               54.1 %. LVOT Area:  3.14 cm                                Diastology                                LV e' medial:    4.54 cm/s LV Volumes (MOD)               LV E/e' medial:  14.4 LV vol d, MOD    87.7 ml       LV e' lateral:   7.22 cm/s A2C:                           LV E/e' lateral: 9.1 LV vol d, MOD    83.3 ml A4C: LV vol s, MOD    35.9 ml A2C: LV vol s, MOD    39.7 ml A4C: LV SV MOD A2C:   51.8 ml LV SV MOD A4C:   83.3 ml LV SV MOD BP:    46.8 ml RIGHT VENTRICLE RV Basal diam:  3.35 cm RV Mid diam:    3.00 cm RV S prime:     11.70 cm/s TAPSE (M-mode): 2.6 cm LEFT ATRIUM             Index        RIGHT ATRIUM           Index LA diam:        4.40 cm 2.26 cm/m   RA Area:     19.70 cm LA Vol (A2C):   63.2 ml 32.52 ml/m  RA Volume:   52.70 ml  27.12 ml/m LA Vol (A4C):   58.5 ml 30.11 ml/m LA Biplane Vol: 63.1 ml 32.47 ml/m  AORTIC VALVE                    PULMONIC VALVE AV Area (Vmax):    2.42 cm     PV Vmax:       0.83 m/s AV Area (Vmean):   2.12 cm     PV Peak grad:  2.8 mmHg AV Area (VTI):     2.57 cm AV Vmax:           131.00 cm/s AV Vmean:          91.100 cm/s AV VTI:            0.293 m AV Peak Grad:      6.9 mmHg AV Mean Grad:      4.0 mmHg LVOT Vmax:         101.00 cm/s LVOT Vmean:        61.400 cm/s LVOT VTI:          0.240 m LVOT/AV VTI ratio: 0.82  AORTA Ao Root diam: 4.00 cm MITRAL VALVE                TRICUSPID VALVE MV Area (PHT): 2.22 cm    TR Peak grad:   30.5 mmHg MV Area VTI:   2.63 cm    TR Vmax:        276.00 cm/s MV Peak grad:  3.6 mmHg MV Mean grad:  2.0 mmHg    SHUNTS MV Vmax:       0.95 m/s    Systemic VTI:  0.24 m MV Vmean:  58.7 cm/s   Systemic Diam: 2.00 cm MV Decel Time: 342 msec MV E velocity: 65.60 cm/s MV A velocity: 88.70 cm/s MV E/A ratio:  0.74 Julien Nordmann MD Electronically signed by Julien Nordmann MD Signature Date/Time: 05/25/2023/4:44:16 PM    Final    CT HEAD WO CONTRAST ( ) Result Date: 05/24/2023 CLINICAL DATA:  Dizziness, syncopal episode EXAM: CT HEAD WITHOUT CONTRAST TECHNIQUE: Contiguous axial images were obtained from the base of the skull through the vertex without intravenous contrast. RADIATION DOSE REDUCTION: This exam was performed according to the departmental dose-optimization program which includes automated exposure control, adjustment of the mA and/or kV according to patient size and/or use of iterative reconstruction technique. COMPARISON:  None Available. FINDINGS: Brain: No acute infarct or hemorrhage. Lateral ventricles and midline structures are unremarkable. No acute extra-axial fluid collections. No mass effect. Vascular: Atherosclerosis of the internal carotid arteries. No hyperdense vessel. Skull: Normal. Negative for fracture or focal lesion. Sinuses/Orbits: Mild mucosal thickening of the ethmoid air cells and left maxillary sinus. No gas fluid levels. Other: None. IMPRESSION: 1. No acute intracranial process. Electronically Signed   By: Sharlet Salina M.D.   On: 05/24/2023 22:57   DG Chest Portable 1 View Result Date: 05/24/2023 CLINICAL DATA:  Syncope.  Dysrhythmia. EXAM: PORTABLE CHEST 1 VIEW COMPARISON:  Chest radiograph dated 10/21/2022. FINDINGS: Left lung base atelectasis/scarring. Background of emphysema. No consolidative changes. There is no pleural effusion or pneumothorax. The cardiac silhouette is within limits.  Atherosclerotic calcification of the aorta. Left pectoral pacemaker device. No acute osseous pathology. IMPRESSION: 1. No active disease. 2. Emphysema. Electronically Signed   By: Elgie Collard M.D.   On: 05/24/2023 21:37     Discharge Exam: Vitals:   05/31/23 0804 05/31/23 1115  BP: (!) 149/72   Pulse: 61 60  Resp: 18 18  Temp: 98.1 F (36.7 C) 98.2 F (36.8 C)  SpO2: 94% 93%   Vitals:   05/31/23 0427 05/31/23 0512 05/31/23 0804 05/31/23 1115  BP: 130/70  (!) 149/72   Pulse: 60  61 60  Resp: 17  18 18   Temp: 98 F (36.7 C)  98.1 F (36.7 C) 98.2 F (36.8 C)  TempSrc: Oral  Oral Oral  SpO2: 96%  94% 93%  Weight:  74.6 kg    Height:        General: Pt is alert, awake, not in acute distress Cardiovascular: RRR, S1/S2 +, no rubs, no gallops Respiratory: CTA bilaterally, no wheezing, no rhonchi Abdominal: Soft, NT, ND, bowel sounds + Extremities: no edema, no cyanosis    The results of significant diagnostics from this hospitalization (including imaging, microbiology, ancillary and laboratory) are listed below for reference.     Microbiology: Recent Results (from the past 240 hours)  Surgical PCR screen     Status: None   Collection Time: 05/29/23  3:00 PM   Specimen: Nasal Mucosa; Nasal Swab  Result Value Ref Range Status   MRSA, PCR NEGATIVE NEGATIVE Final   Staphylococcus aureus NEGATIVE NEGATIVE Final    Comment: (NOTE) The Xpert SA Assay (FDA approved for NASAL specimens in patients 55 years of age and older), is one component of a comprehensive surveillance program. It is not intended to diagnose infection nor to guide or monitor treatment. Performed at [facility], [address] [place], [place] [address]      Labs: BNP (last 3 results) Recent Labs    05/24/23 2126  BNP 156.3*   Basic Metabolic Panel: Recent  Labs  Lab 05/25/23 1050 05/26/23 0245 05/27/23 0835 05/28/23 0232 05/29/23 0246 05/30/23 0247 05/31/23 0313  NA 132*  133* 131* 132* 132* 131* 129*  K 4.2 4.1 3.8 4.1 4.0 3.8 3.9  CL 97* 98 97* 97* 97* 96* 94*  CO2 24 24 23 24  21* 23 22  GLUCOSE 108* 110* 179* 109* 101* 103* 103*  BUN 19 14 16 12 13 15 14   CREATININE 0.96 1.08 1.13 1.00 1.08 1.02 1.09  CALCIUM 8.6* 8.7* 8.7* 8.7* 8.5* 8.8* 8.7*  MG 2.2 2.0  --   --  2.1 2.0 2.1  PHOS 3.2  --   --   --  3.9 3.7 3.7   Liver Function Tests: Recent Labs  Lab 05/25/23 1050 05/26/23 0245 05/29/23 0246 05/30/23 0247 05/31/23 0313  AST 17 14* 22 18 22   ALT 13 12 14 17 18   ALKPHOS 34* 37* 37* 32* 36*  BILITOT 1.3* 1.5* 0.9 1.0 1.2  PROT 7.0 7.6 7.4 7.4 7.7  ALBUMIN 2.9* 2.7* 2.5* 2.5* 2.5*   No results for input(s): "LIPASE", "AMYLASE" in the last 168 hours. No results for input(s): "AMMONIA" in the last 168 hours. CBC: Recent Labs  Lab 05/25/23 1050 05/26/23 0245 05/28/23 0232 05/29/23 0246 05/30/23 0247 05/31/23 0313  WBC 7.2 7.3 5.0 4.8 6.6 6.5  NEUTROABS 5.1 5.0  --  2.6 4.1 4.2  HGB 12.4* 13.2 12.8* 13.2 12.9* 12.6*  HCT 36.2* 37.4* 36.5* 37.7* 36.0* 35.6*  MCV 95.5 93.7 94.1 94.5 92.1 92.7  PLT 146* 145* 160 158 188 206   Cardiac Enzymes: No results for input(s): "CKTOTAL", "CKMB", "CKMBINDEX", "TROPONINI" in the last 168 hours. BNP: Invalid input(s): "POCBNP" CBG: Recent Labs  Lab 05/24/23 2117 05/29/23 2123 05/30/23 0600  GLUCAP 140* 117* 106*   D-Dimer No results for input(s): "DDIMER" in the last 72 hours. Hgb A1c No results for input(s): "HGBA1C" in the last 72 hours. Lipid Profile No results for input(s): "CHOL", "HDL", "LDLCALC", "TRIG", "CHOLHDL", "LDLDIRECT" in the last 72 hours. Thyroid function studies No results for input(s): "TSH", "T4TOTAL", "T3FREE", "THYROIDAB" in the last 72 hours.  Invalid input(s): "FREET3" Anemia work up Recent Labs    05/29/23 1420  VITAMINB12 107*  FOLATE 9.0  FERRITIN 211  TIBC 238*  IRON 41*  RETICCTPCT 1.3   Urinalysis    Component Value Date/Time   COLORURINE  STRAW (A) 05/24/2023 2127   APPEARANCEUR CLEAR (A) 05/24/2023 2127   LABSPEC 1.012 05/24/2023 2127   PHURINE 7.0 05/24/2023 2127   GLUCOSEU 50 (A) 05/24/2023 2127   HGBUR SMALL (A) 05/24/2023 2127   BILIRUBINUR NEGATIVE 05/24/2023 2127   KETONESUR NEGATIVE 05/24/2023 2127   PROTEINUR 30 (A) 05/24/2023 2127   NITRITE NEGATIVE 05/24/2023 2127   LEUKOCYTESUR NEGATIVE 05/24/2023 2127   Sepsis Labs Recent Labs  Lab 05/28/23 0232 05/29/23 0246 05/30/23 0247 05/31/23 0313  WBC 5.0 4.8 6.6 6.5   Microbiology Recent Results (from the past 240 hours)  Surgical PCR screen     Status: None   Collection Time: 05/29/23  3:00 PM   Specimen: Nasal Mucosa; Nasal Swab  Result Value Ref Range Status   MRSA, PCR NEGATIVE NEGATIVE Final   Staphylococcus aureus NEGATIVE NEGATIVE Final    Comment: (NOTE) The Xpert SA Assay (FDA approved for NASAL specimens in patients 18 years of age and older), is one component of a comprehensive surveillance program. It is not intended to diagnose infection nor to guide or monitor treatment. Performed at [facility]  [facility], [address] [place], [place] [address]     FURTHER DISCHARGE INSTRUCTIONS:   Get Medicines reviewed and adjusted: Please take all your medications with you for your next visit with your Primary MD   Laboratory/radiological data: Please request your Primary MD to go over all hospital tests and procedure/radiological results at the follow up, please ask your Primary MD to get all Hospital records sent to his/her office.   In some cases, they will be blood work, cultures and biopsy results pending at the time of your discharge. Please request that your primary care M.D. goes through all the records of your hospital data and follows up on these results.   Also Note the following: If you experience worsening of your admission symptoms, develop shortness of breath, life threatening emergency, suicidal or homicidal thoughts you must seek  medical attention immediately by calling 911 or calling your MD immediately  if symptoms less severe.   You must read complete instructions/literature along with all the possible adverse reactions/side effects for all the Medicines you take and that have been prescribed to you. Take any new Medicines after you have completely understood and accpet all the possible adverse reactions/side effects.    Do not drive when taking Pain medications or sleeping medications (Benzodaizepines)   Do not take more than prescribed Pain, Sleep and Anxiety Medications. It is not advisable to combine anxiety,sleep and pain medications without talking with your primary care practitioner   Special Instructions: If you have smoked or chewed Tobacco  in the last 2 yrs please stop smoking, stop any regular Alcohol  and or any Recreational drug use.   Wear Seat belts while driving.   Please note: You were cared for by a hospitalist during your hospital stay. Once you are discharged, your primary care physician will handle any further medical issues. Please note that NO REFILLS for any discharge medications will be authorized once you are discharged, as it is imperative that you return to your primary care physician (or establish a relationship with a primary care physician if you do not have one) for your post hospital discharge needs so that they can reassess your need for medications and monitor your lab values  Time coordinating discharge: Over 30 minutes  SIGNED:   Hughie Closs, MD  [facility] 05/31/2023, 12:01 PM *Please note that this is a verbal dictation therefore any spelling or grammatical errors are due to the "[employer] One" system interpretation. If 7PM-7AM, please contact night-coverage [email or web address]

## 2023-05-31 NOTE — Progress Notes (Addendum)
  Patient Name: Thomas [name] Date of Encounter: 05/31/2023  Primary Cardiologist: Julien Nordmann, MD Electrophysiologist: None  Interval Summary   The patient is doing well today.  Pt hopeful to go home. At this time, the patient denies chest pain, shortness of breath, or any new concerns.  Vital Signs    Vitals:   05/31/23 0426 05/31/23 0427 05/31/23 0512 05/31/23 0804  BP:  130/70  (!) 149/72  Pulse: 60 60  61  Resp:  17  18  Temp: 98 F (36.7 C) 98 F (36.7 C)  98.1 F (36.7 C)  TempSrc:  Oral  Oral  SpO2: 95% 96%  94%  Weight:   74.6 kg   Height:        Intake/Output Summary (Last 24 hours) at 05/31/2023 1033 Last data filed at 05/31/2023 0400 Gross per 24 hour  Intake 277 ml  Output 800 ml  Net -523 ml   Filed Weights   05/29/23 0425 05/30/23 0425 05/31/23 0512  Weight: 70.3 kg 74.8 kg 74.6 kg    Physical Exam    GEN- The patient is well appearing, alert and oriented x 3 today.   Lungs- Clear to ausculation bilaterally, normal work of breathing Cardiac- Regular rate and rhythm, no murmurs, rubs or gallops GI- soft, NT, ND, + BS Extremities- no clubbing or cyanosis. No edema  Telemetry    AP 60, occasional VP / SR (personally reviewed)  Studies:  ECHO 05/25/23 > LVEF 55-60%, G1 DD  LHC 05/26/23 > no significant coronary disease by cardiac catheterization, normal EDP  Hospital Course    Thomas [name] is a 78 y.o. 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 Device interrogation showed VT with cycle lenth of ~220BPM, episode lasted .   Assessment & Plan     Sustained VT  Normal LVEF, non-significant CAD on LHC. Episode lasting 6 minutes by device.  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  Attempted to increase VIP / paced AV delay up to 450 ms and he continues to VP.  At DDI 40, he has an underlying rate of 46 bpm AS/VS with PR of 234-242 ms. LRL changed to 50bpm.   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  Signed, Canary Brim, NP-C, AGACNP-BC [facility] - Electrophysiology  05/31/2023, 11:17 AM

## 2023-05-31 NOTE — Progress Notes (Signed)
Patient and wife both verbalized understanding of dc instructions. Toc meds given to wife.patient still waiting on life vest

## 2023-06-01 ENCOUNTER — Telehealth: Payer: Self-pay

## 2023-06-01 DIAGNOSIS — I472 Ventricular tachycardia, unspecified: Secondary | ICD-10-CM | POA: Diagnosis not present

## 2023-06-01 DIAGNOSIS — R55 Syncope and collapse: Secondary | ICD-10-CM

## 2023-06-01 NOTE — Transitions of Care (Post Inpatient/ED Visit) (Signed)
   06/01/2023  Name: Thomas [name] MRN: [id number] DOB: 07-19-1945  Today's TOC FU Call Status: Today's TOC FU Call Status:: Successful TOC FU Call Completed TOC FU Call Complete Date: 06/01/23 Patient's Name and Date of Birth confirmed.  Transition Care Management Follow-up Telephone Call Date of Discharge: 05/31/23 Discharge Facility: [facility]) Type of Discharge: Inpatient Admission Primary Inpatient Discharge Diagnosis:: syncope How have you been since you were released from the hospital?: Better Any questions or concerns?: No  Items Reviewed: Did you receive and understand the discharge instructions provided?: Yes Medications obtained,verified, and reconciled?: Yes (Medications Reviewed) Any new allergies since your discharge?: No Dietary orders reviewed?: Yes Do you have support at home?: Yes People in Home: spouse  Medications Reviewed Today: Medications Reviewed Today     Reviewed by Karena Addison, LPN (Licensed Practical Nurse) on 06/01/23 at 0911  Med List Status: <None>   Medication Order Taking? Sig Documenting Provider Last Dose Status Informant  acetaminophen (TYLENOL) 325 MG tablet [id number] No Take 1-2 tablets (325-650 mg total) by mouth every 4 (four) hours as needed for mild pain. Graciella Freer, PA-C 05/24/2023 Active Spouse/Significant Other, Pharmacy Records  amiodarone (PACERONE) 200 MG tablet [id number]  Take 2 tablets (400 mg total) by mouth 2 (two) times daily for 7 days, THEN 1 tablet (200 mg total) daily for 27 days. Hughie Closs, MD  Active   amLODipine (NORVASC) 2.5 MG tablet [id number] No Take 1 tablet (2.5 mg total) by mouth daily. May take a second tablet (2.5 MG ) daily as needed for pressure greater then 150. Antonieta Iba, MD 05/24/2023 Active Spouse/Significant Other, Pharmacy Records  apixaban (ELIQUIS) 5 MG TABS tablet [id number] No Take 1 tablet (5 mg total) by mouth 2 (two) times daily. Luane School 05/24/2023  Active Spouse/Significant Other, Pharmacy Records           Med Note Lawerance Sabal May 25, 2023  5:34 PM) Eliquis prescription is filled at [facility]  metoprolol succinate (TOPROL-XL) 50 MG 24 hr tablet [id number]  Take 1 tablet (50 mg total) by mouth daily. Take with or immediately following a meal. Hughie Closs, MD  Active   rosuvastatin (CRESTOR) 5 MG tablet [id number] No Take 1 tablet (5 mg total) by mouth every other day. At bedtime. Smitty Cords, DO 05/24/2023 Active Spouse/Significant Other, Pharmacy Records  Saw Palmetto 450 MG CAPS [id number] No Take 3 capsules by mouth daily. [provider] 05/24/2023 Active Spouse/Significant Other, Pharmacy Records            Home Care and Equipment/Supplies: Were Home Health Services Ordered?: NA Any new equipment or medical supplies ordered?: NA  Functional Questionnaire: Do you need assistance with bathing/showering or dressing?: No Do you need assistance with meal preparation?: No Do you need assistance with eating?: No Do you have difficulty maintaining continence: No Do you need assistance with getting out of bed/getting out of a chair/moving?: No Do you have difficulty managing or taking your medications?: No  Follow up appointments reviewed: PCP Follow-up appointment confirmed?: Yes Date of PCP follow-up appointment?: 06/07/23 Follow-up Provider: [facility] Follow-up appointment confirmed?: No Reason Specialist Follow-Up Not Confirmed: Patient has Specialist Provider Number and will Call for Appointment Do you need transportation to your follow-up appointment?: No Do you understand care options if your condition(s) worsen?: Yes-patient verbalized understanding    SIGNATURE Karena Addison, LPN [facility] Nurse Health Advisor Direct Dial [phone]

## 2023-06-02 ENCOUNTER — Telehealth: Payer: Self-pay | Admitting: *Deleted

## 2023-06-02 NOTE — Progress Notes (Signed)
Complex Care Management Note  Care Guide Note 06/02/2023 Name: Thomas [name] MRN: [id number] DOB: Mar 18, 1946  [name] is a 78 y.o. year old male who sees Smitty Cords, DO for primary care. I reached out to Thomas [name] by phone today to offer complex care management services.  Thomas [name] was given information about Complex Care Management services today including:   The Complex Care Management services include support from the care team which includes your Nurse Coordinator, Clinical Social Worker, or Pharmacist.  The Complex Care Management team is here to help remove barriers to the health concerns and goals most important to you. Complex Care Management services are voluntary, and the patient may decline or stop services at any time by request to their care team member.   Complex Care Management Consent Status: Patient agreed to services and verbal consent obtained.   Follow up plan:  Telephone appointment with complex care management team member scheduled for:  06/14/2023  Encounter Outcome:  Patient Scheduled  Burman Nieves, CMA, Care Guide [facility]  [facility], [facility] Guide Direct Dial: [phone]  Fax: [phone] Website: [email or web address]

## 2023-06-05 ENCOUNTER — Ambulatory Visit: Payer: Medicare HMO

## 2023-06-07 ENCOUNTER — Encounter: Payer: Self-pay | Admitting: Family Medicine

## 2023-06-07 ENCOUNTER — Ambulatory Visit: Payer: Medicare HMO | Admitting: Family Medicine

## 2023-06-07 VITALS — BP 140/80 | HR 50 | Ht 69.0 in | Wt 175.0 lb

## 2023-06-07 DIAGNOSIS — R55 Syncope and collapse: Secondary | ICD-10-CM

## 2023-06-07 DIAGNOSIS — Z8679 Personal history of other diseases of the circulatory system: Secondary | ICD-10-CM | POA: Diagnosis not present

## 2023-06-07 NOTE — Patient Instructions (Addendum)
 Thank you for coming to the office today.    Please schedule a Follow-up Appointment to: Return if symptoms worsen or fail to improve.  If you have any other questions or concerns, please feel free to call the office or send a message through MyChart. You may also schedule an earlier appointment if necessary.  Additionally, you may be receiving a survey about your experience at our office within a few days to 1 week by e-mail or mail. We value your feedback.  Marsa Officer, DO [facility], [place]

## 2023-06-07 NOTE — Telephone Encounter (Signed)
Noted and addressed.

## 2023-06-07 NOTE — Progress Notes (Signed)
 Subjective:    Patient ID: Thomas [name], male    DOB: Jan 10, 1946, 78 y.o.   MRN: [id number]  Thomas [name] is a 78 y.o. male presenting on 06/07/2023 for Loss of Consciousness   HPI  Discussed the use of AI scribe software for clinical note transcription with the patient, who gave verbal consent to proceed.  History of Present Illness    Thomas [name] is a 78 year old male with syncope and ventricular tachycardia who presents for follow-up after recent hospitalization.        HOSPITAL FOLLOW-UP VISIT  Hospital/Location: [facility] ED on 05/24/23, then transferred to [facility] 05/25/23 Date of Admission: 05/25/23 Date of Discharge: 05/31/23 Transitions of care telephone call: 06/01/23 Julian Lemmings RN  Reason for Admission: Syncope [facility] H&P and Discharge Summary have been reviewed - Patient presents today 7 days after recent hospitalization.   He experienced a syncopal episode on May 24, 2023, leading to an emergency room visit and subsequent hospitalization from January 23 to May 31, 2023, for sustained ventricular tachycardia. During the hospital stay, he was treated with intravenous amiodarone  and later transitioned to oral amiodarone , initially at 400 mg twice daily, which was reduced to 200 mg twice daily starting today.  He is currently wearing a life vest, which has activated three times within the last three hours, once while pulling trash cans, once while pumping gas, and once without any apparent trigger. There is uncertainty about whether the device is functioning correctly or if he is not pressing the buttons adequately.  During his hospitalization, a cardiac catheterization was performed, which did not reveal any significant blockages. There was discussion about potentially upgrading his pacemaker or implanting a defibrillator, but the decision was made to manage his condition medically for now.  His blood pressure has been variable, with a noted low  reading of 90/55 mmHg on the evening of June 06, 2023, which was the first occurrence of such a low reading. He reported feeling fine despite the low reading.   Today, his blood pressure was recorded at 140/80 mmHg, which is closer to his usual readings.  He is also taking metoprolol , which was recently adjusted.    I have reviewed the discharge medication list, and have reconciled the current and discharge medications today.   Current Outpatient Medications:    acetaminophen  (TYLENOL ) 325 MG tablet, Take 1-2 tablets (325-650 mg total) by mouth every 4 (four) hours as needed for mild pain., Disp: , Rfl:    amiodarone  (PACERONE ) 200 MG tablet, Take 2 tablets (400 mg total) by mouth 2 (two) times daily for 7 days, THEN 1 tablet (200 mg total) daily for 27 days., Disp: 55 tablet, Rfl: 0   amLODipine  (NORVASC ) 2.5 MG tablet, Take 1 tablet (2.5 mg total) by mouth daily. May take a second tablet (2.5 MG ) daily as needed for pressure greater then 150., Disp: 180 tablet, Rfl: 3   apixaban  (ELIQUIS ) 5 MG TABS tablet, Take 1 tablet (5 mg total) by mouth 2 (two) times daily., Disp: 180 tablet, Rfl: 3   metoprolol  succinate (TOPROL -XL) 50 MG 24 hr tablet, Take 1 tablet (50 mg total) by mouth daily. Take with or immediately following a meal., Disp: 30 tablet, Rfl: 0   rosuvastatin  (CRESTOR ) 5 MG tablet, Take 1 tablet (5 mg total) by mouth every other day. At bedtime., Disp: 45 tablet, Rfl: 3   Saw Palmetto  450 MG CAPS, Take 3 capsules by mouth daily.,  Disp: , Rfl:   ------------------------------------------------------------------------- Social History   Tobacco Use   Smoking status: Former    Current packs/day: 0.00    Types: Cigarettes    Quit date: 05/02/1970    Years since quitting: 53.1   Smokeless tobacco: Never  Vaping Use   Vaping status: Never Used  Substance Use Topics   Alcohol use: No   Drug use: No    Review of Systems Per HPI unless specifically indicated above      Objective:    BP (!) 140/80 (BP Location: Left Arm, Cuff Size: Normal)   Pulse (!) 50   Ht 5' 9 (1.753 m)   Wt 175 lb (79.4 kg)   SpO2 98%   BMI 25.84 kg/m   Wt Readings from Last 3 Encounters:  06/07/23 175 lb (79.4 kg)  05/31/23 164 lb 7.4 oz (74.6 kg)  05/24/23 169 lb (76.7 kg)    Physical Exam Vitals and nursing note reviewed.  Constitutional:      General: He is not in acute distress.    Appearance: He is well-developed. He is not diaphoretic.     Comments: Well-appearing, comfortable, cooperative  HENT:     Head: Normocephalic and atraumatic.  Eyes:     General:        Right eye: No discharge.        Left eye: No discharge.     Conjunctiva/sclera: Conjunctivae normal.  Neck:     Thyroid : No thyromegaly.  Cardiovascular:     Rate and Rhythm: Normal rate and regular rhythm.     Pulses: Normal pulses.     Heart sounds: Normal heart sounds. No murmur heard.    Comments: Life vest in place Pulmonary:     Effort: Pulmonary effort is normal. No respiratory distress.     Breath sounds: Normal breath sounds. No wheezing or rales.  Musculoskeletal:        General: Normal range of motion.     Cervical back: Normal range of motion and neck supple.  Lymphadenopathy:     Cervical: No cervical adenopathy.  Skin:    General: Skin is warm and dry.     Findings: No erythema or rash.  Neurological:     Mental Status: He is alert and oriented to person, place, and time. Mental status is at baseline.  Psychiatric:        Behavior: Behavior normal.     Comments: Well groomed, good eye contact, normal speech and thoughts       Results for orders placed or performed during the hospital encounter of 05/25/23  Comprehensive metabolic panel   Collection Time: 05/26/23  2:45 AM  Result Value Ref Range   Sodium 133 (L) 135 - 145 mmol/L   Potassium 4.1 3.5 - 5.1 mmol/L   Chloride 98 98 - 111 mmol/L   CO2 24 22 - 32 mmol/L   Glucose, Bld 110 (H) 70 - 99 mg/dL   BUN 14 8 - 23  mg/dL   Creatinine, Ser 8.91 0.61 - 1.24 mg/dL   Calcium  8.7 (L) 8.9 - 10.3 mg/dL   Total Protein 7.6 6.5 - 8.1 g/dL   Albumin 2.7 (L) 3.5 - 5.0 g/dL   AST 14 (L) 15 - 41 U/L   ALT 12 0 - 44 U/L   Alkaline Phosphatase 37 (L) 38 - 126 U/L   Total Bilirubin 1.5 (H) 0.0 - 1.2 mg/dL   GFR, Estimated >39 >39 mL/min   Anion gap 11 5 -  15  Magnesium    Collection Time: 05/26/23  2:45 AM  Result Value Ref Range   Magnesium  2.0 1.7 - 2.4 mg/dL  CBC with Differential/Platelet   Collection Time: 05/26/23  2:45 AM  Result Value Ref Range   WBC 7.3 4.0 - 10.5 K/uL   RBC 3.99 (L) 4.22 - 5.81 MIL/uL   Hemoglobin 13.2 13.0 - 17.0 g/dL   HCT 62.5 (L) 60.9 - 47.9 %   MCV 93.7 80.0 - 100.0 fL   MCH 33.1 26.0 - 34.0 pg   MCHC 35.3 30.0 - 36.0 g/dL   RDW 86.4 88.4 - 84.4 %   Platelets 145 (L) 150 - 400 K/uL   nRBC 0.0 0.0 - 0.2 %   Neutrophils Relative % 69 %   Neutro Abs 5.0 1.7 - 7.7 K/uL   Lymphocytes Relative 20 %   Lymphs Abs 1.5 0.7 - 4.0 K/uL   Monocytes Relative 9 %   Monocytes Absolute 0.6 0.1 - 1.0 K/uL   Eosinophils Relative 1 %   Eosinophils Absolute 0.1 0.0 - 0.5 K/uL   Basophils Relative 0 %   Basophils Absolute 0.0 0.0 - 0.1 K/uL   Immature Granulocytes 1 %   Abs Immature Granulocytes 0.04 0.00 - 0.07 K/uL  Heparin  level (unfractionated)   Collection Time: 05/26/23  2:45 AM  Result Value Ref Range   Heparin  Unfractionated >1.10 (H) 0.30 - 0.70 IU/mL  APTT   Collection Time: 05/26/23  2:45 AM  Result Value Ref Range   aPTT 100 (H) 24 - 36 seconds  Basic metabolic panel   Collection Time: 05/27/23  8:35 AM  Result Value Ref Range   Sodium 131 (L) 135 - 145 mmol/L   Potassium 3.8 3.5 - 5.1 mmol/L   Chloride 97 (L) 98 - 111 mmol/L   CO2 23 22 - 32 mmol/L   Glucose, Bld 179 (H) 70 - 99 mg/dL   BUN 16 8 - 23 mg/dL   Creatinine, Ser 8.86 0.61 - 1.24 mg/dL   Calcium  8.7 (L) 8.9 - 10.3 mg/dL   GFR, Estimated >39 >39 mL/min   Anion gap 11 5 - 15  Lipoprotein A (LPA)    Collection Time: 05/27/23  8:35 AM  Result Value Ref Range   Lipoprotein (a) 42.4 (H) <75.0 nmol/L  APTT   Collection Time: 05/27/23  8:35 AM  Result Value Ref Range   aPTT 83 (H) 24 - 36 seconds  APTT   Collection Time: 05/27/23  4:27 PM  Result Value Ref Range   aPTT 64 (H) 24 - 36 seconds  Heparin  level (unfractionated)   Collection Time: 05/28/23  2:32 AM  Result Value Ref Range   Heparin  Unfractionated 0.42 0.30 - 0.70 IU/mL  APTT   Collection Time: 05/28/23  2:32 AM  Result Value Ref Range   aPTT 82 (H) 24 - 36 seconds  CBC   Collection Time: 05/28/23  2:32 AM  Result Value Ref Range   WBC 5.0 4.0 - 10.5 K/uL   RBC 3.88 (L) 4.22 - 5.81 MIL/uL   Hemoglobin 12.8 (L) 13.0 - 17.0 g/dL   HCT 63.4 (L) 60.9 - 47.9 %   MCV 94.1 80.0 - 100.0 fL   MCH 33.0 26.0 - 34.0 pg   MCHC 35.1 30.0 - 36.0 g/dL   RDW 86.7 88.4 - 84.4 %   Platelets 160 150 - 400 K/uL   nRBC 0.0 0.0 - 0.2 %  Basic metabolic panel   Collection Time: 05/28/23  2:32 AM  Result Value Ref Range   Sodium 132 (L) 135 - 145 mmol/L   Potassium 4.1 3.5 - 5.1 mmol/L   Chloride 97 (L) 98 - 111 mmol/L   CO2 24 22 - 32 mmol/L   Glucose, Bld 109 (H) 70 - 99 mg/dL   BUN 12 8 - 23 mg/dL   Creatinine, Ser 8.99 0.61 - 1.24 mg/dL   Calcium  8.7 (L) 8.9 - 10.3 mg/dL   GFR, Estimated >39 >39 mL/min   Anion gap 11 5 - 15  Heparin  level (unfractionated)   Collection Time: 05/29/23  2:46 AM  Result Value Ref Range   Heparin  Unfractionated 0.39 0.30 - 0.70 IU/mL  Comprehensive metabolic panel   Collection Time: 05/29/23  2:46 AM  Result Value Ref Range   Sodium 132 (L) 135 - 145 mmol/L   Potassium 4.0 3.5 - 5.1 mmol/L   Chloride 97 (L) 98 - 111 mmol/L   CO2 21 (L) 22 - 32 mmol/L   Glucose, Bld 101 (H) 70 - 99 mg/dL   BUN 13 8 - 23 mg/dL   Creatinine, Ser 8.91 0.61 - 1.24 mg/dL   Calcium  8.5 (L) 8.9 - 10.3 mg/dL   Total Protein 7.4 6.5 - 8.1 g/dL   Albumin 2.5 (L) 3.5 - 5.0 g/dL   AST 22 15 - 41 U/L   ALT 14 0 - 44  U/L   Alkaline Phosphatase 37 (L) 38 - 126 U/L   Total Bilirubin 0.9 0.0 - 1.2 mg/dL   GFR, Estimated >39 >39 mL/min   Anion gap 14 5 - 15  CBC with Differential/Platelet   Collection Time: 05/29/23  2:46 AM  Result Value Ref Range   WBC 4.8 4.0 - 10.5 K/uL   RBC 3.99 (L) 4.22 - 5.81 MIL/uL   Hemoglobin 13.2 13.0 - 17.0 g/dL   HCT 62.2 (L) 60.9 - 47.9 %   MCV 94.5 80.0 - 100.0 fL   MCH 33.1 26.0 - 34.0 pg   MCHC 35.0 30.0 - 36.0 g/dL   RDW 86.7 88.4 - 84.4 %   Platelets 158 150 - 400 K/uL   nRBC 0.0 0.0 - 0.2 %   Neutrophils Relative % 54 %   Neutro Abs 2.6 1.7 - 7.7 K/uL   Lymphocytes Relative 32 %   Lymphs Abs 1.6 0.7 - 4.0 K/uL   Monocytes Relative 9 %   Monocytes Absolute 0.4 0.1 - 1.0 K/uL   Eosinophils Relative 5 %   Eosinophils Absolute 0.2 0.0 - 0.5 K/uL   Basophils Relative 0 %   Basophils Absolute 0.0 0.0 - 0.1 K/uL   Immature Granulocytes 0 %   Abs Immature Granulocytes 0.01 0.00 - 0.07 K/uL  Phosphorus   Collection Time: 05/29/23  2:46 AM  Result Value Ref Range   Phosphorus 3.9 2.5 - 4.6 mg/dL  Magnesium    Collection Time: 05/29/23  2:46 AM  Result Value Ref Range   Magnesium  2.1 1.7 - 2.4 mg/dL  Miscellaneous [facility] test (send-out)   Collection Time: 05/29/23  2:19 PM  Result Value Ref Range   [facility] test code [id number]    [facility] test name VITAMIN B12    Source ([facility]) SERUM    Misc [facility] result COMMENT   Miscellaneous [facility] test (send-out)   Collection Time: 05/29/23  2:19 PM  Result Value Ref Range   [facility] test code [id number]    [facility] test name FERRITIN    Source ([facility]) 1ML SERUM  Misc [facility] result COMMENT   Miscellaneous [facility] test (send-out)   Collection Time: 05/29/23  2:19 PM  Result Value Ref Range   [facility] test code [id number]    [facility] test name [facility]    Source ([facility]) SERUM    Misc [facility] result COMMENT   Vitamin B12   Collection Time: 05/29/23  2:20 PM  Result Value Ref Range   Vitamin B-12 107 (L) 180 -  914 pg/mL  Folate   Collection Time: 05/29/23  2:20 PM  Result Value Ref Range   Folate 9.0 >5.9 ng/mL  Iron and TIBC   Collection Time: 05/29/23  2:20 PM  Result Value Ref Range   Iron 41 (L) 45 - 182 ug/dL   TIBC 761 (L) 749 - 549 ug/dL   Saturation Ratios 17 (L) 17.9 - 39.5 %   UIBC 197 ug/dL  Ferritin   Collection Time: 05/29/23  2:20 PM  Result Value Ref Range   Ferritin 211 24 - 336 ng/mL  Reticulocytes   Collection Time: 05/29/23  2:20 PM  Result Value Ref Range   Retic Ct Pct 1.3 0.4 - 3.1 %   RBC. 4.01 (L) 4.22 - 5.81 MIL/uL   Retic Count, Absolute 50.1 19.0 - 186.0 K/uL   Immature Retic Fract 10.6 2.3 - 15.9 %  Surgical PCR screen   Collection Time: 05/29/23  3:00 PM   Specimen: Nasal Mucosa; Nasal Swab  Result Value Ref Range   MRSA, PCR NEGATIVE NEGATIVE   Staphylococcus aureus NEGATIVE NEGATIVE  Glucose, capillary   Collection Time: 05/29/23  9:23 PM  Result Value Ref Range   Glucose-Capillary 117 (H) 70 - 99 mg/dL  Heparin  level (unfractionated)   Collection Time: 05/30/23  2:47 AM  Result Value Ref Range   Heparin  Unfractionated 0.38 0.30 - 0.70 IU/mL  CBC with Differential/Platelet   Collection Time: 05/30/23  2:47 AM  Result Value Ref Range   WBC 6.6 4.0 - 10.5 K/uL   RBC 3.91 (L) 4.22 - 5.81 MIL/uL   Hemoglobin 12.9 (L) 13.0 - 17.0 g/dL   HCT 63.9 (L) 60.9 - 47.9 %   MCV 92.1 80.0 - 100.0 fL   MCH 33.0 26.0 - 34.0 pg   MCHC 35.8 30.0 - 36.0 g/dL   RDW 86.7 88.4 - 84.4 %   Platelets 188 150 - 400 K/uL   nRBC 0.0 0.0 - 0.2 %   Neutrophils Relative % 62 %   Neutro Abs 4.1 1.7 - 7.7 K/uL   Lymphocytes Relative 26 %   Lymphs Abs 1.7 0.7 - 4.0 K/uL   Monocytes Relative 8 %   Monocytes Absolute 0.5 0.1 - 1.0 K/uL   Eosinophils Relative 4 %   Eosinophils Absolute 0.2 0.0 - 0.5 K/uL   Basophils Relative 0 %   Basophils Absolute 0.0 0.0 - 0.1 K/uL   Immature Granulocytes 0 %   Abs Immature Granulocytes 0.02 0.00 - 0.07 K/uL  Comprehensive  metabolic panel   Collection Time: 05/30/23  2:47 AM  Result Value Ref Range   Sodium 131 (L) 135 - 145 mmol/L   Potassium 3.8 3.5 - 5.1 mmol/L   Chloride 96 (L) 98 - 111 mmol/L   CO2 23 22 - 32 mmol/L   Glucose, Bld 103 (H) 70 - 99 mg/dL   BUN 15 8 - 23 mg/dL   Creatinine, Ser 8.97 0.61 - 1.24 mg/dL   Calcium  8.8 (L) 8.9 - 10.3 mg/dL   Total Protein 7.4 6.5 -  8.1 g/dL   Albumin 2.5 (L) 3.5 - 5.0 g/dL   AST 18 15 - 41 U/L   ALT 17 0 - 44 U/L   Alkaline Phosphatase 32 (L) 38 - 126 U/L   Total Bilirubin 1.0 0.0 - 1.2 mg/dL   GFR, Estimated >39 >39 mL/min   Anion gap 12 5 - 15  Magnesium    Collection Time: 05/30/23  2:47 AM  Result Value Ref Range   Magnesium  2.0 1.7 - 2.4 mg/dL  Phosphorus   Collection Time: 05/30/23  2:47 AM  Result Value Ref Range   Phosphorus 3.7 2.5 - 4.6 mg/dL  Glucose, capillary   Collection Time: 05/30/23  6:00 AM  Result Value Ref Range   Glucose-Capillary 106 (H) 70 - 99 mg/dL  Heparin  level (unfractionated)   Collection Time: 05/31/23  3:13 AM  Result Value Ref Range   Heparin  Unfractionated 0.23 (L) 0.30 - 0.70 IU/mL  CBC with Differential/Platelet   Collection Time: 05/31/23  3:13 AM  Result Value Ref Range   WBC 6.5 4.0 - 10.5 K/uL   RBC 3.84 (L) 4.22 - 5.81 MIL/uL   Hemoglobin 12.6 (L) 13.0 - 17.0 g/dL   HCT 64.3 (L) 60.9 - 47.9 %   MCV 92.7 80.0 - 100.0 fL   MCH 32.8 26.0 - 34.0 pg   MCHC 35.4 30.0 - 36.0 g/dL   RDW 86.6 88.4 - 84.4 %   Platelets 206 150 - 400 K/uL   nRBC 0.0 0.0 - 0.2 %   Neutrophils Relative % 65 %   Neutro Abs 4.2 1.7 - 7.7 K/uL   Lymphocytes Relative 24 %   Lymphs Abs 1.6 0.7 - 4.0 K/uL   Monocytes Relative 7 %   Monocytes Absolute 0.5 0.1 - 1.0 K/uL   Eosinophils Relative 4 %   Eosinophils Absolute 0.3 0.0 - 0.5 K/uL   Basophils Relative 0 %   Basophils Absolute 0.0 0.0 - 0.1 K/uL   Immature Granulocytes 0 %   Abs Immature Granulocytes 0.02 0.00 - 0.07 K/uL  Comprehensive metabolic panel   Collection  Time: 05/31/23  3:13 AM  Result Value Ref Range   Sodium 129 (L) 135 - 145 mmol/L   Potassium 3.9 3.5 - 5.1 mmol/L   Chloride 94 (L) 98 - 111 mmol/L   CO2 22 22 - 32 mmol/L   Glucose, Bld 103 (H) 70 - 99 mg/dL   BUN 14 8 - 23 mg/dL   Creatinine, Ser 8.90 0.61 - 1.24 mg/dL   Calcium  8.7 (L) 8.9 - 10.3 mg/dL   Total Protein 7.7 6.5 - 8.1 g/dL   Albumin 2.5 (L) 3.5 - 5.0 g/dL   AST 22 15 - 41 U/L   ALT 18 0 - 44 U/L   Alkaline Phosphatase 36 (L) 38 - 126 U/L   Total Bilirubin 1.2 0.0 - 1.2 mg/dL   GFR, Estimated >39 >39 mL/min   Anion gap 13 5 - 15  Magnesium    Collection Time: 05/31/23  3:13 AM  Result Value Ref Range   Magnesium  2.1 1.7 - 2.4 mg/dL  Phosphorus   Collection Time: 05/31/23  3:13 AM  Result Value Ref Range   Phosphorus 3.7 2.5 - 4.6 mg/dL      Assessment & Plan:   Problem List Items Addressed This Visit     Syncope - Primary   Other Visit Diagnoses       History of ventricular tachycardia  History Syncope and Sustained Ventricular Tachycardia (VTAC) Recent hospitalization for syncope and VTAC. 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 Currently on amiodarone  400mg  BID, transitioning to 200mg  daily. Wearing a life vest, which has gone off three times without clear triggers or symptoms or concerns.  -Continue amiodarone  200mg  daily as prescribed by Cardiology -Continue monitoring with life vest and report any alarms to the device team. -Cardiology follow-up on 06/22/2023.  Discussed when to seek care immediately or when to contact Cardiology.  Hypertension Blood pressure readings have been normal range, with one low reading of 90/55 and others within normal limits. Patient is asymptomatic with low readings. -Continue current antihypertensive regimen per Cardiology, given this is managing heart arrhyhtmia as well. -Monitor blood pressure at home and report any consistently low or high readings.  Upcoming Cardiac MRI Scheduled for 07/03/2023 at  [facility] for further cardiac evaluation. -Attend scheduled appointment and discuss results with cardiologist.  General Health Maintenance -Continue to coordinate care with the help of the care manager.        No orders of the defined types were placed in this encounter.   Follow up plan: Return if symptoms worsen or fail to improve.   Marsa Officer, DO [facility] 06/07/2023, 2:43 PM

## 2023-06-07 NOTE — Addendum Note (Signed)
Addended by: Geralyn Flash D on: 06/07/2023 10:42 AM   Modules accepted: Orders

## 2023-06-14 ENCOUNTER — Other Ambulatory Visit: Payer: Self-pay

## 2023-06-14 NOTE — Patient Instructions (Addendum)
Thank you for allowing the Care Management team to participate in your care. It was great speaking with you today!    Reminders: Please continue to consistently monitor your blood pressure and write the readings in a log. Remember to take a second amlodipine if your systolic pressure (top number) is greater than 150.  Per Cardiology, current plan is to wear the LifeVest for three months. Please follow the Cardiologist instructions to avoid driving for 6 months or until cleared by their team. Please be sure to complete your Cardiology follow-up appointments with  Dr. Graciela Husbands and Dr. Yevette Edwards as scheduled. Please follow the safety and fall prevention measures that we discussed today. Our next outreach is scheduled for July 05, 2023 at 1230.    Please do not hesitate to contact me if you require assistance prior to our next outreach.    Juanell Fairly [facility] RN Care Manager Direct Dial: [phone]  Fax: [phone] Website: [email or web address]

## 2023-06-14 NOTE — Patient Outreach (Signed)
Care Management   Visit Note  06/14/2023 Name: Thomas [name] MRN: [id number] DOB: 1946-04-18  Subjective: Thomas [name] is a 78 y.o. year old male who is a primary care patient of Thomas Cords, DO. The Care Management team was consulted for assistance.      Engaged with Thomas [name] and his spouse Thomas [name] via telephone.  Assessment:  Review of patient past medical history, allergies, medications, health status, including review of consultants reports, laboratory and other test data, was performed as part of  evaluation and provision of care management services.    Outpatient Encounter Medications as of 06/14/2023  Medication Sig Note   acetaminophen (TYLENOL) 325 MG tablet Take 1-2 tablets (325-650 mg total) by mouth every 4 (four) hours as needed for mild pain.    amiodarone (PACERONE) 200 MG tablet Take 2 tablets (400 mg total) by mouth 2 (two) times daily for 7 days, THEN 1 tablet (200 mg total) daily for 27 days.    apixaban (ELIQUIS) 5 MG TABS tablet Take 1 tablet (5 mg total) by mouth 2 (two) times daily. 05/25/2023: Eliquis prescription is filled at [facility]   metoprolol succinate (TOPROL-XL) 50 MG 24 hr tablet Take 1 tablet (50 mg total) by mouth daily. Take with or immediately following a meal.    rosuvastatin (CRESTOR) 5 MG tablet Take 1 tablet (5 mg total) by mouth every other day. At bedtime.    Saw Palmetto 450 MG CAPS Take 3 capsules by mouth daily.    amLODipine (NORVASC) 2.5 MG tablet Take 1 tablet (2.5 mg total) by mouth daily. May take a second tablet (2.5 MG ) daily as needed for pressure greater then 150.    No facility-administered encounter medications on file as of 06/14/2023.    Interventions:  Goals      Monitor, Self-Manage And Reduce Symptoms of Hypertension, Atrial Fibrillation and Hyperlipidemia     Current Barriers:  Care Management support and education needs related to Hypertension, Atrial Fibrillation and Hyperlipidemia  Planned  Interventions: Reviewed current treatment plan related to Hypertension, Atrial Fibrillation, Hyperlipidemia self-management, and adherence to plan as established by provider.  Reviewed medications and indications for use. Provided information regarding importance of taking medication exactly as prescribed. Reports compliance with medications. Denies current concerns r/t medication management or prescription cost. Discussed bleeding risk associated with anticoagulant therapy and importance of self-monitoring for signs/symptoms of bleeding. Reviewed role and benefits of statin for ASCVD risk reduction. Discussed strategies to manage statin-induced myalgias. Provided education regarding established blood pressure parameters along with indications for notifying a provider. Reports monitoring BP. Recalls one elevated reading with systolic pressure in the 150s last week. Notes reading was before taking morning medication. Reports reading was within range later that day. Advised to monitor BP consistently and record readings.  Provided education regarding importance of assessing symptoms daily. Reviewed symptoms. Denies chest pain. Reports one episode of fluttering which he notes resolved quickly. Denies headaches, dizziness, or visual changes.  Provided education regarding cardiac prudent/heart healthy diet. Encouraged to read nutrition labels, limit sodium and cholesterol intake and avoid highly processed foods when possible.  Patient has been wearing a LifeVest since 05/31/23. Voiced concerns regarding ability to drive. Reviewed Cardiology instructions to avoid driving for 6 months. Thorough review of importance of following recommendations and activity restrictions as advised by the Cardiology. Reviewed safety and fall prevention measures. Screening for signs and symptoms of depression related to chronic disease state Assessed social determinant of health barriers Reviewed  pending appointments. Advised to  follow up with Dr. Graciela Husbands as scheduled on 06/22/23. Advised to complete MRI on 07/03/23 as scheduled. Confirmed spouse and a friend of the family are available to assist and provide transportation. Patient advised to seek medical attention if he experiences a head injury or if there is blood in the urine or stool. Reviewed s/sx of heart attack, stroke and worsening symptoms that require immediate medical attention.   BP Readings from Last 3 Encounters:  06/07/23 (!) 140/80  05/31/23 104/63  05/25/23 (!) 174/81    Lab Results  Component Value Date   CHOL 135 04/03/2023   HDL 35 (L) 04/03/2023   LDLCALC 80 04/03/2023   TRIG 107 04/03/2023   CHOLHDL 3.9 04/03/2023    Symptom Management: Attend all scheduled provider appointments Take all medications as exactly as prescribed Call pharmacy for medication refills 3-7 days in advance of running out of medications Check blood pressure and keep a log Wear LifeVest as instructed Follow recommended safety and fall prevention measures Seek medical attention if you experience a head injury or if there is blood in the urine or stool Avoid driving for 6 months as advised by the Cardiology team Read nutrition labels. Limit intake of sodium and cholesterol.  Eat whole grains, fruits and vegetables, lean meats and healthy fats.  Assess symptoms daily and notify the Cardiology team with changes Call for questions or new care management concerns as needed              PLAN Will follow up next month   Thomas [name] [facility] RN Care Manager Direct Dial: [phone]  Fax: [phone] Website: [email or web address]

## 2023-06-22 ENCOUNTER — Ambulatory Visit: Payer: Medicare HMO | Attending: Internal Medicine | Admitting: Internal Medicine

## 2023-06-22 VITALS — BP 136/78 | HR 50 | Ht 69.5 in | Wt 171.0 lb

## 2023-06-22 DIAGNOSIS — I491 Atrial premature depolarization: Secondary | ICD-10-CM

## 2023-06-22 DIAGNOSIS — I10 Essential (primary) hypertension: Secondary | ICD-10-CM | POA: Diagnosis not present

## 2023-06-22 DIAGNOSIS — E782 Mixed hyperlipidemia: Secondary | ICD-10-CM

## 2023-06-22 DIAGNOSIS — I442 Atrioventricular block, complete: Secondary | ICD-10-CM | POA: Diagnosis not present

## 2023-06-22 DIAGNOSIS — Z950 Presence of cardiac pacemaker: Secondary | ICD-10-CM

## 2023-06-22 DIAGNOSIS — I480 Paroxysmal atrial fibrillation: Secondary | ICD-10-CM | POA: Diagnosis not present

## 2023-06-22 DIAGNOSIS — I441 Atrioventricular block, second degree: Secondary | ICD-10-CM

## 2023-06-22 LAB — CUP PACEART INCLINIC DEVICE CHECK
Date Time Interrogation Session: 20250220192033
Implantable Lead Connection Status: 753985
Implantable Lead Connection Status: 753985
Implantable Lead Implant Date: 20240621
Implantable Lead Implant Date: 20240621
Implantable Lead Location: 753859
Implantable Lead Location: 753860
Implantable Lead Model: 3830
Implantable Pulse Generator Implant Date: 20240621
Pulse Gen Model: 2272
Pulse Gen Serial Number: 8179421

## 2023-06-22 MED ORDER — METOPROLOL SUCCINATE ER 50 MG PO TB24: 50.0000 mg | ORAL_TABLET | Freq: Every day | ORAL | 1 refills | Status: DC

## 2023-06-22 MED ORDER — AMIODARONE HCL 200 MG PO TABS: 200.0000 mg | ORAL_TABLET | Freq: Every day | ORAL | 1 refills | Status: DC

## 2023-06-22 NOTE — Patient Instructions (Signed)
Medication Instructions:  The current medical regimen is effective;  continue present plan and medications.  *If you need a refill on your cardiac medications before your next appointment, please call your pharmacy*   Follow-Up: At [facility], you and your health needs are our priority.  As part of our continuing mission to provide you with exceptional heart care, we have created designated Provider Care Teams.  These Care Teams include your primary Cardiologist (physician) and Advanced Practice Providers (APPs -  Physician Assistants and Nurse Practitioners) who all work together to provide you with the care you need, when you need it.  We recommend signing up for the patient portal called "MyChart".  Sign up information is provided on this After Visit Summary.  MyChart is used to connect with patients for Virtual Visits (Telemedicine).  Patients are able to view lab/test results, encounter notes, upcoming appointments, etc.  Non-urgent messages can be sent to your provider as well.   To learn more about what you can do with MyChart, go to [email or web address].    Your next appointment:   3-4 week(s)  Provider:   Sherie Don, NP    Other Instructions We will check lab work at your next visit.
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
## 2023-06-30 DIAGNOSIS — I472 Ventricular tachycardia, unspecified: Secondary | ICD-10-CM | POA: Diagnosis not present

## 2023-07-03 DIAGNOSIS — I472 Ventricular tachycardia, unspecified: Secondary | ICD-10-CM | POA: Diagnosis not present

## 2023-07-03 DIAGNOSIS — Z950 Presence of cardiac pacemaker: Secondary | ICD-10-CM | POA: Diagnosis not present

## 2023-07-03 DIAGNOSIS — N400 Enlarged prostate without lower urinary tract symptoms: Secondary | ICD-10-CM | POA: Diagnosis not present

## 2023-07-03 DIAGNOSIS — I10 Essential (primary) hypertension: Secondary | ICD-10-CM | POA: Diagnosis not present

## 2023-07-03 DIAGNOSIS — Z880 Allergy status to penicillin: Secondary | ICD-10-CM | POA: Diagnosis not present

## 2023-07-03 DIAGNOSIS — Z79899 Other long term (current) drug therapy: Secondary | ICD-10-CM | POA: Diagnosis not present

## 2023-07-03 DIAGNOSIS — Z45018 Encounter for adjustment and management of other part of cardiac pacemaker: Secondary | ICD-10-CM | POA: Diagnosis not present

## 2023-07-03 DIAGNOSIS — I480 Paroxysmal atrial fibrillation: Secondary | ICD-10-CM | POA: Diagnosis not present

## 2023-07-03 DIAGNOSIS — Z87891 Personal history of nicotine dependence: Secondary | ICD-10-CM | POA: Diagnosis not present

## 2023-07-03 DIAGNOSIS — I4439 Other atrioventricular block: Secondary | ICD-10-CM | POA: Diagnosis not present

## 2023-07-03 DIAGNOSIS — Z7982 Long term (current) use of aspirin: Secondary | ICD-10-CM | POA: Diagnosis not present

## 2023-07-03 DIAGNOSIS — Z7901 Long term (current) use of anticoagulants: Secondary | ICD-10-CM | POA: Diagnosis not present

## 2023-07-05 ENCOUNTER — Other Ambulatory Visit: Payer: Self-pay

## 2023-07-05 NOTE — Patient Outreach (Signed)
  Care Management   Visit Note  07/05/2023 Name: BRYAM TABORDA MRN: [id number] DOB: 1945-10-09  Subjective: KANAAN KAGAWA is a 78 y.o. year old male who is a primary care patient of Smitty Cords, DO. Spoke with Mr. Sirois and his spouse Graciella Belton via telephone today.   Assessment:  Review of patient past medical history, allergies, medications, health status, including review of consultants reports, laboratory and other test data, was performed as part of  evaluation and provision of care management services.   Outpatient Encounter Medications as of 07/05/2023  Medication Sig   acetaminophen (TYLENOL) 325 MG tablet Take 1-2 tablets (325-650 mg total) by mouth every 4 (four) hours as needed for mild pain.   amiodarone (PACERONE) 200 MG tablet Take 1 tablet (200 mg total) by mouth daily.   amLODipine (NORVASC) 2.5 MG tablet Take 1 tablet (2.5 mg total) by mouth daily. May take a second tablet (2.5 MG ) daily as needed for pressure greater then 150.   apixaban (ELIQUIS) 5 MG TABS tablet Take 1 tablet (5 mg total) by mouth 2 (two) times daily.   metoprolol succinate (TOPROL-XL) 50 MG 24 hr tablet Take 1 tablet (50 mg total) by mouth daily. Take with or immediately following a meal.   rosuvastatin (CRESTOR) 5 MG tablet Take 1 tablet (5 mg total) by mouth every other day. At bedtime.   Saw Palmetto 450 MG CAPS Take 3 capsules by mouth daily.   No facility-administered encounter medications on file as of 07/05/2023.    Interventions:

## 2023-07-05 NOTE — Patient Instructions (Signed)
Thank you for allowing the Care Management team to participate in your care.

## 2023-07-06 DIAGNOSIS — Z45018 Encounter for adjustment and management of other part of cardiac pacemaker: Secondary | ICD-10-CM | POA: Diagnosis not present

## 2023-07-18 NOTE — Progress Notes (Deleted)
     Electrophysiology Clinic Note    Date:  07/18/2023  Patient ID:  Thomas [name], Thomas [name] 1945-11-19, MRN [id number] PCP:  Smitty Cords, DO  Cardiologist:  Julien Nordmann, MD Electrophysiologist: Sherryl Manges, MD  ***refresh  Discussed the use of AI scribe software for clinical note transcription with the patient, who gave verbal consent to proceed.   Patient Profile    Chief Complaint: ***  History of Present Illness: Thomas [name] is a 78 y.o. male with PMH notable for CHB s/p PPM, VT, syncope, HTN; seen today for Sherryl Manges, MD for routine electrophysiology followup.      Since last being seen in our clinic the patient reports doing ***.  he denies chest pain, palpitations, dyspnea, PND, orthopnea, nausea, vomiting, dizziness, syncope, edema, weight gain, or early satiety.      Arrhythmia/Device History MERLIN PPM ST JUDE/RV 3830 mid septal lead    Device Information: ***  AAD History: ***    ROS:  Please see the history of present illness. All other systems are reviewed and otherwise negative.    Physical Exam    VS:  There were no vitals taken for this visit. BMI: There is no height or weight on file to calculate BMI.  Wt Readings from Last 3 Encounters:  06/22/23 171 lb (77.6 kg)  06/07/23 175 lb (79.4 kg)  05/31/23 164 lb 7.4 oz (74.6 kg)     GEN- The patient is well appearing, alert and oriented x 3 today.   Lungs- Clear to ausculation bilaterally, normal work of breathing.  Heart- {Blank single:19197::"Regular","Irregularly irregular"} rate and rhythm, no murmurs, rubs or gallops Extremities- {EDEMA LEVEL:28147::"No"} peripheral edema, warm, dry Skin-  *** device pocket well-healed, no tethering   Device interrogation done today and reviewed by myself:  Battery *** Lead thresholds, impedence, sensing stable *** *** episodes *** changes made today   Studies Reviewed   Previous EP, cardiology notes.    EKG {ACTION; IS/IS  NWG:[id number]} ordered. Personal review of EKG from {Blank single:19197::"today","***"} shows:  ***             Assessment and Plan     #) ***   #) ***   {Are you ordering a CV Procedure (e.g. stress test, cath, DCCV, TEE, etc)?   Press F2        :[id number]}   Current medicines are reviewed at length with the patient today.   The patient {ACTIONS; HAS/DOES NOT HAVE:19233} concerns regarding his medicines.  The following changes were made today:  {NONE DEFAULTED:18576}  Labs/ tests ordered today include: *** No orders of the defined types were placed in this encounter.    Disposition: Follow up with {EPMDS:28135} or EP APP {EPFOLLOW UP:28173}   Signed, Sherie Don, NP  07/18/23  4:20 PM  Electrophysiology [facility]

## 2023-07-19 ENCOUNTER — Telehealth: Payer: Self-pay | Admitting: Internal Medicine

## 2023-07-19 ENCOUNTER — Ambulatory Visit: Payer: Medicare HMO | Admitting: Cardiology

## 2023-07-19 NOTE — Telephone Encounter (Signed)
 Pt wife called primary care office and wanted to know if the order for her husbands MRI was received however there is no order placed just yet. Please advise

## 2023-07-19 NOTE — Telephone Encounter (Signed)
 Awaiting discussion with Dr.Klein to review if we can order CMRI with [facility], or continue with [facility].   Thanks!

## 2023-07-21 NOTE — Telephone Encounter (Signed)
 Received call back from patient wife. Advised of the following message below.   Patient also given number to [facility] scheduling to help assist in scheduled CMRI.   After review of notes, it looked like it was approved via insurance- only awaiting scheduling.   Advised patient wife to keep [place] updated if she needed any further assistance.

## 2023-07-21 NOTE — Telephone Encounter (Signed)
 I spoke with Dr.Klein yesterday in office, advised that we are unable to do the CMRI at [facility], due to the device and leads being different manufactures. I will try to contact [facility] to find out if we can get the testing completed.    I contacted wife, LVM advising to call me back to explain this, and advised I would try to help facilitate this testing being completed.  Left call back number to call back.

## 2023-07-24 DIAGNOSIS — Z85828 Personal history of other malignant neoplasm of skin: Secondary | ICD-10-CM | POA: Diagnosis not present

## 2023-07-24 DIAGNOSIS — L578 Other skin changes due to chronic exposure to nonionizing radiation: Secondary | ICD-10-CM | POA: Diagnosis not present

## 2023-07-24 DIAGNOSIS — Z872 Personal history of diseases of the skin and subcutaneous tissue: Secondary | ICD-10-CM | POA: Diagnosis not present

## 2023-07-24 DIAGNOSIS — Z86018 Personal history of other benign neoplasm: Secondary | ICD-10-CM | POA: Diagnosis not present

## 2023-07-25 ENCOUNTER — Ambulatory Visit (INDEPENDENT_AMBULATORY_CARE_PROVIDER_SITE_OTHER): Payer: Medicare HMO

## 2023-07-25 DIAGNOSIS — I441 Atrioventricular block, second degree: Secondary | ICD-10-CM | POA: Diagnosis not present

## 2023-07-25 LAB — CUP PACEART REMOTE DEVICE CHECK
Battery Remaining Longevity: 94 mo
Battery Remaining Percentage: 94 %
Battery Voltage: 3.01 V
Brady Statistic AP VP Percent: 99 %
Brady Statistic AP VS Percent: 1 %
Brady Statistic AS VP Percent: 1 %
Brady Statistic AS VS Percent: 1 %
Brady Statistic RA Percent Paced: 99 %
Brady Statistic RV Percent Paced: 99 %
Date Time Interrogation Session: 20250325044851
Implantable Lead Connection Status: 753985
Implantable Lead Connection Status: 753985
Implantable Lead Implant Date: 20240621
Implantable Lead Implant Date: 20240621
Implantable Lead Location: 753859
Implantable Lead Location: 753860
Implantable Lead Model: 3830
Implantable Pulse Generator Implant Date: 20240621
Lead Channel Impedance Value: 480 Ohm
Lead Channel Impedance Value: 630 Ohm
Lead Channel Pacing Threshold Amplitude: 0.5 V
Lead Channel Pacing Threshold Amplitude: 0.75 V
Lead Channel Pacing Threshold Pulse Width: 0.5 ms
Lead Channel Pacing Threshold Pulse Width: 0.5 ms
Lead Channel Sensing Intrinsic Amplitude: 12 mV
Lead Channel Sensing Intrinsic Amplitude: 2.6 mV
Lead Channel Setting Pacing Amplitude: 2 V
Lead Channel Setting Pacing Amplitude: 2.5 V
Lead Channel Setting Pacing Pulse Width: 0.5 ms
Lead Channel Setting Sensing Sensitivity: 2 mV
Pulse Gen Model: 2272
Pulse Gen Serial Number: 8179421

## 2023-07-30 DIAGNOSIS — I472 Ventricular tachycardia, unspecified: Secondary | ICD-10-CM | POA: Diagnosis not present

## 2023-08-01 ENCOUNTER — Other Ambulatory Visit: Payer: Self-pay

## 2023-08-15 DIAGNOSIS — H1045 Other chronic allergic conjunctivitis: Secondary | ICD-10-CM | POA: Diagnosis not present

## 2023-08-15 DIAGNOSIS — H26493 Other secondary cataract, bilateral: Secondary | ICD-10-CM | POA: Diagnosis not present

## 2023-08-22 ENCOUNTER — Other Ambulatory Visit: Payer: Self-pay
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
## 2023-08-22 NOTE — Patient Instructions (Signed)
 Thank you for allowing the Complex Care Management team to participate in your care.

## 2023-09-08 NOTE — Progress Notes (Signed)
 Remote pacemaker transmission.

## 2023-09-19 DIAGNOSIS — I472 Ventricular tachycardia, unspecified: Secondary | ICD-10-CM | POA: Diagnosis not present

## 2023-09-19 DIAGNOSIS — I083 Combined rheumatic disorders of mitral, aortic and tricuspid valves: Secondary | ICD-10-CM | POA: Diagnosis not present

## 2023-09-19 DIAGNOSIS — I080 Rheumatic disorders of both mitral and aortic valves: Secondary | ICD-10-CM | POA: Diagnosis not present

## 2023-09-28 ENCOUNTER — Other Ambulatory Visit: Payer: Self-pay

## 2023-09-28 NOTE — Patient Instructions (Signed)
Thank you for allowing the Chronic Care Management team to participate in your care.  
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
## 2023-10-05 ENCOUNTER — Ambulatory Visit: Payer: Self-pay | Admitting: Family Medicine

## 2023-10-16 ENCOUNTER — Encounter: Payer: Self-pay | Admitting: Family Medicine

## 2023-10-16 ENCOUNTER — Ambulatory Visit (INDEPENDENT_AMBULATORY_CARE_PROVIDER_SITE_OTHER): Admitting: Family Medicine

## 2023-10-16 VITALS — BP 126/80 | HR 68 | Ht 71.4 in | Wt 172.4 lb

## 2023-10-16 DIAGNOSIS — R55 Syncope and collapse: Secondary | ICD-10-CM | POA: Diagnosis not present

## 2023-10-16 DIAGNOSIS — N400 Enlarged prostate without lower urinary tract symptoms: Secondary | ICD-10-CM | POA: Diagnosis not present

## 2023-10-16 DIAGNOSIS — I10 Essential (primary) hypertension: Secondary | ICD-10-CM | POA: Diagnosis not present

## 2023-10-16 DIAGNOSIS — Z8679 Personal history of other diseases of the circulatory system: Secondary | ICD-10-CM

## 2023-10-16 NOTE — Progress Notes (Unsigned)
 Subjective:    Patient ID: Thomas [name], male    DOB: 1945-05-10, 78 y.o.   MRN: [id number]  Thomas [name] is a 78 y.o. male presenting on 10/16/2023 for Hypertension and Loss of Consciousness (History)   HPI  Discussed the use of AI scribe software for clinical note transcription with the patient, who gave verbal consent to proceed.  History of Present Illness   Thomas [name] is a 78 year old male who presents for follow-up on his cardiac management.  He has been experiencing issues with his cardiac management over the past several months. He was fitted with a life vest at the end of January, which he wore until the first week of May when he returned it due to discomfort and improper fit.  His blood pressure has been fluctuating, with occasional low readings. He monitors his blood pressure regularly, noting that it tends to be lower in the evenings.  An MRI was scheduled for May 2nd, but due to a location error, it was rescheduled and completed on May 20th. Communication with healthcare providers has been challenging, as his primary cardiologist is currently on sick leave. He has an upcoming appointment with a PA on June 30th and a device check on June 24th.  His current medications include metoprolol  50 mg extended release and amlodipine  2.5 mg. He occasionally skips doses of amlodipine  when his blood pressure is low. He reports doing well overall, with only occasional adjustments needed for his medication regimen.  He continues with [facility] chronic care management team with RN     [facility] Cardiology 07/03/23 Dr Randon Butters ordered Cardiac MRI >>  Unable to get in to EP Cardiology due to Dr Rodolfo Clan sick leave Pacer check 10/24/23 Next apt 10/30/23 with Adaline Holly NP for Cardiology EP  Note additional update on meds - Dr Jerelene Monday signs off on medication application for Eliquis  for free through [place] program      10/16/2023    8:40 AM 09/28/2023   11:45 AM 09/28/2023   11:15 AM  Depression screen  PHQ 2/9  Decreased Interest 0 0 0  Down, Depressed, Hopeless 0 0 0  PHQ - 2 Score 0 0 0  Altered sleeping 0    Tired, decreased energy 1    Change in appetite 0    Feeling bad or failure about yourself  0    Trouble concentrating 0    Moving slowly or fidgety/restless 0    Suicidal thoughts 0    PHQ-9 Score 1    Difficult doing work/chores Not difficult at all         10/16/2023    8:41 AM 08/22/2023    4:40 PM 06/07/2023    1:51 PM 04/03/2023    9:55 AM  GAD 7 : Generalized Anxiety Score  Nervous, Anxious, on Edge 0 0 0 0  Control/stop worrying 0 0 0 0  Worry too much - different things 0 0 0 0  Trouble relaxing 0 0 0 0  Restless 0 0 0 0  Easily annoyed or irritable 0 0 0 0  Afraid - awful might happen 0 0 0 0  Total GAD 7 Score 0 0 0 0  Anxiety Difficulty  Not difficult at all  Not difficult at all    Social History   Tobacco Use   Smoking status: Former    Current packs/day: 0.00    Types: Cigarettes    Quit date: 05/02/1970    Years since quitting:  53.4   Smokeless tobacco: Never  Vaping Use   Vaping status: Never Used  Substance Use Topics   Alcohol use: No   Drug use: No    Review of Systems Per HPI unless specifically indicated above     Objective:    BP 126/80 (BP Location: Right Arm, Patient Position: Sitting, Cuff Size: Normal)   Pulse 68   Ht 5' 11.4 (1.814 m)   Wt 172 lb 6.4 oz (78.2 kg)   SpO2 93%   BMI 23.78 kg/m   Wt Readings from Last 3 Encounters:  10/16/23 172 lb 6.4 oz (78.2 kg)  06/22/23 171 lb (77.6 kg)  06/07/23 175 lb (79.4 kg)    Physical Exam Vitals and nursing note reviewed.  Constitutional:      General: He is not in acute distress.    Appearance: He is well-developed. He is not diaphoretic.     Comments: Well-appearing, comfortable, cooperative  HENT:     Head: Normocephalic and atraumatic.   Eyes:     General:        Right eye: No discharge.        Left eye: No discharge.     Conjunctiva/sclera: Conjunctivae  normal.   Neck:     Thyroid: No thyromegaly.   Cardiovascular:     Rate and Rhythm: Normal rate and regular rhythm.     Pulses: Normal pulses.     Heart sounds: Normal heart sounds. No murmur heard. Pulmonary:     Effort: Pulmonary effort is normal. No respiratory distress.     Breath sounds: Normal breath sounds. No wheezing or rales.   Musculoskeletal:        General: Normal range of motion.     Cervical back: Normal range of motion and neck supple.  Lymphadenopathy:     Cervical: No cervical adenopathy.   Skin:    General: Skin is warm and dry.     Findings: No erythema or rash.   Neurological:     Mental Status: He is alert and oriented to person, place, and time. Mental status is at baseline.   Psychiatric:        Behavior: Behavior normal.     Comments: Well groomed, good eye contact, normal speech and thoughts     Results for orders placed or performed in visit on 07/25/23  CUP PACEART REMOTE DEVICE CHECK   Collection Time: 07/25/23  4:48 AM  Result Value Ref Range   Date Time Interrogation Session [id number]    Pulse Generator Manufacturer SJCR    Pulse Gen Model 2272 Assurity MRI    Pulse Gen Serial Number [id number]    Clinic Name [facility]    Implantable Pulse Generator Type Implantable Pulse Generator    Implantable Pulse Generator Implant Date [id number]    Implantable Lead Manufacturer MERM    Implantable Lead Model 3830 SelectSecure MRI SureScan    Implantable Lead Serial Number [id number]    Implantable Lead Implant Date 65784696    Implantable Lead Location Detail 1 UNKNOWN    Implantable Lead Special Function LBBB    Implantable Lead Location [id number]    Implantable Lead Connection Status [id number]    Implantable Lead Manufacturer OTHER    Implantable Lead Model [id number] [facility]    Implantable Lead Serial Number [id number]    Implantable Lead Implant Date [id number]    Implantable Lead Location Detail 1 UNKNOWN    Implantable Lead Location  [id number]    Implantable Lead Connection Status [id number]  Lead Channel Setting Sensing Sensitivity 2.0 mV   Lead Channel Setting Sensing Adaptation Mode Fixed Pacing    Lead Channel Setting Pacing Amplitude 2.0 V   Lead Channel Setting Pacing Pulse Width 0.5 ms   Lead Channel Setting Pacing Amplitude 2.5 V   Lead Channel Status NULL    Lead Channel Impedance Value 480 ohm   Lead Channel Sensing Intrinsic Amplitude 2.6 mV   Lead Channel Pacing Threshold Amplitude 0.5 V   Lead Channel Pacing Threshold Pulse Width 0.5 ms   Lead Channel Status NULL    Lead Channel Impedance Value 630 ohm   Lead Channel Sensing Intrinsic Amplitude 12.0 mV   Lead Channel Pacing Threshold Amplitude 0.75 V   Lead Channel Pacing Threshold Pulse Width 0.5 ms   Battery Status MOS    Battery Remaining Longevity 94 mo   Battery Remaining Percentage 94.0 %   Battery Voltage 3.01 V   Brady Statistic RA Percent Paced 99.0 %   Brady Statistic RV Percent Paced 99.0 %   Brady Statistic AP VP Percent 99.0 %   Brady Statistic AS VP Percent 1.0 %   Brady Statistic AP VS Percent 1.0 %   Brady Statistic AS VS Percent 1.0 %      Assessment & Plan:   Problem List Items Addressed This Visit     BPH (benign prostatic hyperplasia)   Essential hypertension - Primary   Syncope   Other Visit Diagnoses       History of ventricular tachycardia            Hypertension Blood pressure generally well-controlled with occasional low readings. Isolated high reading of 152 mmHg noted. Advised to adjust amlodipine  based on readings. - Continue metoprolol  50 mg extended-release and amlodipine  2.5 mg daily and additional amlodipine  2.5mg  PRN - Monitor blood pressure regularly and adjust amlodipine  dosage as needed. - Attend cardiology appointment in June 2025  Cardiac Device Management Life vest returned due to discomfort and improper fit. Device check scheduled. - Attend device check on June 24th. - Follow up with cardiology  on June 30th.  General Health Maintenance Vaccinations up to date. Routine blood work typically done in December. - Schedule routine blood work for December, avoiding December 1st. - Provide results via MyChart or phone call.        No orders of the defined types were placed in this encounter.   No orders of the defined types were placed in this encounter.   Follow up plan: Return for 6 month Annual Physical AM apt fasting lab AFTER.   Domingo Friend, DO [facility] 10/16/2023, 8:45 AM

## 2023-10-16 NOTE — Patient Instructions (Addendum)
 Thank you for coming to the office today.  Keep up the good work and monitor BP readings and heart rate.  No changes today based on history  Keep up with Cardiology  Please schedule a Follow-up Appointment to: Return for 6 month Annual Physical AM apt fasting lab AFTER.  If you have any other questions or concerns, please feel free to call the office or send a message through MyChart. You may also schedule an earlier appointment if necessary.  Additionally, you may be receiving a survey about your experience at our office within a few days to 1 week by e-mail or mail. We value your feedback.  Domingo Friend, DO [facility], [place]

## 2023-10-24 ENCOUNTER — Ambulatory Visit (INDEPENDENT_AMBULATORY_CARE_PROVIDER_SITE_OTHER): Payer: Medicare HMO

## 2023-10-24 DIAGNOSIS — I441 Atrioventricular block, second degree: Secondary | ICD-10-CM

## 2023-10-24 LAB — CUP PACEART REMOTE DEVICE CHECK
Battery Remaining Longevity: 89 mo
Battery Remaining Percentage: 90 %
Battery Voltage: 3.01 V
Brady Statistic AP VP Percent: 99 %
Brady Statistic AP VS Percent: 1 %
Brady Statistic AS VP Percent: 1 %
Brady Statistic AS VS Percent: 1 %
Brady Statistic RA Percent Paced: 99 %
Brady Statistic RV Percent Paced: 99 %
Date Time Interrogation Session: 20250624032909
Implantable Lead Connection Status: 753985
Implantable Lead Connection Status: 753985
Implantable Lead Implant Date: 20240621
Implantable Lead Implant Date: 20240621
Implantable Lead Location: 753859
Implantable Lead Location: 753860
Implantable Lead Model: 3830
Implantable Pulse Generator Implant Date: 20240621
Lead Channel Impedance Value: 450 Ohm
Lead Channel Impedance Value: 610 Ohm
Lead Channel Pacing Threshold Amplitude: 0.5 V
Lead Channel Pacing Threshold Amplitude: 0.75 V
Lead Channel Pacing Threshold Pulse Width: 0.5 ms
Lead Channel Pacing Threshold Pulse Width: 0.5 ms
Lead Channel Sensing Intrinsic Amplitude: 12 mV
Lead Channel Sensing Intrinsic Amplitude: 2.7 mV
Lead Channel Setting Pacing Amplitude: 2 V
Lead Channel Setting Pacing Amplitude: 2.5 V
Lead Channel Setting Pacing Pulse Width: 0.5 ms
Lead Channel Setting Sensing Sensitivity: 2 mV
Pulse Gen Model: 2272
Pulse Gen Serial Number: 8179421

## 2023-10-27 ENCOUNTER — Other Ambulatory Visit: Payer: Self-pay
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
## 2023-10-27 NOTE — Patient Instructions (Addendum)
 Thank you for allowing the Complex Care Management team to participate in your care. It was great speaking with you today.  Keep up the great work managing your care!  We will follow up on November 24, 2023 at 1000. Please do not hesitate to contact me if you require assistance prior to our next outreach.    Jackson Acron [facility] Population Health RN Care Manager Direct Dial: [phone]  Fax: [phone] Website: [email or web address]

## 2023-10-29 ENCOUNTER — Encounter: Payer: Self-pay | Admitting: Cardiology

## 2023-10-29 NOTE — Progress Notes (Unsigned)
 Electrophysiology Clinic Note    Date:  10/30/2023  Patient ID:  Thomas [name] [birth year], MRN [id number] PCP:  Edman Marsa PARAS, DO  Cardiologist:  Evalene Lunger, MD Electrophysiologist: Elspeth Sage, MD > Sidra Kitty, MD   Discussed the use of AI scribe software for clinical note transcription with the patient, who gave verbal consent to proceed.   Patient Profile    Chief Complaint: EP follow-up  History of Present Illness: Thomas [name] is a 78 y.o. male with PMH notable for SND, 2:1 HB s/p PM, parox AFib, PVCs, VT; seen today for Elspeth Sage, MD for routine electrophysiology followup.   He was hospitalized 05/2023 for VT with syncope, Dr. Sage thought initiated by RVOT PVCs. He was initiated on amiodarone  and discharged with lifevest.  He last saw Dr. Sage 06/2023 in clinic, planning for cMRI at [facility]. Tolerating amiodarone  well.   On follow-up today, he is feeling at his baseline.  He denies chest pain, chest pressure, palpitations, shortness of breath.  He has had no further episodes of syncope.  He is tolerating amiodarone  daily without concerning off target effects.  He also takes his Eliquis  twice daily without any bleeding concerns.  He denies lower extremity edema.  He removed and returned his LifeVest about 6 weeks ago.  His wife states that it had stretched out considerably and was no longer sitting close to his body.  It also stunk.  He is joined by his wife today in clinic, who is also a patient of Dr. Celine. They request results of the cardiac MRI.     Arrhythmia/Device History St. Jude dual chamber PPM, imp 10/2022; dx high grade HB MDT RV lead   AAD -  Amiodarone       ROS:  Please see the history of present illness. All other systems are reviewed and otherwise negative.    Physical Exam    VS:  BP (!) 148/88 (BP Location: Left Arm, Patient Position: Sitting)   Pulse 66   Ht 5' 9 (1.753 m)   Wt 172 lb 9.6 oz (78.3 kg)   SpO2 96%    BMI 25.49 kg/m  BMI: Body mass index is 25.49 kg/m.      Wt Readings from Last 3 Encounters:  10/30/23 172 lb 9.6 oz (78.3 kg)  10/16/23 172 lb 6.4 oz (78.2 kg)  06/22/23 171 lb (77.6 kg)     GEN- The patient is well appearing, alert and oriented x 3 today.   Lungs- Clear to ausculation bilaterally, normal work of breathing.  Heart- Regular rate and rhythm, no murmurs, rubs or gallops Extremities- No peripheral edema, warm, dry Skin-  device pocket well-healed, no tethering   Device interrogation done today and reviewed by myself:  Battery 7+ years Lead thresholds, impedence, sensing stable  Dependent down to VVI 40 AF episodes noted, overall burden low 99% VP No VT episodes  No changes made today   Studies Reviewed   Previous EP, cardiology notes.    EKG is ordered. Personal review of EKG from today shows:    EKG Interpretation Date/Time:  Monday October 30 2023 12:59:00 EDT Ventricular Rate:  66 PR Interval:  196 QRS Duration:  160 QT Interval:  486 QTC Calculation: 509 R Axis:   -21  Text Interpretation: AV dual-paced rhythm Confirmed by Danie Hannig [phone]) on 10/30/2023 1:08:05 PM    Cardiac MRI, 09/19/2023 (via care everywhere) Mildly decreased left ventricular resting systolic function with EF of 47%.  Low normal right ventricular ejection fraction of 47%. No regional wall motion abnormality.   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 Other etiologies like as sarcoidosis in the Fabry disease are less likely.   Mild aortic and mitral regurgitation.   LHC, 05/26/2023   2nd Mrg lesion is 40% stenosed.   RPDA lesion is 20% stenosed.  TTE, 05/25/2023  1. Left ventricular ejection fraction, by estimation, is 55 to 60%. Left ventricular ejection fraction by 2D MOD biplane is 54.1 %. The left ventricle has normal function.  The left ventricle demonstrates regional wall motion abnormalities (septal wall motion consistent with conduction abnormality). Left ventricular diastolic parameters are consistent with Grade I diastolic dysfunction (impaired relaxation).   2. Right ventricular systolic function is normal. The right ventricular size is normal. There is mildly elevated pulmonary artery systolic pressure. The estimated right ventricular systolic pressure is 38.5 mmHg.   3. The mitral valve is normal in structure. Mild mitral valve regurgitation. No evidence of mitral stenosis.   4. The aortic valve is normal in structure. Aortic valve regurgitation is mild. No aortic stenosis is present.   5. The inferior vena cava is normal in size with greater than 50% respiratory variability, suggesting right atrial pressure of 3 mmHg.   6. Frequent PVCs noted.    Assessment and Plan     #) PVCs #) VT #) amio monitoring No PVCs on today's EKG or throughout device interrogation VT quiescent Patient self-discontinued his lifevest Continue amiodarone  200 mg daily Update LFTs and thyroid labs today  Will defer to MD regarding PVC ablation vs ICD implant   #) high grade AV block s/p PPM Device functioning well, see Paceart for details High VP with RV apical lead   #) parox Afib Continue to have paroxysms of afib episode, patient asymptomatic Continue amio as above  #) Hypercoag d/t parox afib CHA2DS2-VASc Score = at least 4 [CHF History: 0, HTN History: 1, Diabetes History: 0, Stroke History: 0, Vascular Disease History: 1, Age Score: 2, Gender Score: 0].  Therefore, the patient's annual risk of stroke is 4.8 %.    Stroke ppx - 5mg  eliquis  BID, appropriately dosed No bleeding concerns         Current medicines are reviewed at length with the patient today.   The patient does not have concerns regarding his medicines.  The following changes were made today:  none  Labs/ tests ordered today include:  Orders  Placed This Encounter  Procedures   Hepatic function panel   TSH   T4, free   EKG 12-Lead     Disposition: Follow up with Dr. Kennyth within 1 month , MD only     Signed, Chantal Needle, NP  10/30/23  1:38 PM  Electrophysiology [facility]

## 2023-10-30 ENCOUNTER — Ambulatory Visit: Attending: Cardiology | Admitting: Cardiology

## 2023-10-30 VITALS — BP 148/88 | HR 66 | Ht 69.0 in | Wt 172.6 lb

## 2023-10-30 DIAGNOSIS — Z5181 Encounter for therapeutic drug level monitoring: Secondary | ICD-10-CM | POA: Diagnosis not present

## 2023-10-30 DIAGNOSIS — Z950 Presence of cardiac pacemaker: Secondary | ICD-10-CM

## 2023-10-30 DIAGNOSIS — I442 Atrioventricular block, complete: Secondary | ICD-10-CM | POA: Diagnosis not present

## 2023-10-30 DIAGNOSIS — I493 Ventricular premature depolarization: Secondary | ICD-10-CM | POA: Diagnosis not present

## 2023-10-30 DIAGNOSIS — I472 Ventricular tachycardia, unspecified: Secondary | ICD-10-CM | POA: Diagnosis not present

## 2023-10-30 DIAGNOSIS — I480 Paroxysmal atrial fibrillation: Secondary | ICD-10-CM | POA: Diagnosis not present

## 2023-10-30 DIAGNOSIS — D6869 Other thrombophilia: Secondary | ICD-10-CM | POA: Diagnosis not present

## 2023-10-30 DIAGNOSIS — Z79899 Other long term (current) drug therapy: Secondary | ICD-10-CM | POA: Diagnosis not present

## 2023-10-30 LAB — CUP PACEART INCLINIC DEVICE CHECK
Battery Remaining Longevity: 86 mo
Battery Voltage: 3.01 V
Brady Statistic RA Percent Paced: 99.32 %
Brady Statistic RV Percent Paced: 99.91 %
Date Time Interrogation Session: 20250630140700
Implantable Lead Connection Status: 753985
Implantable Lead Connection Status: 753985
Implantable Lead Implant Date: 20240621
Implantable Lead Implant Date: 20240621
Implantable Lead Location: 753859
Implantable Lead Location: 753860
Implantable Lead Model: 3830
Implantable Pulse Generator Implant Date: 20240621
Lead Channel Impedance Value: 450 Ohm
Lead Channel Impedance Value: 625 Ohm
Lead Channel Pacing Threshold Amplitude: 0.75 V
Lead Channel Pacing Threshold Amplitude: 0.75 V
Lead Channel Pacing Threshold Amplitude: 0.75 V
Lead Channel Pacing Threshold Amplitude: 0.75 V
Lead Channel Pacing Threshold Pulse Width: 0.5 ms
Lead Channel Pacing Threshold Pulse Width: 0.5 ms
Lead Channel Pacing Threshold Pulse Width: 0.5 ms
Lead Channel Pacing Threshold Pulse Width: 0.5 ms
Lead Channel Sensing Intrinsic Amplitude: 2.5 mV
Lead Channel Setting Pacing Amplitude: 2 V
Lead Channel Setting Pacing Amplitude: 2.5 V
Lead Channel Setting Pacing Pulse Width: 0.5 ms
Lead Channel Setting Sensing Sensitivity: 2 mV
Pulse Gen Model: 2272
Pulse Gen Serial Number: 8179421

## 2023-10-30 NOTE — Patient Instructions (Signed)
 Medication Instructions:  The current medical regimen is effective;  continue present plan and medications as directed. Please refer to the Current Medication list given to you today.   *If you need a refill on your cardiac medications before your next appointment, please call your pharmacy*  Lab Work: Your provider would like for you to have following labs drawn today LFT, TSH, T4.   If you have labs (blood work) drawn today and your tests are completely normal, you will receive your results only by: MyChart Message (if you have MyChart) OR A paper copy in the mail If you have any lab test that is abnormal or we need to change your treatment, we will call you to review the results.   Follow-Up: At [facility], you and your health needs are our priority.  As part of our continuing mission to provide you with exceptional heart care, our providers are all part of one team.  This team includes your primary Cardiologist (physician) and Advanced Practice Providers or APPs (Physician Assistants and Nurse Practitioners) who all work together to provide you with the care you need, when you need it.  Your next appointment:   Keep scheduled appointment  We recommend signing up for the patient portal called MyChart.  Sign up information is provided on this After Visit Summary.  MyChart is used to connect with patients for Virtual Visits (Telemedicine).  Patients are able to view lab/test results, encounter notes, upcoming appointments, etc.  Non-urgent messages can be sent to your provider as well.   To learn more about what you can do with MyChart, go to [email or web address].

## 2023-10-31 ENCOUNTER — Ambulatory Visit: Payer: Self-pay | Admitting: Cardiology

## 2023-10-31 LAB — HEPATIC FUNCTION PANEL
ALT: 13 IU/L (ref 0–44)
AST: 19 IU/L (ref 0–40)
Albumin: 3.7 g/dL — ABNORMAL LOW (ref 3.8–4.8)
Alkaline Phosphatase: 51 IU/L (ref 44–121)
Bilirubin Total: 0.7 mg/dL (ref 0.0–1.2)
Bilirubin, Direct: 0.21 mg/dL (ref 0.00–0.40)
Total Protein: 7.3 g/dL (ref 6.0–8.5)

## 2023-10-31 LAB — TSH: TSH: 1.64 u[IU]/mL (ref 0.450–4.500)

## 2023-10-31 LAB — T4, FREE: Free T4: 1.77 ng/dL (ref 0.82–1.77)

## 2023-11-20 NOTE — Progress Notes (Signed)
 Electrophysiology Office Note:   Date:  11/22/2023  ID:  Thomas [name], DOB [birth year], MRN [id number]  Primary Cardiologist: Evalene Lunger, MD Electrophysiologist: Elspeth Sage, MD      History of Present Illness:   Thomas [name] is a 78 y.o. 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  He was hospitalized 05/2023 for VT with syncope, Dr. Sage thought initiated by RVOT PVCs. He was initiated on amiodarone  and discharged with lifevest.  He last saw Dr. Sage 06/2023 in clinic, planning for cMRI at [facility]. Tolerating amiodarone  well.  He discontinued LifeVest after 3 months.  He recently saw Elvie Needle on 10/30/2023 and she referred to me for further management.  Discussed the use of AI scribe software for clinical note transcription with the patient, who gave verbal consent to proceed.  History of Present Illness He has history of syncope in the setting of ventricular tachycardia.  He has done well since starting amiodarone .  He self discontinued his LifeVest and does not want to wear again.  He recently underwent MRI at [facility].  He is otherwise doing well and has no new or acute complaints today.   Review of systems complete and found to be negative unless listed in HPI.   EP Information / Studies Reviewed:    EKG is not ordered today. EKG from 10/30/23 reviewed which showed AP/VP, QRS duration of 160 ms.     LHC 05/2023:    2nd Mrg lesion is 40% stenosed.   RPDA lesion is 20% stenosed.  Echo 05/2023:   1. Left ventricular ejection fraction, by estimation, is 55 to 60%. Left  ventricular ejection fraction by 2D MOD biplane is 54.1 %. The left  ventricle has normal function. The left ventricle demonstrates regional  wall motion abnormalities (septal wall  motion consistent with conduction abnormality). Left ventricular diastolic  parameters are consistent with Grade I diastolic dysfunction  (impaired  relaxation).   2. Right ventricular systolic function is normal. The right ventricular  size is normal. There is mildly elevated pulmonary artery systolic  pressure. The estimated right ventricular systolic pressure is 38.5 mmHg.   3. The mitral valve is normal in structure. Mild mitral valve  regurgitation. No evidence of mitral stenosis.   4. The aortic valve is normal in structure. Aortic valve regurgitation is  mild. No aortic stenosis is present.   5. The inferior vena cava is normal in size with greater than 50%  respiratory variability, suggesting right atrial pressure of 3 mmHg.   6. Frequent PVCs noted.   Cardiac MRI 08/2023:  Mildly decreased left ventricular resting systolic function with EF of 47%. Low normal right ventricular ejection fraction of 47%. No regional wall motion abnormality.   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 Other etiologies like as sarcoidosis in the Fabry disease are less likely.   Mild aortic and mitral regurgitation.    Physical Exam:   VS:  BP 130/66 (BP Location: Left Arm, Patient Position: Sitting, Cuff Size: Normal)   Pulse 67   Ht 5' 9.5 (1.765 m)   Wt 170 lb 12.8 oz (77.5 kg)   SpO2 96%   BMI 24.86 kg/m    Wt Readings from Last 3 Encounters:  11/21/23 170 lb 12.8 oz (77.5 kg)  10/30/23 172 lb 9.6 oz (  78.3 kg)  10/16/23 172 lb 6.4 oz (78.2 kg)     GEN: Well nourished, well developed in no acute distress NECK: No JVD CARDIAC: Normal rate, regular rhythm RESPIRATORY:  Clear to auscultation without rales, wheezing or rhonchi  ABDOMEN: Soft, non-distended EXTREMITIES:  No edema; No deformity   ASSESSMENT AND PLAN:    Complex history since January 2025.  Patient has prior dual-chamber pacemaker in the setting of secondary AV block that is since progressed to complete AV block.  He  developed ventricular tachycardia early this year associated with syncope.  At that time there was discussion about ICD implant versus PVC ablation, as Dr. Fernande had some suspicion that PVC had initiated tachycardia.  He was sent for cardiac MRI which showed presence of late gadolinium enhancement -patchy mid and subepicardial in the inferolateral basal and mid cavity segments  - and mildly reduced LVEF.  He has remained on amiodarone  since January and has not had recurrence of arrhythmia.  # Sustained ventricular tachycardia: Associated with syncope.  Given presence of scar on MRI and mildly reduced LVEF, he meets criteria for secondary prevention ICD. - We discussed options regarding upgrading his current device to a CRT-D.  His device is currently not MRI compatible and his left bundle lead, which is a Medtronic 3830, does not appear to be capturing the conduction system.  His QRS is 160 ms in duration with a left bundle branch block morphology.  We discussed extracting this lead and implanting either a new lead in the left bundle branch position or CS lead.  With using an Abbott lead in its place, we would restore MRI compatibility.  Would also implanted Abbott ICD lead at that time.  Patient is understanding of the risk associated with extraction including need for emergent cardiac surgery or even death.  Patient voiced understanding and would like to proceed as planned.  We will arrange for extraction to be performed with cardiac surgery backup. - Continue medical suppression of ventricular arrhythmias with amiodarone .  Once ICD is in place, we can consider weaning of amiodarone  with close monitoring.  # CHB s/p pacemaker:  - In-clinic device interrogation was performed today, appropriate device function and stable lead parameters.  Battery status okay.  Presenting rhythm is AP/VP.  No recurrent ventricular arrhythmias have been recorded.  No programming changes.  Continue remote monitoring. - Lead  revision as noted above.  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  Other explanation would be for nonischemic cardiomyopathy given presence of LGE on his MRI.  He appears well compensated on exam today. - We will upgrade to CRT-D as mentioned above.  If LVEF remains reduced following that, then we will refer to our heart failure colleagues to assist with management.   Follow up with Dr. Kennyth after device extraction.   Signed, Fonda Kennyth, MD

## 2023-11-21 ENCOUNTER — Encounter: Payer: Self-pay | Admitting: Cardiology

## 2023-11-21 ENCOUNTER — Ambulatory Visit: Attending: Cardiology | Admitting: Cardiology

## 2023-11-21 VITALS — BP 130/66 | HR 67 | Ht 69.5 in | Wt 170.8 lb

## 2023-11-21 DIAGNOSIS — I442 Atrioventricular block, complete: Secondary | ICD-10-CM | POA: Diagnosis not present

## 2023-11-21 DIAGNOSIS — I472 Ventricular tachycardia, unspecified: Secondary | ICD-10-CM | POA: Diagnosis not present

## 2023-11-21 DIAGNOSIS — R55 Syncope and collapse: Secondary | ICD-10-CM

## 2023-11-21 DIAGNOSIS — Z950 Presence of cardiac pacemaker: Secondary | ICD-10-CM | POA: Diagnosis not present

## 2023-11-21 DIAGNOSIS — I5022 Chronic systolic (congestive) heart failure: Secondary | ICD-10-CM

## 2023-11-24 ENCOUNTER — Other Ambulatory Visit: Payer: Self-pay

## 2023-11-28 DIAGNOSIS — K006 Disturbances in tooth eruption: Secondary | ICD-10-CM | POA: Diagnosis not present

## 2023-11-28 DIAGNOSIS — Z0120 Encounter for dental examination and cleaning without abnormal findings: Secondary | ICD-10-CM | POA: Diagnosis not present

## 2023-11-28 DIAGNOSIS — K011 Impacted teeth: Secondary | ICD-10-CM | POA: Diagnosis not present

## 2023-11-29 NOTE — Patient Instructions (Signed)
 Thank you for allowing the Complex Care Management team to participate in your care. It was great speaking with you!  We will follow up on December 28, 2023 at 1 pm. Please do not hesitate to contact me if you require assistance prior to our next outreach.    Jackson Acron [facility] Population Health RN Care Manager Direct Dial: [phone]  Fax: [phone] Website: [email or web address]
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
## 2023-12-11 ENCOUNTER — Telehealth: Payer: Self-pay

## 2023-12-11 ENCOUNTER — Other Ambulatory Visit: Payer: Self-pay

## 2023-12-11 DIAGNOSIS — I480 Paroxysmal atrial fibrillation: Secondary | ICD-10-CM

## 2023-12-11 DIAGNOSIS — I5022 Chronic systolic (congestive) heart failure: Secondary | ICD-10-CM

## 2023-12-11 NOTE — Telephone Encounter (Signed)
 Spoke with pt's Wife (he was sitting beside her) scheduled him for RV lead extraction with PPM upgrade to CRT-D on 10/17 at 2:00 pm with Dr. Kennyth.   He will have updated labs done on 9/26.   I will mail instruction letter to pt once completed.   Dr. Shyrl will be back up Surgeon.

## 2023-12-12 ENCOUNTER — Other Ambulatory Visit: Payer: Self-pay | Admitting: Internal Medicine

## 2023-12-15 ENCOUNTER — Other Ambulatory Visit: Payer: Self-pay | Admitting: Internal Medicine

## 2023-12-28 ENCOUNTER — Other Ambulatory Visit: Payer: Self-pay

## 2023-12-29 NOTE — Patient Instructions (Signed)
 Thank you for allowing the Complex Care Management team to participate in your care. It was great speaking with you!  We will follow up on January 18, 2024 at 1 pm. Please do not hesitate to contact me if you require assistance prior to our next outreach.   Jackson Acron [facility] Population Health RN Care Manager Direct Dial: [phone]  Fax: [phone] Website: [email or web address]
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
## 2024-01-18 ENCOUNTER — Telehealth: Payer: Self-pay

## 2024-01-23 ENCOUNTER — Ambulatory Visit (INDEPENDENT_AMBULATORY_CARE_PROVIDER_SITE_OTHER): Payer: Medicare HMO

## 2024-01-23 ENCOUNTER — Telehealth (HOSPITAL_COMMUNITY): Payer: Self-pay

## 2024-01-23 DIAGNOSIS — I5022 Chronic systolic (congestive) heart failure: Secondary | ICD-10-CM | POA: Diagnosis not present

## 2024-01-23 LAB — CUP PACEART REMOTE DEVICE CHECK
Battery Remaining Longevity: 86 mo
Battery Remaining Percentage: 87 %
Battery Voltage: 3.01 V
Brady Statistic AP VP Percent: 99 %
Brady Statistic AP VS Percent: 1 %
Brady Statistic AS VP Percent: 1 %
Brady Statistic AS VS Percent: 1 %
Brady Statistic RA Percent Paced: 99 %
Brady Statistic RV Percent Paced: 99 %
Date Time Interrogation Session: 20250923020015
Implantable Lead Connection Status: 753985
Implantable Lead Connection Status: 753985
Implantable Lead Implant Date: 20240621
Implantable Lead Implant Date: 20240621
Implantable Lead Location: 753859
Implantable Lead Location: 753860
Implantable Lead Model: 3830
Implantable Pulse Generator Implant Date: 20240621
Lead Channel Impedance Value: 430 Ohm
Lead Channel Impedance Value: 610 Ohm
Lead Channel Pacing Threshold Amplitude: 0.75 V
Lead Channel Pacing Threshold Amplitude: 0.75 V
Lead Channel Pacing Threshold Pulse Width: 0.5 ms
Lead Channel Pacing Threshold Pulse Width: 0.5 ms
Lead Channel Sensing Intrinsic Amplitude: 1.5 mV
Lead Channel Sensing Intrinsic Amplitude: 12 mV
Lead Channel Setting Pacing Amplitude: 2 V
Lead Channel Setting Pacing Amplitude: 2.5 V
Lead Channel Setting Pacing Pulse Width: 0.5 ms
Lead Channel Setting Sensing Sensitivity: 4 mV
Pulse Gen Model: 2272
Pulse Gen Serial Number: 8179421

## 2024-01-23 NOTE — Telephone Encounter (Signed)
 Spoke with patient's wife [name] to complete pre-procedure call/ DPR on file.     Health status review:  Any new medical conditions, recent signs of acute illness or been started on antibiotics? No Any recent hospitalizations or surgeries? No Any new medications started since pre-op  visit? No  Follow all medication instructions prior to procedure or the procedure may be rescheduled:    HOLD: Eliquis  (Apixaban ) for 2 day(s) prior to your procedure. Your last dose will be Tuesday, October 14, PM dose. Essential chronic medications:  No medication should be continued, unless told otherwise. On the morning of your procedure DO NOT take any medication. The night before your procedure and the morning of your procedure, wash thoroughly with the CHG surgical soap from the neck down, paying special attention to the area where your procedure will be performed.  Nothing to eat or drink after midnight prior to your procedure.  Pre-procedure testing scheduled: lab work on September 26.  Confirmed patient is scheduled for Lead Extraction, PPM Generator Removal, BiVentricular Implantable Cardiac Defibrillator on Friday, October 17 with Dr. Sidra Kitty. Instructed patient to arrive at the Main Entrance A at [facility]: [address] [place], [place] [address] and check in at Admitting at 12:30 PM.  Advised of plan to go home the same day and will only stay overnight if medically necessary. You MUST have a responsible adult to drive you home and MUST be with you the first 24 hours after you arrive home or your procedure could be cancelled.  Informed patient a nurse will call a day before the procedure to confirm arrival time and ensure instructions are followed.  Patient verbalized understanding to information provided and is agreeable to proceed with procedure.   Advised patient to contact RN Navigator at [phone], to inform of any new medications started after call or concerns prior to  procedure.

## 2024-01-24 NOTE — Progress Notes (Signed)
 Remote PPM Transmission

## 2024-01-26 DIAGNOSIS — I480 Paroxysmal atrial fibrillation: Secondary | ICD-10-CM | POA: Diagnosis not present

## 2024-01-26 DIAGNOSIS — I5022 Chronic systolic (congestive) heart failure: Secondary | ICD-10-CM | POA: Diagnosis not present

## 2024-01-27 ENCOUNTER — Ambulatory Visit: Payer: Self-pay | Admitting: Cardiology

## 2024-01-27 LAB — BASIC METABOLIC PANEL WITH GFR
BUN/Creatinine Ratio: 17 (ref 10–24)
BUN: 20 mg/dL (ref 8–27)
CO2: 20 mmol/L (ref 20–29)
Calcium: 8.9 mg/dL (ref 8.6–10.2)
Chloride: 93 mmol/L — ABNORMAL LOW (ref 96–106)
Creatinine, Ser: 1.18 mg/dL (ref 0.76–1.27)
Glucose: 151 mg/dL — ABNORMAL HIGH (ref 70–99)
Potassium: 4.3 mmol/L (ref 3.5–5.2)
Sodium: 135 mmol/L (ref 134–144)
eGFR: 62 mL/min/1.73 (ref >59)

## 2024-01-27 LAB — CBC
Hematocrit: 38 % (ref 37.5–51.0)
Hemoglobin: 12.9 g/dL — ABNORMAL LOW (ref 13.0–17.7)
MCH: 33.9 pg — ABNORMAL HIGH (ref 26.6–33.0)
MCHC: 33.9 g/dL (ref 31.5–35.7)
MCV: 100 fL — ABNORMAL HIGH (ref 79–97)
Platelets: 175 x10E3/uL (ref 150–450)
RBC: 3.8 x10E6/uL — ABNORMAL LOW (ref 4.14–5.80)
RDW: 14 % (ref 11.6–15.4)
WBC: 4.3 x10E3/uL (ref 3.4–10.8)

## 2024-01-28 ENCOUNTER — Ambulatory Visit: Payer: Self-pay | Admitting: Cardiology

## 2024-02-15 ENCOUNTER — Other Ambulatory Visit: Payer: Self-pay

## 2024-02-15 NOTE — Patient Instructions (Addendum)
 Thank you for allowing the Complex Care Management team to participate in your care. It was great speaking with you!  We will follow up on March 01, 2024 at 12:30. Please do not hesitate to contact me if you require assistance prior to our next outreach.    Jackson Acron [facility] RN Care Manager Direct Dial: [phone]  Fax: [phone] Website: [email or web address]
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
## 2024-02-16 ENCOUNTER — Ambulatory Visit (HOSPITAL_COMMUNITY)

## 2024-02-16 ENCOUNTER — Ambulatory Visit (HOSPITAL_COMMUNITY): Payer: Self-pay | Admitting: Certified Registered Nurse Anesthetist

## 2024-02-16 ENCOUNTER — Encounter (HOSPITAL_COMMUNITY): Payer: Self-pay | Admitting: Cardiology

## 2024-02-16 ENCOUNTER — Observation Stay (HOSPITAL_COMMUNITY)
Admission: RE | Admit: 2024-02-16 | Discharge: 2024-02-17 | Disposition: A | Discharge status: Home / Self Care | Attending: Cardiology | Admitting: Cardiology

## 2024-02-16 ENCOUNTER — Ambulatory Visit (HOSPITAL_COMMUNITY)
Admission: RE | Disposition: A | Discharge status: Home / Self Care | Payer: Self-pay | Source: Home / Self Care | Attending: Cardiology

## 2024-02-16 DIAGNOSIS — T82120A Displacement of cardiac electrode, initial encounter: Secondary | ICD-10-CM | POA: Diagnosis not present

## 2024-02-16 DIAGNOSIS — Z950 Presence of cardiac pacemaker: Secondary | ICD-10-CM | POA: Diagnosis not present

## 2024-02-16 DIAGNOSIS — I110 Hypertensive heart disease with heart failure: Secondary | ICD-10-CM | POA: Diagnosis not present

## 2024-02-16 DIAGNOSIS — Y712 Prosthetic and other implants, materials and accessory cardiovascular devices associated with adverse incidents: Secondary | ICD-10-CM | POA: Diagnosis not present

## 2024-02-16 DIAGNOSIS — I471 Supraventricular tachycardia, unspecified: Secondary | ICD-10-CM

## 2024-02-16 DIAGNOSIS — Z01818 Encounter for other preprocedural examination: Principal | ICD-10-CM

## 2024-02-16 DIAGNOSIS — I5021 Acute systolic (congestive) heart failure: Secondary | ICD-10-CM | POA: Diagnosis not present

## 2024-02-16 DIAGNOSIS — I472 Ventricular tachycardia, unspecified: Secondary | ICD-10-CM | POA: Diagnosis present

## 2024-02-16 DIAGNOSIS — I428 Other cardiomyopathies: Secondary | ICD-10-CM | POA: Insufficient documentation

## 2024-02-16 DIAGNOSIS — I5022 Chronic systolic (congestive) heart failure: Secondary | ICD-10-CM | POA: Diagnosis not present

## 2024-02-16 DIAGNOSIS — I5020 Unspecified systolic (congestive) heart failure: Secondary | ICD-10-CM | POA: Diagnosis not present

## 2024-02-16 DIAGNOSIS — Z87891 Personal history of nicotine dependence: Secondary | ICD-10-CM | POA: Insufficient documentation

## 2024-02-16 DIAGNOSIS — T82110A Breakdown (mechanical) of cardiac electrode, initial encounter: Secondary | ICD-10-CM | POA: Insufficient documentation

## 2024-02-16 HISTORY — PX: LEAD EXTRACTION: EP1211

## 2024-02-16 LAB — TYPE AND SCREEN
ABO/RH(D): B POS
Antibody Screen: NEGATIVE
Unit division: 0
Unit division: 0

## 2024-02-16 LAB — BASIC METABOLIC PANEL WITH GFR
Anion gap: 14 (ref 5–15)
BUN: 14 mg/dL (ref 8–23)
CO2: 22 mmol/L (ref 22–32)
Calcium: 8.8 mg/dL — ABNORMAL LOW (ref 8.9–10.3)
Chloride: 99 mmol/L (ref 98–111)
Creatinine, Ser: 1.04 mg/dL (ref 0.61–1.24)
GFR, Estimated: >60 mL/min (ref >60)
Glucose, Bld: 98 mg/dL (ref 70–99)
Potassium: 4.4 mmol/L (ref 3.5–5.1)
Sodium: 135 mmol/L (ref 135–145)

## 2024-02-16 LAB — SURGICAL PCR SCREEN
MRSA, PCR: NEGATIVE
Staphylococcus aureus: NEGATIVE

## 2024-02-16 LAB — BPAM RBC
Blood Product Expiration Date: 202511202359
Blood Product Expiration Date: 202511202359
ISSUE DATE / TIME: 202510171427
ISSUE DATE / TIME: 202510171427
Unit Type and Rh: 7300
Unit Type and Rh: 7300

## 2024-02-16 LAB — CBC
HCT: 40.2 % (ref 39.0–52.0)
Hemoglobin: 13.5 g/dL (ref 13.0–17.0)
MCH: 33.5 pg (ref 26.0–34.0)
MCHC: 33.6 g/dL (ref 30.0–36.0)
MCV: 99.8 fL (ref 80.0–100.0)
Platelets: 170 K/uL (ref 150–400)
RBC: 4.03 MIL/uL — ABNORMAL LOW (ref 4.22–5.81)
RDW: 14.2 % (ref 11.5–15.5)
WBC: 5.2 K/uL (ref 4.0–10.5)
nRBC: 0 % (ref 0.0–0.2)

## 2024-02-16 LAB — ECHO INTRAOPERATIVE TEE
Height: 69.5 in
Weight: 2560 [oz_av]

## 2024-02-16 LAB — ABO/RH: ABO/RH(D): B POS

## 2024-02-16 LAB — PREPARE RBC (CROSSMATCH)

## 2024-02-16 SURGERY — LEAD EXTRACTION
Anesthesia: General

## 2024-02-16 MED ORDER — AMLODIPINE BESYLATE 5 MG PO TABS
2.5000 mg | ORAL_TABLET | Freq: Every day | ORAL | Status: DC
  Administered 2024-02-17: 2.5 mg via ORAL
  Filled 2024-02-16: qty 1

## 2024-02-16 MED ORDER — ONDANSETRON HCL 4 MG/2ML IJ SOLN
4.0000 mg | Freq: Four times a day (QID) | INTRAMUSCULAR | Status: DC | PRN
  Administered 2024-02-16: 4 mg via INTRAVENOUS
  Filled 2024-02-16: qty 2

## 2024-02-16 MED ORDER — CEFAZOLIN SODIUM-DEXTROSE 2-4 GM/100ML-% IV SOLN
INTRAVENOUS | Status: AC
  Filled 2024-02-16: qty 100

## 2024-02-16 MED ORDER — SODIUM CHLORIDE 0.9% IV SOLUTION: Freq: Once | INTRAVENOUS | Status: AC

## 2024-02-16 MED ORDER — SUGAMMADEX SODIUM 200 MG/2ML IV SOLN
INTRAVENOUS | Status: DC | PRN
  Administered 2024-02-16: 200 mg via INTRAVENOUS

## 2024-02-16 MED ORDER — SODIUM CHLORIDE 0.9 % IV SOLN: INTRAVENOUS | Status: DC

## 2024-02-16 MED ORDER — PROPOFOL 10 MG/ML IV BOLUS
INTRAVENOUS | Status: DC | PRN
  Administered 2024-02-16: 100 mg via INTRAVENOUS

## 2024-02-16 MED ORDER — FENTANYL CITRATE (PF) 250 MCG/5ML IJ SOLN
INTRAMUSCULAR | Status: DC | PRN
  Administered 2024-02-16 (×2): 50 ug via INTRAVENOUS

## 2024-02-16 MED ORDER — ROSUVASTATIN CALCIUM 5 MG PO TABS
5.0000 mg | ORAL_TABLET | ORAL | Status: DC
  Administered 2024-02-16: 5 mg via ORAL
  Filled 2024-02-16: qty 1

## 2024-02-16 MED ORDER — EPHEDRINE SULFATE-NACL 50-0.9 MG/10ML-% IV SOSY
PREFILLED_SYRINGE | INTRAVENOUS | Status: DC | PRN
  Administered 2024-02-16: 10 mg via INTRAVENOUS

## 2024-02-16 MED ORDER — METOPROLOL SUCCINATE ER 25 MG PO TB24: 50.0000 mg | ORAL_TABLET | Freq: Every day | ORAL | Status: DC

## 2024-02-16 MED ORDER — LACTATED RINGERS IV SOLN: INTRAVENOUS | Status: DC

## 2024-02-16 MED ORDER — GENTAMICIN SULFATE 40 MG/ML IJ SOLN: 80.0000 mg | INTRAMUSCULAR | Status: DC

## 2024-02-16 MED ORDER — ROCURONIUM BROMIDE 10 MG/ML (PF) SYRINGE
PREFILLED_SYRINGE | INTRAVENOUS | Status: DC | PRN
  Administered 2024-02-16: 50 mg via INTRAVENOUS

## 2024-02-16 MED ORDER — SODIUM CHLORIDE 0.9 % IV SOLN
INTRAVENOUS | Status: AC
  Filled 2024-02-16: qty 2

## 2024-02-16 MED ORDER — CHLORHEXIDINE GLUCONATE 0.12 % MT SOLN
OROMUCOSAL | Status: AC
  Administered 2024-02-16: 15 mL
  Filled 2024-02-16: qty 15

## 2024-02-16 MED ORDER — POVIDONE-IODINE 10 % EX SWAB
2.0000 | Freq: Once | CUTANEOUS | Status: AC
  Administered 2024-02-16: 2 via TOPICAL

## 2024-02-16 MED ORDER — PHENYLEPHRINE 80 MCG/ML (10ML) SYRINGE FOR IV PUSH (FOR BLOOD PRESSURE SUPPORT)
PREFILLED_SYRINGE | INTRAVENOUS | Status: DC | PRN
  Administered 2024-02-16 (×2): 80 ug via INTRAVENOUS
  Administered 2024-02-16: 160 ug via INTRAVENOUS

## 2024-02-16 MED ORDER — ACETAMINOPHEN 500 MG PO TABS
1000.0000 mg | ORAL_TABLET | Freq: Once | ORAL | Status: AC
  Administered 2024-02-16: 1000 mg via ORAL
  Filled 2024-02-16: qty 2

## 2024-02-16 MED ORDER — HEPARIN (PORCINE) IN NACL 1000-0.9 UT/500ML-% IV SOLN
INTRAVENOUS | Status: DC | PRN
  Administered 2024-02-16: 500 mL

## 2024-02-16 MED ORDER — PHENYLEPHRINE HCL-NACL 20-0.9 MG/250ML-% IV SOLN
INTRAVENOUS | Status: DC | PRN
  Administered 2024-02-16: 25 ug/min via INTRAVENOUS

## 2024-02-16 MED ORDER — AMIODARONE HCL 200 MG PO TABS
200.0000 mg | ORAL_TABLET | Freq: Every day | ORAL | Status: DC
  Administered 2024-02-17: 200 mg via ORAL
  Filled 2024-02-16: qty 1

## 2024-02-16 MED ORDER — LIDOCAINE 2% (20 MG/ML) 5 ML SYRINGE
INTRAMUSCULAR | Status: DC | PRN
  Administered 2024-02-16: 60 mg via INTRAVENOUS

## 2024-02-16 MED ORDER — IOHEXOL 350 MG/ML SOLN
INTRAVENOUS | Status: DC | PRN
  Administered 2024-02-16: 10 mL

## 2024-02-16 MED ORDER — CEFAZOLIN SODIUM-DEXTROSE 2-4 GM/100ML-% IV SOLN
2.0000 g | INTRAVENOUS | Status: AC
  Administered 2024-02-16: 2 g via INTRAVENOUS

## 2024-02-16 MED ORDER — FENTANYL CITRATE (PF) 100 MCG/2ML IJ SOLN
INTRAMUSCULAR | Status: AC
  Filled 2024-02-16: qty 2

## 2024-02-16 MED ORDER — ACETAMINOPHEN 325 MG PO TABS
325.0000 mg | ORAL_TABLET | ORAL | Status: DC | PRN
  Administered 2024-02-16: 650 mg via ORAL
  Filled 2024-02-16: qty 2

## 2024-02-16 MED ORDER — CHLORHEXIDINE GLUCONATE 4 % EX SOLN
4.0000 | Freq: Once | CUTANEOUS | Status: DC
  Filled 2024-02-16: qty 60

## 2024-02-16 MED ORDER — ONDANSETRON HCL 4 MG/2ML IJ SOLN
INTRAMUSCULAR | Status: DC | PRN
  Administered 2024-02-16: 4 mg via INTRAVENOUS

## 2024-02-16 SURGICAL SUPPLY — 21 items
BALLOON COR SINUS VENO 6FR 80 (BALLOONS) IMPLANT
CABLE SURGICAL S-101-97-12 (CABLE) ×1 IMPLANT
CATH CPS DIRECT 135 DS2C020 (CATHETERS) IMPLANT
CATH JOSEPH QUAD ALLRED 6F REP (CATHETERS) IMPLANT
CLOSURE MYNX CONTROL 6F/7F (Vascular Products) IMPLANT
CLOSURE VENOUSE MYNX (Vascular Products) IMPLANT
ICD GALLANT HFCRTD CDHFA500Q (ICD Generator) IMPLANT
LEAD DURATA 7122Q-65CM (Lead) IMPLANT
LEAD QUARTET 1458Q-86CM (Lead) IMPLANT
PAD DEFIB RADIO PHYSIO CONN (PAD) ×1 IMPLANT
POUCH AIGIS-R ANTIBACT ICD LRG (Mesh General) IMPLANT
SHEATH 7FR PRELUDE SNAP 13 (SHEATH) IMPLANT
SHEATH 9.5FR PRELUDE SNAP 13 (SHEATH) IMPLANT
SHEATH FAST CATH 12F 12CM (SHEATH) IMPLANT
SHEATH PINNACLE 6F 10CM (SHEATH) IMPLANT
SHEATH PROBE COVER 6X72 (BAG) IMPLANT
SLITTER AGILIS HISPRO (INSTRUMENTS) IMPLANT
TRAY PACEMAKER INSERTION (PACKS) ×1 IMPLANT
WIRE ACUITY WHISPER EDS 4648 (WIRE) IMPLANT
WIRE HI TORQ VERSACORE-J 145CM (WIRE) IMPLANT
WIRE MICRO SET SILHO 5FR 7 (SHEATH) IMPLANT

## 2024-02-16 NOTE — Anesthesia Procedure Notes (Signed)
 Procedure Name: Intubation Date/Time: 02/16/2024 2:52 PM  Performed by: Mannie Krystal LABOR, CRNAPre-anesthesia Checklist: Patient identified, Emergency Drugs available, Suction available and Patient being monitored Patient Re-evaluated:Patient Re-evaluated prior to induction Oxygen Delivery Method: Circle system utilized Preoxygenation: Pre-oxygenation with 100% oxygen Induction Type: IV induction Ventilation: Mask ventilation without difficulty and Oral airway inserted - appropriate to patient size Laryngoscope Size: Mac and 4 Tube type: Oral Tube size: 8.0 mm Number of attempts: 1 Airway Equipment and Method: Stylet and Oral airway Placement Confirmation: ETT inserted through vocal cords under direct vision, positive ETCO2 and breath sounds checked- equal and bilateral Secured at: 22 cm Tube secured with: Tape Dental Injury: Teeth and Oropharynx as per pre-operative assessment

## 2024-02-16 NOTE — Transfer of Care (Signed)
 Immediate Anesthesia Transfer of Care Note  Patient: Thomas [name]  Procedure(s) Performed: LEAD EXTRACTION  Patient Location: PACU  Anesthesia Type:General  Level of Consciousness: awake, alert , and oriented  Airway & Oxygen Therapy: Patient Spontanous Breathing  Post-op Assessment: Report given to RN and Post -op Vital signs reviewed and stable  Post vital signs: Reviewed and stable  Last Vitals:  Vitals Value Taken Time  BP    Temp    Pulse 73 02/16/24 17:40  Resp 12 02/16/24 17:40  SpO2 97 % 02/16/24 17:40  Vitals shown include unfiled device data.  Last Pain:  Vitals:   02/16/24 1328  TempSrc:   PainSc: 0-No pain         Complications: No notable events documented.

## 2024-02-16 NOTE — Progress Notes (Signed)
 Ordered pt a Supper tray.
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
## 2024-02-16 NOTE — Anesthesia Procedure Notes (Signed)
 Arterial Line Insertion Start/End10/17/2025 2:15 PM, 02/16/2024 2:25 PM Performed by: Claudene Arlin LABOR, CRNA, CRNA  Patient location: Pre-op . Preanesthetic checklist: patient identified, IV checked, site marked, risks and benefits discussed, surgical consent, monitors and equipment checked, pre-op  evaluation, timeout performed and anesthesia consent Lidocaine  1% used for infiltration Right, radial was placed Catheter size: 20 G Hand hygiene performed  and maximum sterile barriers used  Allen's test indicative of satisfactory collateral circulation Attempts: 1 Following insertion, dressing applied and Biopatch. Post procedure assessment: normal and unchanged  Patient tolerated the procedure well with no immediate complications.

## 2024-02-16 NOTE — Discharge Instructions (Addendum)
 After Your ICD (Implantable Cardiac Defibrillator)   You have a Abbott ICD  If you have a Medtronic or Biotronik device, plug in your home monitor once you get home, and no manual interaction is required.   If you have an [employer] or [employer] device, plug your home monitor once you get home, sit near the device, and press the large activation button. Sit nearby until the process is complete, usually notated by lights on the monitor.   If you were set up for monitoring using an app on your phone, make sure the app remains open in the background and the Bluetooth remains on.  ACTIVITY Do not lift your arm above shoulder height for 1 week after your procedure. After 7 days, you may progress as below.  You should remove your sling 24 hours after your procedure, unless otherwise instructed by your provider.     Friday February 23, 2024  Saturday February 24, 2024 Sunday February 25, 2024 Monday February 26, 2024   Do not lift, push, pull, or carry anything over 10 pounds with the affected arm until 6 weeks (Friday March 29, 2024 ) after your procedure.   You may drive AFTER your wound check, unless you have been told otherwise by your provider.   Ask your healthcare provider when you can go back to work   INCISION/Dressing Hold Eliquis  post procedure > OK to restart on 02/21/24 am   If large square, outer bandage is left in place, this can be removed after 24 hours from your procedure. Do not remove steri-strips or glue as below.   Monitor your defibrillator site for redness, swelling, and drainage. Call the device clinic at [phone] if you experience these symptoms or fever/chills.  If your incision is sealed with Steri-strips or staples, you may shower 7 days after your procedure or when told by your provider. Do not remove the steri-strips or let the shower hit directly on your site. You may wash around your site with soap and water.    If you were discharged in a sling,  please do not wear this during the day more than 48 hours after your surgery unless otherwise instructed. This may increase the risk of stiffness and soreness in your shoulder.   Avoid lotions, ointments, or perfumes over your incision until it is well-healed.  You may use a hot tub or a pool AFTER your wound check appointment if the incision is completely closed.  Your ICD is designed to protect you from life threatening heart rhythms. Because of this, you may receive a shock.   1 shock with no symptoms:  Call the office during business hours. 1 shock with symptoms (chest pain, chest pressure, dizziness, lightheadedness, shortness of breath, overall feeling unwell):  Call 911. If you experience 2 or more shocks in 24 hours:  Call 911. If you receive a shock, you should not drive for 6 months per the [place] DMV IF you receive appropriate therapy from your ICD.   ICD Alerts:  Some alerts are vibratory and others beep. These are NOT emergencies. Please call our office to let us  know. If this occurs at night or on weekends, it can wait until the next business day. Send a remote transmission.  If your device is capable of reading fluid status (for heart failure), you will be offered monthly monitoring to review this with you.   DEVICE MANAGEMENT Remote monitoring is used to monitor your ICD from home. This monitoring is scheduled every 91  days by our office. It allows us  to keep an eye on the functioning of your device to ensure it is working properly. You will routinely see your Electrophysiologist annually (more often if necessary). This will appear as a REMOTE check on your MyChart schedule. These are automatic and there is nothing for you to manually do unless otherwise instructed.  You should receive your ID card for your new device in 4-8 weeks. Keep this card with you at all times once received. Consider wearing a medical alert bracelet or necklace.  Your ICD  may be MRI compatible. This will be  discussed at your next office visit/wound check.  You should avoid contact with strong electric or magnetic fields.   Do not use amateur (ham) radio equipment or electric (arc) welding torches. MP3 player headphones with magnets should not be used. Some devices are safe to use if held at least 12 inches (30 cm) from your defibrillator. These include power tools, lawn mowers, and speakers. If you are unsure if something is safe to use, ask your health care provider.  When using your cell phone, hold it to the ear that is on the opposite side from the defibrillator. Do not leave your cell phone in a pocket over the defibrillator.  You may safely use electric blankets, heating pads, computers, and microwave ovens.  Call the office right away if: You have chest pain. You feel more than one shock. You feel more short of breath than you have felt before. You feel more light-headed than you have felt before. Your incision starts to open up.  This information is not intended to replace advice given to you by your health care provider. Make sure you discuss any questions you have with your health care provider.

## 2024-02-16 NOTE — Anesthesia Preprocedure Evaluation (Addendum)
 Anesthesia Evaluation  Patient identified by MRN, date of birth, ID band Patient awake    Reviewed: Allergy & Precautions, H&P , NPO status , Patient's Chart, lab work & pertinent test results  Airway Mallampati: II  TM Distance: >3 FB Neck ROM: Full    Dental no notable dental hx. (+) Teeth Intact, Dental Advisory Given   Pulmonary former smoker   Pulmonary exam normal breath sounds clear to auscultation       Cardiovascular hypertension, + dysrhythmias Atrial Fibrillation + pacemaker  Rhythm:Irregular Rate:Normal     Neuro/Psych   Anxiety     negative neurological ROS     GI/Hepatic negative GI ROS, Neg liver ROS,,,  Endo/Other  negative endocrine ROS    Renal/GU negative Renal ROS  negative genitourinary   Musculoskeletal  (+) Arthritis , Osteoarthritis,    Abdominal   Peds  Hematology negative hematology ROS (+)   Anesthesia Other Findings   Reproductive/Obstetrics negative OB ROS                              Anesthesia Physical Anesthesia Plan  ASA: 3  Anesthesia Plan: General   Post-op Pain Management: Tylenol  PO (pre-op )*   Induction: Intravenous  PONV Risk Score and Plan: 3 and Ondansetron , Dexamethasone  and Treatment may vary due to age or medical condition  Airway Management Planned: Oral ETT  Additional Equipment: Arterial line  Intra-op Plan:   Post-operative Plan: Extubation in OR  Informed Consent: I have reviewed the patients History and Physical, chart, labs and discussed the procedure including the risks, benefits and alternatives for the proposed anesthesia with the patient or authorized representative who has indicated his/her understanding and acceptance.     Dental advisory given  Plan Discussed with: CRNA  Anesthesia Plan Comments:          Anesthesia Quick Evaluation

## 2024-02-16 NOTE — Progress Notes (Signed)
 Orthopedic Tech Progress Note Patient Details:  Thomas [name] 1946-04-03 [id number]  Patient ID: [name], male   DOB: 01-25-1946, 78 y.o.   MRN: [id number] Pt. Has sling. [name] 02/16/2024, 7:20 PM

## 2024-02-17 ENCOUNTER — Encounter (HOSPITAL_COMMUNITY): Payer: Self-pay | Admitting: Cardiology

## 2024-02-17 ENCOUNTER — Other Ambulatory Visit: Payer: Self-pay

## 2024-02-17 ENCOUNTER — Inpatient Hospital Stay (HOSPITAL_COMMUNITY)

## 2024-02-17 DIAGNOSIS — I110 Hypertensive heart disease with heart failure: Secondary | ICD-10-CM | POA: Diagnosis not present

## 2024-02-17 DIAGNOSIS — Z87891 Personal history of nicotine dependence: Secondary | ICD-10-CM | POA: Diagnosis not present

## 2024-02-17 DIAGNOSIS — I472 Ventricular tachycardia, unspecified: Secondary | ICD-10-CM

## 2024-02-17 DIAGNOSIS — T82110A Breakdown (mechanical) of cardiac electrode, initial encounter: Secondary | ICD-10-CM | POA: Insufficient documentation

## 2024-02-17 DIAGNOSIS — Z95810 Presence of automatic (implantable) cardiac defibrillator: Secondary | ICD-10-CM | POA: Diagnosis not present

## 2024-02-17 DIAGNOSIS — I428 Other cardiomyopathies: Secondary | ICD-10-CM | POA: Diagnosis not present

## 2024-02-17 DIAGNOSIS — I5021 Acute systolic (congestive) heart failure: Secondary | ICD-10-CM | POA: Diagnosis not present

## 2024-02-17 NOTE — Anesthesia Postprocedure Evaluation (Signed)
 Anesthesia Post Note  Patient: Thomas [name]  Procedure(s) Performed: LEAD EXTRACTION     Patient location during evaluation: PACU Anesthesia Type: General Level of consciousness: awake and alert Pain management: pain level controlled Vital Signs Assessment: post-procedure vital signs reviewed and stable Respiratory status: spontaneous breathing, nonlabored ventilation, respiratory function stable and patient connected to nasal cannula oxygen Cardiovascular status: blood pressure returned to baseline and stable Postop Assessment: no apparent nausea or vomiting Anesthetic complications: no   No notable events documented.  Last Vitals:  Vitals:   02/17/24 0754 02/17/24 0851  BP: 134/62 134/62  Pulse:    Resp: 16   Temp: 36.6 C   SpO2: 98%     Last Pain:  Vitals:   02/17/24 0850  TempSrc:   PainSc: 0-No pain                 Cordella SQUIBB [name]

## 2024-02-17 NOTE — Progress Notes (Addendum)
 Progress Note  Patient Name: Thomas [name] Date of Encounter: 02/17/2024  Primary Cardiologist: Timothy Gollan, MD   Subjective   No chest pain or sob after extraction and biv ICD.   Inpatient Medications    Scheduled Meds:  amiodarone   200 mg Oral Daily   amLODipine   2.5 mg Oral Daily   metoprolol  succinate  50 mg Oral Daily   rosuvastatin   5 mg Oral QODAY   Continuous Infusions:  PRN Meds: acetaminophen , ondansetron  (ZOFRAN ) IV   Vital Signs    Vitals:   02/17/24 0317 02/17/24 0531 02/17/24 0754 02/17/24 0851  BP: (!) 144/79  134/62 134/62  Pulse:      Resp: 13  16   Temp: 97.8 F (36.6 C)  97.8 F (36.6 C)   TempSrc: Oral  Oral   SpO2:   98%   Weight:  74.6 kg    Height:        Intake/Output Summary (Last 24 hours) at 02/17/2024 1151 Last data filed at 02/17/2024 0900 Gross per 24 hour  Intake 1340 ml  Output 1715 ml  Net -375 ml   Filed Weights   02/16/24 1226 02/17/24 0531  Weight: 72.6 kg 74.6 kg    Telemetry    Nsr with AV pacing - Personally Reviewed  ECG    NSR  - Personally Reviewed. NSR with biv pacing  Physical Exam   GEN: No acute distress.   Neck: No JVD Cardiac: RRR, no murmurs, rubs, or gallops.  Respiratory: Clear to auscultation bilaterally. GI: Soft, nontender, non-distended  MS: No edema; No deformity. Neuro:  Nonfocal  Psych: Normal affect   Labs    Chemistry Recent Labs  Lab 02/16/24 1315  NA 135  K 4.4  CL 99  CO2 22  GLUCOSE 98  BUN 14  CREATININE 1.04  CALCIUM  8.8*  GFRNONAA >60  ANIONGAP 14     Hematology Recent Labs  Lab 02/16/24 1315  WBC 5.2  RBC 4.03*  HGB 13.5  HCT 40.2  MCV 99.8  MCH 33.5  MCHC 33.6  RDW 14.2  PLT 170    Cardiac EnzymesNo results for input(s): TROPONINI in the last 168 hours. No results for input(s): TROPIPOC in the last 168 hours.   BNPNo results for input(s): BNP, PROBNP in the last 168 hours.   DDimer No results for input(s): DDIMER in the last  168 hours.   Radiology    DG Chest 2 View Result Date: 02/17/2024 CLINICAL DATA:  Permanent pacemaker EXAM: CHEST - 2 VIEW COMPARISON:  05/24/2023 FINDINGS: Interval revision of the left-sided permanent pacemaker/AICD. No evidence for pneumothorax or pleural effusion. The cardiopericardial silhouette is within normal limits for size. Telemetry leads overlie the chest. IMPRESSION: Interval revision of left-sided permanent pacemaker/AICD. No evidence for pneumothorax or pleural effusion. Electronically Signed   By: Camellia Candle M.D.   On: 02/17/2024 06:51   EP PPM/ICD IMPLANT Result Date: 02/16/2024 CONCLUSIONS: 1. Extraction of old left bundle branch area pacing lead, implant date 10/21/2022. 2. Successful implantation of new RV ICD and CS leads, upgrade to CRT-D. 3. No early apparent complications. [name] MD 7:31 PM 02/16/2024    Cardiac Studies   none  Patient Profile     78 y.o. male admitted with a h/o worsenin LV function and VT and CHB, s/p upgrade to a Biv ICD.   Assessment & Plan    Chronic systolic heart failure - he appears euvolemic. His meds will uptitrated as an outpatient.  ICD - he is s/p removal of his PPM and RV pacing lead and upgrade to a biv ICD. We will recheck in several months.  Coags - he will need to hold his eliquis  until 02/21/24.  For questions or updates, please contact [facility] Please consult [email or web address] for contact info under Cardiology/STEMI.      Signed, Danelle Birmingham, MD  02/17/2024, 11:51 AM

## 2024-02-17 NOTE — Discharge Summary (Cosign Needed)
 Discharge Summary   Patient ID: [name] MRN: [id number]; DOB: [birth year]  Admit date: 02/16/2024 Discharge date: 02/17/2024  PCP:  Edman Marsa PARAS, DO   [facility] Providers Cardiologist:  Evalene Lunger, MD  Electrophysiologist:  Elspeth Sage, MD    Discharge Diagnoses  Principal Problem:   VT (ventricular tachycardia) [facility]) Active Problems:   Pacemaker lead malfunction  Diagnostic Studies/Procedures   Lead extraction: 02/16/2024  CONCLUSIONS:  1. Extraction of old left bundle branch area pacing lead, implant date 10/21/2022. 2. Successful implantation of new RV ICD and CS leads, upgrade to CRT-D. 3. No early apparent complications.    [name] MD  7:31 PM 02/16/2024 _____________   History of Present Illness   Thomas [name] is a 78 y.o. 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 he has been followed by Dr. Sage as an outpatient.  Hospitalized 05/2023 with syncope and this was felt to be initiated by RVOT PVCs and was started on amiodarone  and discharged with a LifeVest.  Seen back in the clinic and was planning for [facility]MRI at [facility].  Underwent cardiac MRI which showed presence of late gadolinium enhancement, patchy mid and subepicardial in the inferior lateral basal and mid cavity segments. His LifeVest was discontinued after 3 months and see him back in the office 10/2023 by Devere Needle who referred back to Dr. Kitty for ongoing management.  He was seen in the office 10/2023 and discussion was had regarding upgrade of his device to CRT-D.  Recommendations were given about extracting current lead and implanting new lead in the left bundle branch position or CS lead.  Hospital Course    Presented for outpatient lead extraction and upgrade on 10/17 with Dr. Kitty.  Successful removal of PPM and RV pacing lead with upgrade to Abbotts BiV ICD.  No complications noted  overnight.  Chest x-ray the following morning with no evidence for pneumothorax or pleural effusion. Seen by Dr. Waddell and deemed stable for discharge home. Follow up has been arranged in the office per EP. Will plan to resume Eliquis  on 02/21/2024 per Dr. Waddell.  _____________  Discharge Vitals Blood pressure 134/62, pulse 73, temperature 97.8 F (36.6 C), temperature source Oral, resp. rate 16, height 5' 9.5 (1.765 m), weight 74.6 kg, SpO2 98%.  Filed Weights   02/16/24 1226 02/17/24 0531  Weight: 72.6 kg 74.6 kg    Labs & Radiologic Studies  CBC Recent Labs    02/16/24 1315  WBC 5.2  HGB 13.5  HCT 40.2  MCV 99.8  PLT 170   Basic Metabolic Panel Recent Labs    89/82/74 1315  NA 135  K 4.4  CL 99  CO2 22  GLUCOSE 98  BUN 14  CREATININE 1.04  CALCIUM  8.8*   Liver Function Tests No results for input(s): AST, ALT, ALKPHOS, BILITOT, PROT, ALBUMIN in the last 72 hours. No results for input(s): LIPASE, AMYLASE in the last 72 hours. High Sensitivity Troponin:   No results for input(s): TROPONINIHS in the last 720 hours.  No results for input(s): TRNPT in the last 720 hours.  BNP Invalid input(s): POCBNP No results for input(s): PROBNP in the last 72 hours.  No results for input(s): BNP in the last 72 hours.  D-Dimer No results for input(s): DDIMER in the last 72 hours. Hemoglobin A1C No results for input(s): HGBA1C in the last 72 hours. Fasting Lipid Panel No results for  input(s): CHOL, HDL, LDLCALC, TRIG, CHOLHDL, LDLDIRECT in the last 72 hours. Lipoprotein (a)  Date/Time Value Ref Range Status  05/27/2023 08:35 AM 42.4 (H) <75.0 nmol/L Final    Comment:    (NOTE) Note:  Values greater than or equal to 75.0 nmol/L may       indicate an independent risk factor for CHD,       but must be evaluated with caution when applied       to non-Caucasian populations due to the       influence of genetic factors on Lp(a) across        ethnicities. Performed At: [facility] [address] [place], [place] [id number] Jennette Shorter MD Ey:[phone]     Thyroid  Function Tests No results for input(s): TSH, T4TOTAL, T3FREE, THYROIDAB in the last 72 hours.  Invalid input(s): FREET3 _____________  DG Chest 2 View Result Date: 02/17/2024 CLINICAL DATA:  Permanent pacemaker EXAM: CHEST - 2 VIEW COMPARISON:  05/24/2023 FINDINGS: Interval revision of the left-sided permanent pacemaker/AICD. No evidence for pneumothorax or pleural effusion. The cardiopericardial silhouette is within normal limits for size. Telemetry leads overlie the chest. IMPRESSION: Interval revision of left-sided permanent pacemaker/AICD. No evidence for pneumothorax or pleural effusion. Electronically Signed   By: Camellia Candle M.D.   On: 02/17/2024 06:51   EP PPM/ICD IMPLANT Result Date: 02/16/2024 CONCLUSIONS: 1. Extraction of old left bundle branch area pacing lead, implant date 10/21/2022. 2. Successful implantation of new RV ICD and CS leads, upgrade to CRT-D. 3. No early apparent complications. [name] MD 7:31 PM 02/16/2024   CUP PACEART REMOTE DEVICE CHECK Result Date: 01/23/2024 PPM Scheduled remote reviewed. Normal device function.  Presenting rhythm: AP/VP. 11 AMS episodes, longest 4 min, EGMs c/w atrial arrhythmia, V rates controlled, Med list includes Eliquis . Next remote 91 days. MC, CVRS   Disposition Pt is being discharged home today in good condition.  Follow-up Plans & Appointments  Discharge Instructions     Diet - low sodium heart healthy   Complete by: As directed    Increase activity slowly   Complete by: As directed        Discharge Medications Allergies as of 02/17/2024       Reactions   Penicillins Itching, Swelling   Flexeril [cyclobenzaprine] Itching, Swelling   Proscar [finasteride] Other (See Comments)   Dizziness    Flagyl [metronidazole] Other (See Comments)   Abdominal  cramping Bloating    Valium [diazepam] Other (See Comments)   Unknown reaction        Medication List     PAUSE taking these medications    apixaban  5 MG Tabs tablet Wait to take this until: February 21, 2024 Morning Commonly known as: Eliquis  Take 1 tablet (5 mg total) by mouth 2 (two) times daily.       STOP taking these medications    Saw [place]metto  Caps       TAKE these medications    acetaminophen  500 MG tablet Commonly known as: TYLENOL  Take 1,000 mg by mouth 2 (two) times daily as needed for headache or fever (pain).   amiodarone  200 MG tablet Commonly known as: PACERONE  Take 1 tablet by mouth once daily   amLODipine  2.5 MG tablet Commonly known as: NORVASC  Take 1 tablet (2.5 mg total) by mouth daily. May take a second tablet (2.5 MG ) daily as needed for pressure greater then 150.   loratadine  10 MG tablet Commonly known as: CLARITIN  Take 10 mg by mouth  daily as needed for allergies.   metoprolol  succinate 50 MG 24 hr tablet Commonly known as: TOPROL -XL TAKE 1 TABLET BY MOUTH ONCE DAILY .  TAKE  WITH  OR  IMMEDIATELY  FOLLOWING  A  MEAL   multivitamin with minerals tablet Take 1 tablet by mouth daily.   rosuvastatin  5 MG tablet Commonly known as: Crestor  Take 1 tablet (5 mg total) by mouth every other day. At bedtime.         Outstanding Labs/Studies  N/a   Duration of Discharge Encounter: APP Time: 15 minutes   Signed, Manuelita Rummer, NP 02/17/2024, 1:19 PM  EP Attending  Patient seen and examined. ICD interrogation under my direction demonstrates normal device function. CXR looks good. See my rounding note for more details. He is stable for DC. Usual followup.  Danelle Taylor,MD

## 2024-02-17 NOTE — Plan of Care (Signed)

## 2024-02-19 ENCOUNTER — Encounter (HOSPITAL_COMMUNITY): Payer: Self-pay | Admitting: Cardiology

## 2024-02-19 MED FILL — Cefazolin Sodium-Dextrose IV Solution 2 GM/100ML-4%: INTRAVENOUS | Qty: 100 | Status: AC

## 2024-02-19 MED FILL — Gentamicin Sulfate Inj 40 MG/ML: INTRAMUSCULAR | Qty: 80 | Status: AC

## 2024-02-23 ENCOUNTER — Ambulatory Visit: Payer: Self-pay

## 2024-02-23 DIAGNOSIS — Z0000 Encounter for general adult medical examination without abnormal findings: Secondary | ICD-10-CM | POA: Diagnosis not present

## 2024-02-23 NOTE — Patient Instructions (Addendum)
 Thomas [name],  Thank you for taking the time for your Medicare Wellness Visit. I appreciate your continued commitment to your health goals. Please review the care plan we discussed, and feel free to reach out if I can assist you further.  Medicare recommends these wellness visits once per year to help you and your care team stay ahead of potential health issues. These visits are designed to focus on prevention, allowing your provider to concentrate on managing your acute and chronic conditions during your regular appointments.  Please note that Annual Wellness Visits do not include a physical exam. Some assessments may be limited, especially if the visit was conducted virtually. If needed, we may recommend a separate in-person follow-up with your provider.  Ongoing Care Seeing your primary care provider every 3 to 6 months helps us  monitor your health and provide consistent, personalized care.   Referrals If a referral was made during today's visit and you haven't received any updates within two weeks, please contact the referred provider directly to check on the status.  Recommended Screenings:  Health Maintenance  Topic Date Due   DTaP/Tdap/Td vaccine (5 - Td or Tdap) 06/13/2021   Flu Shot  12/01/2023   COVID-19 Vaccine (5 - 2025-26 season) 01/01/2024   Medicare Annual Wellness Visit  02/22/2025   Pneumococcal Vaccine for age over 58  Completed   Zoster (Shingles) Vaccine  Completed   Meningitis B Vaccine  Aged Out   Hepatitis C Screening  Discontinued   Cologuard (Stool DNA test)  Discontinued       02/23/2024    8:15 AM  Advanced Directives  Does Patient Have a Medical Advance Directive? Yes  Type of Advance Directive Living will  Does patient want to make changes to medical advance directive? Yes (Inpatient - patient defers changing a medical advance directive at this time - Information given)   Advance Care Planning is important because it: Ensures you receive medical care  that aligns with your values, goals, and preferences. Provides guidance to your family and loved ones, reducing the emotional burden of decision-making during critical moments.  Vision: Annual vision screenings are recommended for early detection of glaucoma, cataracts, and diabetic retinopathy. These exams can also reveal signs of chronic conditions such as diabetes and high blood pressure.  Dental: Annual dental screenings help detect early signs of oral cancer, gum disease, and other conditions linked to overall health, including heart disease and diabetes.  Please see the attached documents for additional preventive care recommendations.   NEXT AWV 02/28/25 @ 8:10 AM IN PERSON
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
## 2024-02-29 ENCOUNTER — Ambulatory Visit: Attending: Student in an Organized Health Care Education/Training Program

## 2024-02-29 DIAGNOSIS — I442 Atrioventricular block, complete: Secondary | ICD-10-CM

## 2024-02-29 DIAGNOSIS — I472 Ventricular tachycardia, unspecified: Secondary | ICD-10-CM

## 2024-02-29 LAB — CUP PACEART INCLINIC DEVICE CHECK
Battery Remaining Longevity: 70 mo
Brady Statistic RA Percent Paced: 99.97 %
Brady Statistic RV Percent Paced: 99.91 %
Date Time Interrogation Session: 20251030152415
HighPow Impedance: 63 Ohm
Implantable Lead Connection Status: 753985
Implantable Lead Connection Status: 753985
Implantable Lead Connection Status: 753985
Implantable Lead Implant Date: 20240621
Implantable Lead Implant Date: 20251017
Implantable Lead Implant Date: 20251017
Implantable Lead Location: 753858
Implantable Lead Location: 753859
Implantable Lead Location: 753860
Implantable Pulse Generator Implant Date: 20251017
Lead Channel Impedance Value: 387.5 Ohm
Lead Channel Impedance Value: 537.5 Ohm
Lead Channel Impedance Value: 762.5 Ohm
Lead Channel Pacing Threshold Amplitude: 0.5 V
Lead Channel Pacing Threshold Amplitude: 0.5 V
Lead Channel Pacing Threshold Amplitude: 0.75 V
Lead Channel Pacing Threshold Amplitude: 0.75 V
Lead Channel Pacing Threshold Amplitude: 1.75 V
Lead Channel Pacing Threshold Amplitude: 1.75 V
Lead Channel Pacing Threshold Pulse Width: 0.5 ms
Lead Channel Pacing Threshold Pulse Width: 0.5 ms
Lead Channel Pacing Threshold Pulse Width: 0.5 ms
Lead Channel Pacing Threshold Pulse Width: 0.5 ms
Lead Channel Pacing Threshold Pulse Width: 0.5 ms
Lead Channel Pacing Threshold Pulse Width: 0.5 ms
Lead Channel Sensing Intrinsic Amplitude: 2.3 mV
Lead Channel Setting Pacing Amplitude: 1.5 V
Lead Channel Setting Pacing Amplitude: 2 V
Lead Channel Setting Pacing Amplitude: 3.5 V
Lead Channel Setting Pacing Pulse Width: 0.5 ms
Lead Channel Setting Pacing Pulse Width: 0.5 ms
Lead Channel Setting Sensing Sensitivity: 0.5 mV
Pulse Gen Serial Number: 810136810

## 2024-02-29 NOTE — Patient Instructions (Signed)

## 2024-02-29 NOTE — Progress Notes (Signed)
 Normal multi chamber ICD wound check. Wound well healed. Presenting rhythm: AP/BP 73. Routine testing performed. Thresholds, sensing, and impedance consistent with implant measurements with 3.5V safety margin/auto capture until 3 month visit. No treated arrhythmias. Reviewed arm restrictions to continue for 6 weeks total post op. Reviewed shock plan.  Pt enrolled in remote follow-up.

## 2024-03-01 ENCOUNTER — Other Ambulatory Visit: Payer: Self-pay

## 2024-03-03 ENCOUNTER — Ambulatory Visit: Payer: Self-pay | Admitting: Cardiology

## 2024-03-03 NOTE — Patient Instructions (Signed)
 Thank you for allowing the Complex Care Management team to participate in your care. It was great speaking with you.  Keep up the great work managing your care!   Please do not hesitate to contact the clinic if your health or care needs change and you require additional outreach. The care team will gladly assist.   Jackson Acron [facility] RN Care Manager Direct Dial: [phone]  Fax: [phone] Website: [email or web address]
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
## 2024-03-16 ENCOUNTER — Ambulatory Visit: Payer: Self-pay | Admitting: Cardiology

## 2024-03-21 ENCOUNTER — Other Ambulatory Visit: Payer: Self-pay | Admitting: Family Medicine

## 2024-03-21 DIAGNOSIS — E782 Mixed hyperlipidemia: Secondary | ICD-10-CM

## 2024-03-22 NOTE — Telephone Encounter (Signed)
 Requested Prescriptions  Pending Prescriptions Disp Refills   rosuvastatin  (CRESTOR ) 5 MG tablet [Pharmacy Med Name: Rosuvastatin  Calcium  5 MG Oral Tablet] 45 tablet 0    Sig: TAKE 1 TABLET BY MOUTH EVERY OTHER DAY AT BEDTIME     Cardiovascular:  Antilipid - Statins 2 Failed - 03/22/2024  1:42 PM      Failed - Lipid Panel in normal range within the last 12 months    Cholesterol, Total  Date Value Ref Range Status  05/26/2015 141 100 - 199 mg/dL Final   Cholesterol  Date Value Ref Range Status  04/03/2023 135 <200 mg/dL Final   LDL Cholesterol (Calc)  Date Value Ref Range Status  04/03/2023 80 mg/dL (calc) Final    Comment:    Reference range: <100 . Desirable range <100 mg/dL for primary prevention;   <70 mg/dL for patients with CHD or diabetic patients  with > or = 2 CHD risk factors. SABRA LDL-C is now calculated using the Martin-Hopkins  calculation, which is a validated novel method providing  better accuracy than the Friedewald equation in the  estimation of LDL-C.  Gladis APPLETHWAITE et al. SANDREA. 7986;689(80): 2061-2068  (http://education.[facility].com/faq/FAQ164)    HDL  Date Value Ref Range Status  04/03/2023 35 (L) > OR = 40 mg/dL Final  98/75/7982 44 >60 mg/dL Final   Triglycerides  Date Value Ref Range Status  04/03/2023 107 <150 mg/dL Final         Passed - Cr in normal range and within 360 days    Creat  Date Value Ref Range Status  04/03/2023 0.97 0.70 - 1.28 mg/dL Final   Creatinine, Ser  Date Value Ref Range Status  02/16/2024 1.04 0.61 - 1.24 mg/dL Final         Passed - Patient is not pregnant      Passed - Valid encounter within last 12 months    Recent Outpatient Visits           5 months ago Essential hypertension   [facility] Edman Marsa PARAS, DO   9 months ago Syncope, unspecified syncope type   [facility] [place], Marsa PARAS, [place]

## 2024-03-26 ENCOUNTER — Other Ambulatory Visit: Payer: Self-pay | Admitting: Cardiovascular Disease

## 2024-04-01 MED ORDER — METOPROLOL SUCCINATE ER 50 MG PO TB24: 50.0000 mg | ORAL_TABLET | Freq: Every day | ORAL | 2 refills | Status: AC

## 2024-04-17 ENCOUNTER — Encounter: Payer: Self-pay | Admitting: Family Medicine

## 2024-04-17 ENCOUNTER — Ambulatory Visit: Admitting: Family Medicine

## 2024-04-17 VITALS — BP 134/82 | HR 73 | Ht 69.5 in | Wt 171.1 lb

## 2024-04-17 DIAGNOSIS — N400 Enlarged prostate without lower urinary tract symptoms: Secondary | ICD-10-CM | POA: Diagnosis not present

## 2024-04-17 DIAGNOSIS — E538 Deficiency of other specified B group vitamins: Secondary | ICD-10-CM

## 2024-04-17 DIAGNOSIS — I10 Essential (primary) hypertension: Secondary | ICD-10-CM | POA: Diagnosis not present

## 2024-04-17 DIAGNOSIS — E559 Vitamin D deficiency, unspecified: Secondary | ICD-10-CM | POA: Diagnosis not present

## 2024-04-17 DIAGNOSIS — Z95811 Presence of heart assist device: Secondary | ICD-10-CM | POA: Insufficient documentation

## 2024-04-17 DIAGNOSIS — I5020 Unspecified systolic (congestive) heart failure: Secondary | ICD-10-CM | POA: Diagnosis not present

## 2024-04-17 DIAGNOSIS — Z0000 Encounter for general adult medical examination without abnormal findings: Secondary | ICD-10-CM

## 2024-04-17 DIAGNOSIS — R7309 Other abnormal glucose: Secondary | ICD-10-CM | POA: Diagnosis not present

## 2024-04-17 DIAGNOSIS — Z79899 Other long term (current) drug therapy: Secondary | ICD-10-CM | POA: Diagnosis not present

## 2024-04-17 DIAGNOSIS — I4430 Unspecified atrioventricular block: Secondary | ICD-10-CM

## 2024-04-17 DIAGNOSIS — I480 Paroxysmal atrial fibrillation: Secondary | ICD-10-CM | POA: Diagnosis not present

## 2024-04-17 DIAGNOSIS — E782 Mixed hyperlipidemia: Secondary | ICD-10-CM

## 2024-04-17 MED ORDER — ROSUVASTATIN CALCIUM 5 MG PO TABS: 5.0000 mg | ORAL_TABLET | ORAL | 3 refills | Status: AC
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
## 2024-04-17 NOTE — Patient Instructions (Addendum)
 Thank you for coming to the office today.  Refilled Cholesterol medication to the pharmacy, keep the same dose, take one every other day  BP is controlled  Reviewed the recent hospital procedure for the defibrillator/pacer and continue with Cardiology and the medication  Please schedule a Follow-up Appointment to: Return in about 6 months (around 10/16/2024) for 6 month Follow-up / Cardiology updates.  If you have any other questions or concerns, please feel free to call the office or send a message through MyChart. You may also schedule an earlier appointment if necessary.  Additionally, you may be receiving a survey about your experience at our office within a few days to 1 week by e-mail or mail. We value your feedback.  Marsa Officer, DO [facility], [place]

## 2024-04-18 LAB — CBC WITH DIFFERENTIAL/PLATELET
Absolute Lymphocytes: 1454 {cells}/uL (ref 850–3900)
Absolute Monocytes: 413 {cells}/uL (ref 200–950)
Basophils Absolute: 29 {cells}/uL (ref 0–200)
Basophils Relative: 0.6 %
Eosinophils Absolute: 202 {cells}/uL (ref 15–500)
Eosinophils Relative: 4.2 %
HCT: 38.3 % — ABNORMAL LOW (ref 39.4–51.1)
Hemoglobin: 12.9 g/dL — ABNORMAL LOW (ref 13.2–17.1)
MCH: 34.1 pg — ABNORMAL HIGH (ref 27.0–33.0)
MCHC: 33.7 g/dL (ref 31.6–35.4)
MCV: 101.3 fL (ref 81.4–101.7)
MPV: 9.9 fL (ref 7.5–12.5)
Monocytes Relative: 8.6 %
Neutro Abs: 2702 {cells}/uL (ref 1500–7800)
Neutrophils Relative %: 56.3 %
Platelets: 169 Thousand/uL (ref 140–400)
RBC: 3.78 Million/uL — ABNORMAL LOW (ref 4.20–5.80)
RDW: 13.6 % (ref 11.0–15.0)
Total Lymphocyte: 30.3 %
WBC: 4.8 Thousand/uL (ref 3.8–10.8)

## 2024-04-18 LAB — COMPREHENSIVE METABOLIC PANEL WITH GFR
AG Ratio: 1.1 (calc) (ref 1.0–2.5)
ALT: 20 U/L (ref 9–46)
AST: 22 U/L (ref 10–35)
Albumin: 3.8 g/dL (ref 3.6–5.1)
Alkaline phosphatase (APISO): 57 U/L (ref 35–144)
BUN: 20 mg/dL (ref 7–25)
CO2: 27 mmol/L (ref 20–32)
Calcium: 9.1 mg/dL (ref 8.6–10.3)
Chloride: 99 mmol/L (ref 98–110)
Creat: 1.19 mg/dL (ref 0.70–1.22)
Globulin: 3.6 g/dL (ref 1.9–3.7)
Glucose, Bld: 95 mg/dL (ref 65–99)
Potassium: 4.6 mmol/L (ref 3.5–5.3)
Sodium: 137 mmol/L (ref 135–146)
Total Bilirubin: 0.9 mg/dL (ref 0.2–1.2)
Total Protein: 7.4 g/dL (ref 6.1–8.1)
eGFR: 62 mL/min/1.73m2 (ref >=60)

## 2024-04-18 LAB — VITAMIN B12: Vitamin B-12: 295 pg/mL (ref 200–1100)

## 2024-04-18 LAB — HEMOGLOBIN A1C
Hgb A1c MFr Bld: 5.9 % — ABNORMAL HIGH (ref <5.7)
Mean Plasma Glucose: 123 mg/dL
eAG (mmol/L): 6.8 mmol/L

## 2024-04-18 LAB — VITAMIN D 25 HYDROXY (VIT D DEFICIENCY, FRACTURES): Vit D, 25-Hydroxy: 34 ng/mL (ref 30–100)

## 2024-04-18 LAB — TSH: TSH: 1.19 m[IU]/L (ref 0.40–4.50)

## 2024-04-18 LAB — LIPID PANEL
Cholesterol: 118 mg/dL (ref <200)
HDL: 40 mg/dL (ref >=40)
LDL Cholesterol (Calc): 59 mg/dL
Non-HDL Cholesterol (Calc): 78 mg/dL (ref <130)
Total CHOL/HDL Ratio: 3 (calc) (ref <5.0)
Triglycerides: 100 mg/dL (ref <150)

## 2024-04-18 LAB — PSA: PSA: 0.38 ng/mL (ref <=4.00)

## 2024-04-18 LAB — T4, FREE: Free T4: 1.6 ng/dL (ref 0.8–1.8)

## 2024-04-19 ENCOUNTER — Ambulatory Visit: Payer: Self-pay | Admitting: Family Medicine

## 2024-04-19 ENCOUNTER — Other Ambulatory Visit: Payer: Self-pay | Admitting: Family Medicine

## 2024-04-19 MED ORDER — VITAMIN B-12 1000 MCG PO TABS: 1000.0000 ug | ORAL_TABLET | Freq: Every day | ORAL | Status: AC

## 2024-04-23 ENCOUNTER — Ambulatory Visit: Payer: Medicare HMO

## 2024-04-23 DIAGNOSIS — I442 Atrioventricular block, complete: Secondary | ICD-10-CM | POA: Diagnosis not present

## 2024-04-23 LAB — CUP PACEART REMOTE DEVICE CHECK
Battery Remaining Longevity: 72 mo
Battery Remaining Percentage: 92 %
Battery Voltage: 2.99 V
Brady Statistic AP VP Percent: 99 %
Brady Statistic AP VS Percent: 1 %
Brady Statistic AS VP Percent: 1 %
Brady Statistic AS VS Percent: 1 %
Brady Statistic RA Percent Paced: 99 %
Date Time Interrogation Session: 20251223021528
HighPow Impedance: 64 Ohm
Implantable Lead Connection Status: 753985
Implantable Lead Connection Status: 753985
Implantable Lead Connection Status: 753985
Implantable Lead Implant Date: 20240621
Implantable Lead Implant Date: 20251017
Implantable Lead Implant Date: 20251017
Implantable Lead Location: 753858
Implantable Lead Location: 753859
Implantable Lead Location: 753860
Implantable Pulse Generator Implant Date: 20251017
Lead Channel Impedance Value: 360 Ohm
Lead Channel Impedance Value: 500 Ohm
Lead Channel Impedance Value: 850 Ohm
Lead Channel Pacing Threshold Amplitude: 0.625 V
Lead Channel Pacing Threshold Amplitude: 0.75 V
Lead Channel Pacing Threshold Amplitude: 1.75 V
Lead Channel Pacing Threshold Pulse Width: 0.5 ms
Lead Channel Pacing Threshold Pulse Width: 0.5 ms
Lead Channel Pacing Threshold Pulse Width: 0.5 ms
Lead Channel Sensing Intrinsic Amplitude: 2.3 mV
Lead Channel Setting Pacing Amplitude: 1.625
Lead Channel Setting Pacing Amplitude: 2 V
Lead Channel Setting Pacing Amplitude: 3.5 V
Lead Channel Setting Pacing Pulse Width: 0.5 ms
Lead Channel Setting Pacing Pulse Width: 0.5 ms
Lead Channel Setting Sensing Sensitivity: 0.5 mV
Pulse Gen Serial Number: 810136810

## 2024-04-24 NOTE — Progress Notes (Signed)
 Remote PPM Transmission

## 2024-04-28 ENCOUNTER — Ambulatory Visit: Payer: Self-pay | Admitting: Cardiology

## 2024-05-07 ENCOUNTER — Other Ambulatory Visit: Payer: Self-pay | Admitting: Cardiovascular Disease

## 2024-05-20 NOTE — Progress Notes (Unsigned)
 "     Electrophysiology Clinic Note    Date:  05/21/2024  Patient ID:  Thomas [name], Thomas [name] 1946-01-13, MRN [id number] PCP:  Edman Marsa PARAS, DO  Cardiologist:  Evalene Lunger, MD  Electrophysiologist:  [name], MD  Electrophysiology APP:  Santosha Jividen, NP    Discussed the use of AI scribe software for clinical note transcription with the patient, who gave verbal consent to proceed.   Patient Profile    Chief Complaint: Device follow-up  History of Present Illness: Thomas [name] is a 79 y.o. 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  He was hospitalized 05/2023 for VT w syncope on PPM. Dr. Fernande though initiated by RVOT PVCs and was started on amiodarone  and discharged with lifevest. Cardiac MRI showed late gadolinium enhancement with mildly reduced LVEF.  He is now s/p LBBAP lead extraction with implantation of RV ICD lead and CS leads on 01/2024 by Dr. Kitty.   On follow-up today, he feels well without complaints. He denies chest pain, chest pressure, increased SOB. He continues to take amiodarone  200mg  daily without any off-target effects. Continues to take eliquis  BID without bleeding concerns.  He does have a non-healing area on his R nares, has appt with derm soon for evaluation.       Arrhythmia/Device History St. Jude dual chamber PPM, imp 10/2022; dx high grade HB - upgrade to CRT-D w LBBAP extraction w addition of CS and RV ICD lead    AAD -  Amiodarone      ROS:  Please see the history of present illness. All other systems are reviewed and otherwise negative.    Physical Exam    VS:  BP (!) 150/80 (BP Location: Left Arm, Patient Position: Sitting, Cuff Size: Normal)   Pulse 73   Ht 5' 9.5 (1.765 m)   Wt 172 lb (78 kg)   SpO2 97%   BMI 25.04 kg/m  BMI: Body mass index is 25.04  kg/m.           Wt Readings from Last 3 Encounters:  05/21/24 172 lb (78 kg)  04/17/24 171 lb 2 oz (77.6 kg)  02/17/24 164 lb 7.4 oz (74.6 kg)     GEN- The patient is well appearing, alert and oriented x 3 today.   Lungs- Clear to ausculation bilaterally, normal work of breathing.  Heart- Regular rate and rhythm, no murmurs, rubs or gallops Extremities- No peripheral edema, warm, dry Skin-  device pocket well-healed, no tethering   Device interrogation done today and reviewed by myself:  Battery 6-7 years Lead thresholds, impedence, sensing stable  Dependent down to VVI 40 VP 99% Several brief AF episodes, overall burden < 1% HR histograms blunted at LRL Turned ON LV auto-threshold    Studies Reviewed   Previous EP, cardiology notes.    EKG is ordered. Personal review of EKG from today shows:    EKG Interpretation Date/Time:  Tuesday May 21 2024 13:22:14 EST Ventricular Rate:  73 PR Interval:    QRS Duration:  164 QT Interval:  492 QTC Calculation: 542 R Axis:   262  Text Interpretation: AV dual-paced rhythm Biventricular pacemaker detected Confirmed by Lamon Rotundo [phone]) on 05/21/2024 1:27:13 PM    Cardiac MRI, 09/19/2023 (via [facility]) Mildly decreased left ventricular resting systolic function with EF of 47%. Low normal right  ventricular ejection fraction of 47%. No regional wall motion abnormality.   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 Other etiologies like as sarcoidosis in the Fabry disease are less likely.   Mild aortic and mitral regurgitation.    LHC, 05/26/2023   2nd Mrg lesion is 40% stenosed.   RPDA lesion is 20% stenosed.   TTE, 05/25/2023  1. Left ventricular ejection fraction, by estimation, is 55 to 60%. Left ventricular ejection fraction by 2D MOD biplane is 54.1 %. The left ventricle has  normal function. The left ventricle demonstrates regional wall motion abnormalities (septal wall motion consistent with conduction abnormality). Left ventricular diastolic parameters are consistent with Grade I diastolic dysfunction (impaired relaxation).   2. Right ventricular systolic function is normal. The right ventricular size is normal. There is mildly elevated pulmonary artery systolic pressure. The estimated right ventricular systolic pressure is 38.5 mmHg.   3. The mitral valve is normal in structure. Mild mitral valve regurgitation. No evidence of mitral stenosis.   4. The aortic valve is normal in structure. Aortic valve regurgitation is mild. No aortic stenosis is present.   5. The inferior vena cava is normal in size with greater than 50% respiratory variability, suggesting right atrial pressure of 3 mmHg.   6. Frequent PVCs noted.    Assessment and Plan     #) PVC #) VT #) amiodarone  monitoring Quiescent on amiodarone . We briefly talked about continuing amio vs stopping, he prefers to continue at this time Recent LFT, thyroid  labs stable Continue 200mg  amiodarone  daily Continue 50mg  toprol  daily  #) NICM s/p CRT-D Device functioning well, see Paceart for details No sustained arrhythmias noted VP 99% Histograms blunted at lower rate limit, patient asymptomatic with no functional limitations, continue to monitor via remotes  #) parox AFib Overall low burden Patient asymptomatic of episodes Continue amiodarone  as above  #) Hypercoag d/t parox afib CHA2DS2-VASc Score = at least 4 [CHF History: 0, HTN History: 1, Diabetes History: 0, Stroke History: 0, Vascular Disease History: 1, Age Score: 2, Gender Score: 0].  Therefore, the patient's annual risk of stroke is 4.8 %.    Stroke ppx - 5mg  eliquis  BID, appropriately dosed No bleeding concerns  #) HFmrEF Recent cMRI with mid-range LVEF Now s/p device upgrade Will update TTE       Current medicines are reviewed at  length with the patient today.   The patient does not have concerns regarding his medicines.  The following changes were made today:  none  Labs/ tests ordered today include:  Orders Placed This Encounter  Procedures   EKG 12-Lead   ECHOCARDIOGRAM COMPLETE     Disposition: Follow up with Dr. Kennyth or EP APP in 6 months, continue remote monitoring  Follow-up with Dr. Perla in ~3 months   Signed, Rishon Thilges, NP  05/21/24  4:01 PM  Electrophysiology [facility] "

## 2024-05-21 ENCOUNTER — Ambulatory Visit: Attending: Cardiology | Admitting: Cardiology

## 2024-05-21 ENCOUNTER — Encounter: Payer: Self-pay | Admitting: Cardiology

## 2024-05-21 VITALS — BP 150/80 | HR 73 | Ht 69.5 in | Wt 172.0 lb

## 2024-05-21 DIAGNOSIS — I493 Ventricular premature depolarization: Secondary | ICD-10-CM

## 2024-05-21 DIAGNOSIS — I480 Paroxysmal atrial fibrillation: Secondary | ICD-10-CM

## 2024-05-21 DIAGNOSIS — D6869 Other thrombophilia: Secondary | ICD-10-CM

## 2024-05-21 DIAGNOSIS — I472 Ventricular tachycardia, unspecified: Secondary | ICD-10-CM | POA: Diagnosis not present

## 2024-05-21 DIAGNOSIS — Z5181 Encounter for therapeutic drug level monitoring: Secondary | ICD-10-CM | POA: Diagnosis not present

## 2024-05-21 DIAGNOSIS — Z95810 Presence of automatic (implantable) cardiac defibrillator: Secondary | ICD-10-CM

## 2024-05-21 DIAGNOSIS — Z79899 Other long term (current) drug therapy: Secondary | ICD-10-CM | POA: Diagnosis not present

## 2024-05-21 NOTE — Patient Instructions (Signed)
 Medication Instructions:  Your physician recommends that you continue on your current medications as directed. Please refer to the Current Medication list given to you today.  *If you need a refill on your cardiac medications before your next appointment, please call your pharmacy*  Lab Work: No labs ordered today    Testing/Procedures: Your physician has requested that you have an echocardiogram. Echocardiography is a painless test that uses sound waves to create images of your heart. It provides your doctor with information about the size and shape of your heart and how well your hearts chambers and valves are working.   You may receive an ultrasound enhancing agent through an IV if needed to better visualize your heart during the echo. This procedure takes approximately one hour.  There are no restrictions for this procedure.  This will take place at [address] [facility]) [address], [place] [address]  Please note: We ask at that you not bring children with you during ultrasound (echo/ vascular) testing. Due to room size and safety concerns, children are not allowed in the ultrasound rooms during exams. Our front office staff cannot provide observation of children in our lobby area while testing is being conducted. An adult accompanying a patient to their appointment will only be allowed in the ultrasound room at the discretion of the ultrasound technician under special circumstances. We apologize for any inconvenience.   Follow-Up: At [facility], you and your health needs are our priority.  As part of our continuing mission to provide you with exceptional heart care, our providers are all part of one team.  This team includes your primary Cardiologist (physician) and Advanced Practice Providers or APPs (Physician Assistants and Nurse Practitioners) who all work together to provide you with the care you need, when you need it.  Dr. Gollan follow up in 3  months.   Your next appointment:   5 month(s)  Provider:   Suzann Riddle, NP

## 2024-07-01 ENCOUNTER — Ambulatory Visit

## 2024-08-19 ENCOUNTER — Ambulatory Visit: Admitting: Cardiovascular Disease

## 2024-10-17 ENCOUNTER — Ambulatory Visit: Admitting: Family Medicine

## 2024-10-21 ENCOUNTER — Ambulatory Visit: Admitting: Cardiology

## 2025-02-28 ENCOUNTER — Ambulatory Visit
